# Patient Record
Sex: Male | Born: 1945 | Race: White | Hispanic: No
Health system: Southern US, Community
[De-identification: ages and names within clinical notes are randomized; demographics above are authoritative.]

## PROBLEM LIST (undated history)

## (undated) DIAGNOSIS — Z794 Long term (current) use of insulin: Secondary | ICD-10-CM

## (undated) DIAGNOSIS — I2510 Atherosclerotic heart disease of native coronary artery without angina pectoris: Secondary | ICD-10-CM

## (undated) DIAGNOSIS — Z95810 Presence of automatic (implantable) cardiac defibrillator: Secondary | ICD-10-CM

## (undated) DIAGNOSIS — E119 Type 2 diabetes mellitus without complications: Secondary | ICD-10-CM

---

## 2020-05-12 ENCOUNTER — Emergency Department: Payer: No Typology Code available for payment source

## 2020-05-12 ENCOUNTER — Inpatient Hospital Stay
Admission: EM | Admit: 2020-05-12 | Discharge: 2020-06-01 | DRG: 870 | Disposition: E | Payer: No Typology Code available for payment source | Attending: Internal Medicine | Admitting: Internal Medicine

## 2020-05-12 ENCOUNTER — Other Ambulatory Visit: Payer: Self-pay

## 2020-05-12 DIAGNOSIS — I361 Nonrheumatic tricuspid (valve) insufficiency: Secondary | ICD-10-CM | POA: Diagnosis not present

## 2020-05-12 DIAGNOSIS — R4182 Altered mental status, unspecified: Secondary | ICD-10-CM

## 2020-05-12 DIAGNOSIS — K567 Ileus, unspecified: Secondary | ICD-10-CM | POA: Diagnosis not present

## 2020-05-12 DIAGNOSIS — K922 Gastrointestinal hemorrhage, unspecified: Secondary | ICD-10-CM

## 2020-05-12 DIAGNOSIS — A4102 Sepsis due to Methicillin resistant Staphylococcus aureus: Principal | ICD-10-CM | POA: Diagnosis present

## 2020-05-12 DIAGNOSIS — E875 Hyperkalemia: Secondary | ICD-10-CM | POA: Diagnosis not present

## 2020-05-12 DIAGNOSIS — E876 Hypokalemia: Secondary | ICD-10-CM | POA: Diagnosis not present

## 2020-05-12 DIAGNOSIS — Z7189 Other specified counseling: Secondary | ICD-10-CM | POA: Diagnosis not present

## 2020-05-12 DIAGNOSIS — I130 Hypertensive heart and chronic kidney disease with heart failure and stage 1 through stage 4 chronic kidney disease, or unspecified chronic kidney disease: Secondary | ICD-10-CM | POA: Diagnosis present

## 2020-05-12 DIAGNOSIS — E870 Hyperosmolality and hypernatremia: Secondary | ICD-10-CM | POA: Diagnosis not present

## 2020-05-12 DIAGNOSIS — I4892 Unspecified atrial flutter: Secondary | ICD-10-CM | POA: Diagnosis not present

## 2020-05-12 DIAGNOSIS — M898X9 Other specified disorders of bone, unspecified site: Secondary | ICD-10-CM | POA: Diagnosis present

## 2020-05-12 DIAGNOSIS — Z66 Do not resuscitate: Secondary | ICD-10-CM | POA: Diagnosis not present

## 2020-05-12 DIAGNOSIS — R6521 Severe sepsis with septic shock: Secondary | ICD-10-CM | POA: Diagnosis present

## 2020-05-12 DIAGNOSIS — A4101 Sepsis due to Methicillin susceptible Staphylococcus aureus: Principal | ICD-10-CM | POA: Diagnosis present

## 2020-05-12 DIAGNOSIS — J9602 Acute respiratory failure with hypercapnia: Secondary | ICD-10-CM | POA: Diagnosis present

## 2020-05-12 DIAGNOSIS — D6489 Other specified anemias: Secondary | ICD-10-CM | POA: Diagnosis not present

## 2020-05-12 DIAGNOSIS — J15212 Pneumonia due to Methicillin resistant Staphylococcus aureus: Secondary | ICD-10-CM | POA: Diagnosis present

## 2020-05-12 DIAGNOSIS — E1110 Type 2 diabetes mellitus with ketoacidosis without coma: Secondary | ICD-10-CM | POA: Diagnosis not present

## 2020-05-12 DIAGNOSIS — Z95810 Presence of automatic (implantable) cardiac defibrillator: Secondary | ICD-10-CM

## 2020-05-12 DIAGNOSIS — R401 Stupor: Secondary | ICD-10-CM | POA: Diagnosis not present

## 2020-05-12 DIAGNOSIS — N2581 Secondary hyperparathyroidism of renal origin: Secondary | ICD-10-CM | POA: Diagnosis present

## 2020-05-12 DIAGNOSIS — Z20822 Contact with and (suspected) exposure to covid-19: Secondary | ICD-10-CM | POA: Diagnosis present

## 2020-05-12 DIAGNOSIS — I484 Atypical atrial flutter: Secondary | ICD-10-CM | POA: Diagnosis not present

## 2020-05-12 DIAGNOSIS — R680 Hypothermia, not associated with low environmental temperature: Secondary | ICD-10-CM | POA: Diagnosis present

## 2020-05-12 DIAGNOSIS — I248 Other forms of acute ischemic heart disease: Secondary | ICD-10-CM | POA: Diagnosis not present

## 2020-05-12 DIAGNOSIS — G9340 Encephalopathy, unspecified: Secondary | ICD-10-CM | POA: Diagnosis not present

## 2020-05-12 DIAGNOSIS — R6520 Severe sepsis without septic shock: Secondary | ICD-10-CM | POA: Diagnosis not present

## 2020-05-12 DIAGNOSIS — E1111 Type 2 diabetes mellitus with ketoacidosis with coma: Secondary | ICD-10-CM | POA: Diagnosis present

## 2020-05-12 DIAGNOSIS — E1122 Type 2 diabetes mellitus with diabetic chronic kidney disease: Secondary | ICD-10-CM | POA: Diagnosis present

## 2020-05-12 DIAGNOSIS — D62 Acute posthemorrhagic anemia: Secondary | ICD-10-CM | POA: Diagnosis present

## 2020-05-12 DIAGNOSIS — J9601 Acute respiratory failure with hypoxia: Secondary | ICD-10-CM | POA: Diagnosis present

## 2020-05-12 DIAGNOSIS — A419 Sepsis, unspecified organism: Secondary | ICD-10-CM

## 2020-05-12 DIAGNOSIS — I252 Old myocardial infarction: Secondary | ICD-10-CM

## 2020-05-12 DIAGNOSIS — I469 Cardiac arrest, cause unspecified: Secondary | ICD-10-CM | POA: Diagnosis not present

## 2020-05-12 DIAGNOSIS — N189 Chronic kidney disease, unspecified: Secondary | ICD-10-CM | POA: Diagnosis present

## 2020-05-12 DIAGNOSIS — N170 Acute kidney failure with tubular necrosis: Secondary | ICD-10-CM | POA: Diagnosis not present

## 2020-05-12 DIAGNOSIS — K921 Melena: Secondary | ICD-10-CM | POA: Diagnosis not present

## 2020-05-12 DIAGNOSIS — D649 Anemia, unspecified: Secondary | ICD-10-CM | POA: Diagnosis not present

## 2020-05-12 DIAGNOSIS — R7881 Bacteremia: Secondary | ICD-10-CM

## 2020-05-12 DIAGNOSIS — I5021 Acute systolic (congestive) heart failure: Secondary | ICD-10-CM | POA: Diagnosis not present

## 2020-05-12 DIAGNOSIS — G928 Other toxic encephalopathy: Secondary | ICD-10-CM | POA: Diagnosis not present

## 2020-05-12 DIAGNOSIS — J9600 Acute respiratory failure, unspecified whether with hypoxia or hypercapnia: Secondary | ICD-10-CM

## 2020-05-12 DIAGNOSIS — I483 Typical atrial flutter: Secondary | ICD-10-CM | POA: Diagnosis not present

## 2020-05-12 DIAGNOSIS — N179 Acute kidney failure, unspecified: Secondary | ICD-10-CM | POA: Diagnosis present

## 2020-05-12 DIAGNOSIS — J9690 Respiratory failure, unspecified, unspecified whether with hypoxia or hypercapnia: Secondary | ICD-10-CM

## 2020-05-12 DIAGNOSIS — R195 Other fecal abnormalities: Secondary | ICD-10-CM | POA: Diagnosis not present

## 2020-05-12 DIAGNOSIS — D631 Anemia in chronic kidney disease: Secondary | ICD-10-CM | POA: Diagnosis present

## 2020-05-12 DIAGNOSIS — E871 Hypo-osmolality and hyponatremia: Secondary | ICD-10-CM | POA: Diagnosis not present

## 2020-05-12 DIAGNOSIS — U071 COVID-19: Secondary | ICD-10-CM | POA: Diagnosis not present

## 2020-05-12 DIAGNOSIS — R0902 Hypoxemia: Secondary | ICD-10-CM

## 2020-05-12 DIAGNOSIS — E1165 Type 2 diabetes mellitus with hyperglycemia: Secondary | ICD-10-CM

## 2020-05-12 DIAGNOSIS — Z515 Encounter for palliative care: Secondary | ICD-10-CM | POA: Diagnosis not present

## 2020-05-12 DIAGNOSIS — Z6841 Body Mass Index (BMI) 40.0 and over, adult: Secondary | ICD-10-CM | POA: Diagnosis not present

## 2020-05-12 DIAGNOSIS — I340 Nonrheumatic mitral (valve) insufficiency: Secondary | ICD-10-CM | POA: Diagnosis not present

## 2020-05-12 DIAGNOSIS — D72829 Elevated white blood cell count, unspecified: Secondary | ICD-10-CM | POA: Diagnosis not present

## 2020-05-12 DIAGNOSIS — Z0189 Encounter for other specified special examinations: Secondary | ICD-10-CM

## 2020-05-12 DIAGNOSIS — G4733 Obstructive sleep apnea (adult) (pediatric): Secondary | ICD-10-CM | POA: Diagnosis present

## 2020-05-12 DIAGNOSIS — R1110 Vomiting, unspecified: Secondary | ICD-10-CM

## 2020-05-12 DIAGNOSIS — I2510 Atherosclerotic heart disease of native coronary artery without angina pectoris: Secondary | ICD-10-CM | POA: Diagnosis present

## 2020-05-12 DIAGNOSIS — R4020 Unspecified coma: Secondary | ICD-10-CM | POA: Diagnosis not present

## 2020-05-12 DIAGNOSIS — J189 Pneumonia, unspecified organism: Secondary | ICD-10-CM

## 2020-05-12 DIAGNOSIS — E0811 Diabetes mellitus due to underlying condition with ketoacidosis with coma: Secondary | ICD-10-CM | POA: Diagnosis not present

## 2020-05-12 DIAGNOSIS — B9561 Methicillin susceptible Staphylococcus aureus infection as the cause of diseases classified elsewhere: Secondary | ICD-10-CM | POA: Diagnosis not present

## 2020-05-12 DIAGNOSIS — Z01818 Encounter for other preprocedural examination: Secondary | ICD-10-CM

## 2020-05-12 DIAGNOSIS — J15211 Pneumonia due to Methicillin susceptible Staphylococcus aureus: Secondary | ICD-10-CM | POA: Diagnosis present

## 2020-05-12 DIAGNOSIS — E1100 Type 2 diabetes mellitus with hyperosmolarity without nonketotic hyperglycemic-hyperosmolar coma (NKHHC): Secondary | ICD-10-CM

## 2020-05-12 HISTORY — DX: Presence of automatic (implantable) cardiac defibrillator: Z95.810

## 2020-05-12 HISTORY — DX: Long term (current) use of insulin: Z79.4

## 2020-05-12 HISTORY — DX: Type 2 diabetes mellitus without complications: E11.9

## 2020-05-12 HISTORY — DX: Atherosclerotic heart disease of native coronary artery without angina pectoris: I25.10

## 2020-05-12 LAB — CBC WITH DIFFERENTIAL/PLATELET
Abs Immature Granulocytes: 0.69 10*3/uL — ABNORMAL HIGH (ref 0.00–0.07)
Basophils Absolute: 0 10*3/uL (ref 0.0–0.1)
Basophils Relative: 0 %
Eosinophils Absolute: 0 10*3/uL (ref 0.0–0.5)
Eosinophils Relative: 0 %
HCT: 22 % — ABNORMAL LOW (ref 39.0–52.0)
Hemoglobin: 7.1 g/dL — ABNORMAL LOW (ref 13.0–17.0)
Immature Granulocytes: 3 %
Lymphocytes Relative: 9 %
Lymphs Abs: 2.2 10*3/uL (ref 0.7–4.0)
MCH: 32.1 pg (ref 26.0–34.0)
MCHC: 32.3 g/dL (ref 30.0–36.0)
MCV: 99.5 fL (ref 80.0–100.0)
Monocytes Absolute: 1.6 10*3/uL — ABNORMAL HIGH (ref 0.1–1.0)
Monocytes Relative: 6 %
Neutro Abs: 21.6 10*3/uL — ABNORMAL HIGH (ref 1.7–7.7)
Neutrophils Relative %: 82 %
Platelets: 227 10*3/uL (ref 150–400)
RBC: 2.21 MIL/uL — ABNORMAL LOW (ref 4.22–5.81)
RDW: 14.6 % (ref 11.5–15.5)
Smear Review: NORMAL
WBC: 26.2 10*3/uL — ABNORMAL HIGH (ref 4.0–10.5)
nRBC: 0 % (ref 0.0–0.2)

## 2020-05-12 LAB — BLOOD GAS, VENOUS
Acid-base deficit: 13.3 mmol/L — ABNORMAL HIGH (ref 0.0–2.0)
Bicarbonate: 13.8 mmol/L — ABNORMAL LOW (ref 20.0–28.0)
O2 Saturation: 23.8 %
Patient temperature: 37
pCO2, Ven: 36 mmHg — ABNORMAL LOW (ref 44.0–60.0)
pH, Ven: 7.19 — CL (ref 7.250–7.430)
pO2, Ven: <31 mmHg — CL (ref 32.0–45.0)

## 2020-05-12 LAB — POC SARS CORONAVIRUS 2 AG -  ED: SARS Coronavirus 2 Ag: NEGATIVE

## 2020-05-12 LAB — APTT: aPTT: 25 seconds (ref 24–36)

## 2020-05-12 LAB — CBG MONITORING, ED
Glucose-Capillary: >600 mg/dL (ref 70–99)
Glucose-Capillary: >600 mg/dL (ref 70–99)

## 2020-05-12 LAB — LACTIC ACID, PLASMA: Lactic Acid, Venous: 5.8 mmol/L (ref 0.5–1.9)

## 2020-05-12 LAB — PROTIME-INR
INR: 1.4 — ABNORMAL HIGH (ref 0.8–1.2)
Prothrombin Time: 16.6 seconds — ABNORMAL HIGH (ref 11.4–15.2)

## 2020-05-12 MED ORDER — LACTATED RINGERS IV SOLN: INTRAVENOUS | Status: DC

## 2020-05-12 MED ORDER — METRONIDAZOLE IN NACL 5-0.79 MG/ML-% IV SOLN
500.0000 mg | Freq: Once | INTRAVENOUS | Status: AC
  Administered 2020-05-13: 500 mg via INTRAVENOUS
  Filled 2020-05-12: qty 100

## 2020-05-12 MED ORDER — VANCOMYCIN HCL 1500 MG/300ML IV SOLN
1500.0000 mg | Freq: Once | INTRAVENOUS | Status: AC
  Administered 2020-05-13: 1500 mg via INTRAVENOUS
  Filled 2020-05-12: qty 300

## 2020-05-12 MED ORDER — INSULIN REGULAR(HUMAN) IN NACL 100-0.9 UT/100ML-% IV SOLN
INTRAVENOUS | Status: DC
  Administered 2020-05-12: 15 [IU]/h via INTRAVENOUS
  Administered 2020-05-13: 8 [IU]/h via INTRAVENOUS
  Administered 2020-05-13: 15 [IU]/h via INTRAVENOUS
  Filled 2020-05-12 (×3): qty 100

## 2020-05-12 MED ORDER — DOCUSATE SODIUM 100 MG PO CAPS: 100.0000 mg | ORAL_CAPSULE | Freq: Two times a day (BID) | ORAL | Status: DC | PRN

## 2020-05-12 MED ORDER — VANCOMYCIN HCL IN DEXTROSE 1-5 GM/200ML-% IV SOLN: 1000.0000 mg | Freq: Once | INTRAVENOUS | Status: DC

## 2020-05-12 MED ORDER — DEXTROSE 50 % IV SOLN
0.0000 mL | INTRAVENOUS | Status: DC | PRN
  Administered 2020-05-16: 25 mL via INTRAVENOUS
  Administered 2020-05-22: 50 mL via INTRAVENOUS
  Filled 2020-05-12 (×2): qty 50

## 2020-05-12 MED ORDER — SODIUM CHLORIDE 0.9 % IV BOLUS
1000.0000 mL | Freq: Once | INTRAVENOUS | Status: AC
  Administered 2020-05-12: 1000 mL via INTRAVENOUS

## 2020-05-12 MED ORDER — SODIUM CHLORIDE 0.9 % IV SOLN
8.0000 mg/h | INTRAVENOUS | Status: DC
  Administered 2020-05-13: 01:00:00 8 mg/h via INTRAVENOUS
  Filled 2020-05-12: qty 80

## 2020-05-12 MED ORDER — VANCOMYCIN HCL IN DEXTROSE 1-5 GM/200ML-% IV SOLN
1000.0000 mg | Freq: Once | INTRAVENOUS | Status: DC
  Filled 2020-05-12: qty 200

## 2020-05-12 MED ORDER — POLYETHYLENE GLYCOL 3350 17 G PO PACK: 17.0000 g | PACK | Freq: Every day | ORAL | Status: DC | PRN

## 2020-05-12 MED ORDER — SODIUM CHLORIDE 0.9 % IV SOLN
2.0000 g | Freq: Once | INTRAVENOUS | Status: AC
  Administered 2020-05-12: 2 g via INTRAVENOUS
  Filled 2020-05-12: qty 2

## 2020-05-12 MED ORDER — SODIUM CHLORIDE 0.9 % IV SOLN: 10.0000 mL/h | Freq: Once | INTRAVENOUS | Status: DC

## 2020-05-12 MED ORDER — SODIUM CHLORIDE 0.9 % IV BOLUS
1000.0000 mL | Freq: Once | INTRAVENOUS | Status: AC
  Administered 2020-05-13: 1000 mL via INTRAVENOUS

## 2020-05-12 MED ORDER — SODIUM CHLORIDE 0.9 % IV SOLN
80.0000 mg | Freq: Once | INTRAVENOUS | Status: AC
  Administered 2020-05-13: 80 mg via INTRAVENOUS
  Filled 2020-05-12: qty 80

## 2020-05-12 MED ORDER — DEXTROSE IN LACTATED RINGERS 5 % IV SOLN: INTRAVENOUS | Status: DC

## 2020-05-12 MED ORDER — HEPARIN SODIUM (PORCINE) 5000 UNIT/ML IJ SOLN: 5000.0000 [IU] | Freq: Three times a day (TID) | INTRAMUSCULAR | Status: DC

## 2020-05-12 NOTE — H&P (Signed)
NAME:  Billy Barton, MRN:  [id number], DOB:  1945-06-02, LOS: 0 ADMISSION DATE:  06/03/20, CONSULTATION DATE:  June 03, 2020 REFERRING MD:  ED , CHIEF COMPLAINT:  Unresponsive   Brief History:  75yo male w/unknown past medical history who presented to the ED w/AMS and was found to be in DKA vs HSS with a blood sugar >600 and lactate 5.8  History of Present Illness:  75yo male w/unknown past medical history who presented to the ED via EMS after the son found him unresponsive, face down in the kitchen, incontinent of urine. Per the ED, the patient son reported to them they call and check on him daily and last spoke with him yesterday. When EMS arrived, the patient was awake but became unresponsive en route to the ED.   On arrival to the ED, the patient was arousable only to painful stimuli, hypothermic 94 otherwise stable vital signs. Labs revealed a blood glucose > 600, lactate 5.8, WBC 26.2, Hgb 7.1 and VBG: 7.19/36 and bicarb 13. He was given 1L LR bolus, started on endotool and antibiotics were initiated. Rapid COVID test negative  ICU was called for admission.   Past Medical History:  Unknown at this time, will need to speak with family when they arrive  Significant Hospital Events:  1/13-Admitted to ICU  Consults:  None  Procedures:  None   Significant Diagnostic Tests:  Rapid COVID negative  Micro Data:  Blood cultures sent 1/13: Pending  Antimicrobials:  Cefepime>>1/13 Flagyl>>1.13 Vancomycin >>1/13  Interim History / Subjective:  Arousable to voice and able to say name Confused Hypothermic w/bear hugger on VSS  Objective   Blood pressure (!) 136/52, pulse 71, temperature (!) 94.7 F (34.8 C), temperature source Rectal, resp. rate (!) 23, height 5\' 8"  (1.727 m), weight 127 kg, SpO2 97 %.       No intake or output data in the 24 hours ending 2020-06-03 2357 Filed Weights   06/03/20 2206  Weight: 127 kg    Examination: General: Lethargic but arousable HENT:  dry oral mucosa, normocephalic Lungs: Lungs clear throughout Cardiovascular: Heart sounds S1S2, +1 pitting edema Abdomen: soft, non-distended Extremities: warm, palpable pulses Neuro: arouses to voice, states his name, follows commands, confused to situation GU: No urine output yet. Foley to be placed  Resolved Hospital Problem list     Assessment & Plan:  Diabetic Ketoacidosis vs HSS --Blood glucose 1338 via BMP --UA pending --Received 1L LR in ED --Additional 1L LR given --LR at 150cc, add dextrose once BS <250 --Endotool --BMP, Lactates Q4hr --Monitor lytes  Metabolic acidosis --2/2 above --Trend ABG/Lactates --Fluids given --Will hold off on Bicarb administration for now  Hyperkalemia --K 6.7 --Receiving IV insulin at 15units/hr via endotool --Received IVFs --Trending BMP q4hr --No EKG changes at this time  Acute Kidney Injury --in the setting of HSS --Baseline Cr unknown --Cr 4.34 on admission --Continue aggressive fluid replacement --Avoid nephrotoxins --Monitor I&Os, place foley  Acute blood loss anemia --hgb 7.1 on arrival --Per ED, the did a stool guiac which was positive --ED ordered 2units of PRBC and started protonix gtt --Serial CBCs --Monitor for now, if starts to have active bleeding will need to consult GI  Leukocytosis --WBC 26.2 --Likely reactive --Given hypothermia and elevated lactate, patient was started on antibiotics in ED --Blood cultures pending --UA pending --De-escalate abxs pending clinical course and cultures    Best practice (evaluated daily)  Diet: NPO Pain/Anxiety/Delirium protocol (if indicated): N/A VAP protocol (if indicated): N/A DVT prophylaxis:  SCDs only, hold chemical prophylaxis given Acute blood loss anemia GI prophylaxis: Protonix gtt Glucose control: Endotool Mobility: PT consult in AM Disposition:ICU  Goals of Care:  Last date of multidisciplinary goals of care discussion:N/A Family and staff present:  N/A Summary of discussion: N/A Follow up goals of care discussion due: N/A Code Status: Full  Labs   CBC: Recent Labs  Lab 05/17/2020 2222  WBC 26.2*  NEUTROABS 21.6*  HGB 7.1*  HCT 22.0*  MCV 99.5  PLT 227    Basic Metabolic Panel: No results for input(s): NA, K, CL, CO2, GLUCOSE, BUN, CREATININE, CALCIUM, MG, PHOS in the last 168 hours. GFR: CrCl cannot be calculated (No successful lab value found.). Recent Labs  Lab 05/10/2020 2222  WBC 26.2*  LATICACIDVEN 5.8*    Liver Function Tests: No results for input(s): AST, ALT, ALKPHOS, BILITOT, PROT, ALBUMIN in the last 168 hours. No results for input(s): LIPASE, AMYLASE in the last 168 hours. No results for input(s): AMMONIA in the last 168 hours.  ABG    Component Value Date/Time   HCO3 13.8 (L) May 31, 2020 2222   ACIDBASEDEF 13.3 (H) May 31, 2020 2222   O2SAT 23.8 05/20/2020 2222     Coagulation Profile: Recent Labs  Lab 05/04/2020 2222  INR 1.4*    Cardiac Enzymes: No results for input(s): CKTOTAL, CKMB, CKMBINDEX, TROPONINI in the last 168 hours.  HbA1C: No results found for: HGBA1C  CBG: Recent Labs  Lab 05/17/2020 2211 May 31, 2020 2335  GLUCAP >600* >600*    Review of Systems:   Unable to assess due to clinical condition  Past Medical History:  He,  has no past medical history on file.   Surgical History:  Unknown   Social History:      Family History:  His family history is not on file.   Allergies Not on File   Home Medications  Prior to Admission medications   Not on File     Critical care time: 51 minutes

## 2020-05-12 NOTE — Sepsis Progress Note (Signed)
Following for sepsis monitoring ?

## 2020-05-12 NOTE — ED Triage Notes (Signed)
PT brought in by [facility] from home. Home states he was found unresponsive 1 hr pta by son. Last time he spoke to him was light night. He as found face down in kitchen and had been incontinent of urine. Per ems pt was awake but became unresponsive enroute. CBG high per EMS hx of diabetes.

## 2020-05-12 NOTE — H&P (Incomplete)
NAME:  Billy Barton, MRN:  [id number], DOB:  June 09, 1945, LOS: 0 ADMISSION DATE:  05/16/2020, CONSULTATION DATE:   REFERRING MD:  ***, CHIEF COMPLAINT:  ***   Brief History:  75yo male w/unknown past medical history who presented to the ED w/AMS and was found to be in DKA with a blood sugar >600 and lactate 5.8  History of Present Illness:  75yo male w/unknown past medical history who presented to the ED via EMS after the son found him unresponsive, face down in the kitchen, incontinent of urine. Per the ED, the patient son reported to them they call and check on him daily and last spoke with him yesterday. When EMS arrived, the patient was awake but became unresponsive en route to the ED.   On arrival to the ED, the patient was arousable only to painful stimuli, hypothermic  Past Medical History:  ***  Significant Hospital Events:  ***  Consults:  ***  Procedures:  ***  Significant Diagnostic Tests:  ***  Micro Data:  ***  Antimicrobials:  ***   Interim History / Subjective:  ***  Objective   Blood pressure (!) 136/52, pulse 71, temperature (!) 94.7 F (34.8 C), temperature source Rectal, resp. rate (!) 23, height 5\' 8"  (1.727 m), weight 127 kg, SpO2 97 %.       No intake or output data in the 24 hours ending 05/22/2020 2357 Filed Weights   05/24/2020 2206  Weight: 127 kg    Examination: General: *** HENT: *** Lungs: *** Cardiovascular: *** Abdomen: *** Extremities: *** Neuro: *** GU: ***  Resolved Hospital Problem list   ***  Assessment & Plan:  ***  Best practice (evaluated daily)  Diet: *** Pain/Anxiety/Delirium protocol (if indicated): *** VAP protocol (if indicated): *** DVT prophylaxis: *** GI prophylaxis: *** Glucose control: *** Mobility: *** Disposition:***  Goals of Care:  Last date of multidisciplinary goals of care discussion:*** Family and staff present: *** Summary of discussion: *** Follow up goals of care discussion due:  *** Code Status: ***  Labs   CBC: Recent Labs  Lab 05/14/2020 2222  WBC 26.2*  NEUTROABS 21.6*  HGB 7.1*  HCT 22.0*  MCV 99.5  PLT 227    Basic Metabolic Panel: No results for input(s): NA, K, CL, CO2, GLUCOSE, BUN, CREATININE, CALCIUM, MG, PHOS in the last 168 hours. GFR: CrCl cannot be calculated (No successful lab value found.). Recent Labs  Lab 05/19/2020 2222  WBC 26.2*  LATICACIDVEN 5.8*    Liver Function Tests: No results for input(s): AST, ALT, ALKPHOS, BILITOT, PROT, ALBUMIN in the last 168 hours. No results for input(s): LIPASE, AMYLASE in the last 168 hours. No results for input(s): AMMONIA in the last 168 hours.  ABG    Component Value Date/Time   HCO3 13.8 (L) 05/29/2020 2222   ACIDBASEDEF 13.3 (H) 05/21/2020 2222   O2SAT 23.8 05/06/2020 2222     Coagulation Profile: Recent Labs  Lab 05/16/2020 2222  INR 1.4*    Cardiac Enzymes: No results for input(s): CKTOTAL, CKMB, CKMBINDEX, TROPONINI in the last 168 hours.  HbA1C: No results found for: HGBA1C  CBG: Recent Labs  Lab 05/24/2020 2211 05/04/2020 2335  GLUCAP >600* >600*    Review of Systems:   ***  Past Medical History:  He,  has no past medical history on file.   Surgical History:  *** The histories are not reviewed yet. Please review them in the "History" navigator section and refresh this SmartLink.   Social  History:      Family History:  His family history is not on file.   Allergies Not on File   Home Medications  Prior to Admission medications   Not on File     Critical care time: ***

## 2020-05-12 NOTE — ED Provider Notes (Addendum)
[facility] Emergency Department Provider Note  Time seen: 10:04 PM  I have reviewed the triage vital signs and the nursing notes.   HISTORY  Chief Complaint Altered Mental Status   HPI Billy Barton is a 75 y.o. male with no known past medical history besides diabetes reported by EMS presents to the emergency department for unresponsiveness.  According to EMS patient lives alone, son last spoke to the patient last night and the patient was normal.  Son did not hear from the patient today to check on him and found him on the floor appeared to be confused and covered in urine.  Per EMS patient initially able to answer questions although seems confused, in route to the hospital appeared to go unresponsive so they presented to the emergency department via emergency traffic.  Upon arrival patient is extremely somnolent however did awaken to mild physical stimulation.  Once awake patient was able to answer some questions but was confused.  Denied any pain but could not tell me where he was.  Patient falls asleep if not actively engaged.  No past medical history on file.  There are no problems to display for this patient.   Prior to Admission medications   Not on File    Not on File  No family history on file.  Social History    Review of Systems Unable to obtain adequate/accurate review of systems secondary to confusion/altered mental status.  ____________________________________________   PHYSICAL EXAM:  VITAL SIGNS:  Constitutional: Patient is somnolent but does awaken to mild physical stimuli.  Once awake will answer some questions but is very confused and falls asleep if not being actively engaged.  Patient is covered in urine with strong urine smell. Eyes: Normal exam ENT      Head: Normocephalic and atraumatic.      Mouth/Throat: Mucous membranes are moist. Cardiovascular: Normal rate, regular rhythm.  Respiratory: Normal respiratory effort  without tachypnea nor retractions. Breath sounds are clear.  No obvious wheeze rales or rhonchi. Gastrointestinal: Soft and nontender. No distention.  Obese. Musculoskeletal: Atraumatic extremities. Neurologic: Somnolent.  Unable to complete adequate neurologic testing due to altered mental status Skin:  Skin is cool, dry.   Psychiatric: Altered mental status/confusion. ____________________________________________    EKG  EKG viewed and interpreted by myself shows what appears to be a sinus rhythm at 70 bpm with a narrow QRS left axis deviation large and normal intervals with nonspecific ST changes no ST elevation.  ____________________________________________    RADIOLOGY  Chest x-ray is negative for acute abnormality.  ____________________________________________   INITIAL IMPRESSION / ASSESSMENT AND PLAN / ED COURSE  Pertinent labs & imaging results that were available during my care of the patient were reviewed by me and considered in my medical decision making (see chart for details).   Patient presents emergency department via EMS from home for altered mental status.  Patient normally lives alone and is able to care for himself.  Son states patient was normal last night but was not responding to him today so he stopped by the hospital the patient on the floor covered in urine and confused.  Upon arrival patient is somnolent and confused does awaken to mild physical stimuli will answer questions but is confused and will fall asleep if not actively engaged.  EMS reports a CBG of high.  Point-of-care blood glucose in the emergency department is greater than 600.  We will start IV fluids.  We will check labs, lactic acid, urine,  chest x-ray, CT scan of the head.  We will continue to closely monitor in the emergency department.  Patient will require admission once his emergency department work-up is been completed.  Patient is lab work has begun to result showing significant  hyperglycemia with VBG pH is 7.19 indicating DKA versus HHS.  Patient also has significant leukocytosis along with his initial near hypothermia and tachypnea we will cover with antibiotics as the patient meets sepsis criteria.  Patient's rapid COVID is negative we will send a PCR.  Patient has been started on insulin infusion, IV fluids, IV antibiotics.  I spoke to ICU they will be admitting to their service.  Patient is hemoglobin noted to return at 7.1.  Rectal exam shows black stool strongly guaiac positive.  No old labs for comparison.  We will order 2 units of packed red blood cells start the patient on Protonix infusion.  I spoke to the patient's son over the phone and updated him on the patient's status.  Patient is a full code.  Son does not know of any blood thinners or anticoagulation that the patient is taking.  Patient will be admitted to the intensive care unit.  Billy Barton was evaluated in Emergency Department on 05/10/2020 for the symptoms described in the history of present illness. He was evaluated in the context of the global COVID-19 pandemic, which necessitated consideration that the patient might be at risk for infection with the SARS-CoV-2 virus that causes COVID-19. Institutional protocols and algorithms that pertain to the evaluation of patients at risk for COVID-19 are in a state of rapid change based on information released by regulatory bodies including the CDC and federal and state organizations. These policies and algorithms were followed during the patient's care in the ED.  CRITICAL CARE Performed by: Minna Antis   Total critical care time: 80 minutes  Critical care time was exclusive of separately billable procedures and treating other patients.  Critical care was necessary to treat or prevent imminent or life-threatening deterioration.  Critical care was time spent personally by me on the following activities: development of treatment plan with patient and/or  surrogate as well as nursing, discussions with consultants, evaluation of patient's response to treatment, examination of patient, obtaining history from patient or surrogate, ordering and performing treatments and interventions, ordering and review of laboratory studies, ordering and review of radiographic studies, pulse oximetry and re-evaluation of patient's condition.  ____________________________________________   FINAL CLINICAL IMPRESSION(S) / ED DIAGNOSES  Altered mental status   Minna Antis, MD 05/14/2020 2344    Minna Antis, MD 05/13/20 0001

## 2020-05-12 NOTE — Progress Notes (Signed)
PHARMACY -  BRIEF ANTIBIOTIC NOTE   Pharmacy has received consult(s) for Vancomycin, Cefepime from an ED provider.  The patient's profile has been reviewed for ht/wt/allergies/indication/available labs.    One time order(s) placed for Vancomycin 2500 mg IV X 1 and Cefepime 2 gm IV X 1.   Further antibiotics/pharmacy consults should be ordered by admitting physician if indicated.                       Thank you, Gertie Broerman D 05/10/2020  11:15 PM

## 2020-05-12 NOTE — Progress Notes (Signed)
CODE SEPSIS - PHARMACY COMMUNICATION  **Broad Spectrum Antibiotics should be administered within 1 hour of Sepsis diagnosis**  Time Code Sepsis Called/Page Received: 2310  Antibiotics Ordered: Cefepime, Flagyl, Vancomycin  Time of 1st antibiotic administration: 2341  Otelia Sergeant, PharmD, [place] 05/13/2020 12:10 AM

## 2020-05-13 ENCOUNTER — Encounter: Payer: Self-pay | Admitting: Internal Medicine

## 2020-05-13 ENCOUNTER — Inpatient Hospital Stay: Payer: No Typology Code available for payment source

## 2020-05-13 DIAGNOSIS — E875 Hyperkalemia: Secondary | ICD-10-CM

## 2020-05-13 DIAGNOSIS — E871 Hypo-osmolality and hyponatremia: Secondary | ICD-10-CM

## 2020-05-13 DIAGNOSIS — E1111 Type 2 diabetes mellitus with ketoacidosis with coma: Secondary | ICD-10-CM

## 2020-05-13 DIAGNOSIS — G9340 Encephalopathy, unspecified: Secondary | ICD-10-CM | POA: Diagnosis not present

## 2020-05-13 DIAGNOSIS — B9561 Methicillin susceptible Staphylococcus aureus infection as the cause of diseases classified elsewhere: Secondary | ICD-10-CM | POA: Diagnosis not present

## 2020-05-13 DIAGNOSIS — R7881 Bacteremia: Secondary | ICD-10-CM

## 2020-05-13 LAB — URINALYSIS, COMPLETE (UACMP) WITH MICROSCOPIC
Bacteria, UA: NONE SEEN
Bilirubin Urine: NEGATIVE
Glucose, UA: >=500 mg/dL — AB
Ketones, ur: NEGATIVE mg/dL
Leukocytes,Ua: NEGATIVE
Nitrite: NEGATIVE
Protein, ur: 100 mg/dL — AB
Specific Gravity, Urine: 1.021 (ref 1.005–1.030)
pH: 5 (ref 5.0–8.0)

## 2020-05-13 LAB — COMPREHENSIVE METABOLIC PANEL
ALT: 11 U/L (ref 0–44)
AST: 24 U/L (ref 15–41)
Albumin: 2.5 g/dL — ABNORMAL LOW (ref 3.5–5.0)
Alkaline Phosphatase: 68 U/L (ref 38–126)
Anion gap: 16 — ABNORMAL HIGH (ref 5–15)
BUN: 162 mg/dL — ABNORMAL HIGH (ref 8–23)
CO2: 13 mmol/L — ABNORMAL LOW (ref 22–32)
Calcium: 8.8 mg/dL — ABNORMAL LOW (ref 8.9–10.3)
Chloride: 97 mmol/L — ABNORMAL LOW (ref 98–111)
Creatinine, Ser: 4.34 mg/dL — ABNORMAL HIGH (ref 0.61–1.24)
GFR, Estimated: 14 mL/min — ABNORMAL LOW (ref >60)
Glucose, Bld: 1346 mg/dL (ref 70–99)
Potassium: 6.7 mmol/L (ref 3.5–5.1)
Sodium: 126 mmol/L — ABNORMAL LOW (ref 135–145)
Total Bilirubin: 0.9 mg/dL (ref 0.3–1.2)
Total Protein: 5.2 g/dL — ABNORMAL LOW (ref 6.5–8.1)

## 2020-05-13 LAB — GLUCOSE, CAPILLARY
Glucose-Capillary: >600 mg/dL (ref 70–99)
Glucose-Capillary: >600 mg/dL (ref 70–99)
Glucose-Capillary: >600 mg/dL (ref 70–99)
Glucose-Capillary: >600 mg/dL (ref 70–99)
Glucose-Capillary: >600 mg/dL (ref 70–99)
Glucose-Capillary: 590 mg/dL (ref 70–99)
Glucose-Capillary: 558 mg/dL (ref 70–99)
Glucose-Capillary: 416 mg/dL — ABNORMAL HIGH (ref 70–99)
Glucose-Capillary: 398 mg/dL — ABNORMAL HIGH (ref 70–99)
Glucose-Capillary: 302 mg/dL — ABNORMAL HIGH (ref 70–99)
Glucose-Capillary: 226 mg/dL — ABNORMAL HIGH (ref 70–99)
Glucose-Capillary: 172 mg/dL — ABNORMAL HIGH (ref 70–99)
Glucose-Capillary: 189 mg/dL — ABNORMAL HIGH (ref 70–99)
Glucose-Capillary: 158 mg/dL — ABNORMAL HIGH (ref 70–99)
Glucose-Capillary: 186 mg/dL — ABNORMAL HIGH (ref 70–99)
Glucose-Capillary: 174 mg/dL — ABNORMAL HIGH (ref 70–99)
Glucose-Capillary: 176 mg/dL — ABNORMAL HIGH (ref 70–99)
Glucose-Capillary: 178 mg/dL — ABNORMAL HIGH (ref 70–99)

## 2020-05-13 LAB — RESP PANEL BY RT-PCR (FLU A&B, COVID) ARPGX2
Influenza A by PCR: NEGATIVE
Influenza B by PCR: NEGATIVE
SARS Coronavirus 2 by RT PCR: NEGATIVE

## 2020-05-13 LAB — BASIC METABOLIC PANEL
Anion gap: 14 (ref 5–15)
Anion gap: 13 (ref 5–15)
Anion gap: 11 (ref 5–15)
Anion gap: 12 (ref 5–15)
Anion gap: 16 — ABNORMAL HIGH (ref 5–15)
Anion gap: 14 (ref 5–15)
BUN: 168 mg/dL — ABNORMAL HIGH (ref 8–23)
BUN: 147 mg/dL — ABNORMAL HIGH (ref 8–23)
BUN: 147 mg/dL — ABNORMAL HIGH (ref 8–23)
BUN: 148 mg/dL — ABNORMAL HIGH (ref 8–23)
BUN: 138 mg/dL — ABNORMAL HIGH (ref 8–23)
BUN: 130 mg/dL — ABNORMAL HIGH (ref 8–23)
CO2: 14 mmol/L — ABNORMAL LOW (ref 22–32)
CO2: 14 mmol/L — ABNORMAL LOW (ref 22–32)
CO2: 16 mmol/L — ABNORMAL LOW (ref 22–32)
CO2: 18 mmol/L — ABNORMAL LOW (ref 22–32)
CO2: 20 mmol/L — ABNORMAL LOW (ref 22–32)
CO2: 22 mmol/L (ref 22–32)
Calcium: 8.4 mg/dL — ABNORMAL LOW (ref 8.9–10.3)
Calcium: 10.3 mg/dL (ref 8.9–10.3)
Calcium: 9.3 mg/dL (ref 8.9–10.3)
Calcium: 9.2 mg/dL (ref 8.9–10.3)
Calcium: 9 mg/dL (ref 8.9–10.3)
Calcium: 8.6 mg/dL — ABNORMAL LOW (ref 8.9–10.3)
Chloride: 102 mmol/L (ref 98–111)
Chloride: 108 mmol/L (ref 98–111)
Chloride: 111 mmol/L (ref 98–111)
Chloride: 112 mmol/L — ABNORMAL HIGH (ref 98–111)
Chloride: 112 mmol/L — ABNORMAL HIGH (ref 98–111)
Chloride: 111 mmol/L (ref 98–111)
Creatinine, Ser: 4.15 mg/dL — ABNORMAL HIGH (ref 0.61–1.24)
Creatinine, Ser: 3.83 mg/dL — ABNORMAL HIGH (ref 0.61–1.24)
Creatinine, Ser: 3.5 mg/dL — ABNORMAL HIGH (ref 0.61–1.24)
Creatinine, Ser: 3.86 mg/dL — ABNORMAL HIGH (ref 0.61–1.24)
Creatinine, Ser: 4.04 mg/dL — ABNORMAL HIGH (ref 0.61–1.24)
Creatinine, Ser: 4.26 mg/dL — ABNORMAL HIGH (ref 0.61–1.24)
GFR, Estimated: 14 mL/min — ABNORMAL LOW (ref >60)
GFR, Estimated: 16 mL/min — ABNORMAL LOW (ref >60)
GFR, Estimated: 18 mL/min — ABNORMAL LOW (ref >60)
GFR, Estimated: 16 mL/min — ABNORMAL LOW (ref >60)
GFR, Estimated: 15 mL/min — ABNORMAL LOW (ref >60)
GFR, Estimated: 14 mL/min — ABNORMAL LOW (ref >60)
Glucose, Bld: 1183 mg/dL (ref 70–99)
Glucose, Bld: 963 mg/dL (ref 70–99)
Glucose, Bld: 684 mg/dL (ref 70–99)
Glucose, Bld: 354 mg/dL — ABNORMAL HIGH (ref 70–99)
Glucose, Bld: 195 mg/dL — ABNORMAL HIGH (ref 70–99)
Glucose, Bld: 192 mg/dL — ABNORMAL HIGH (ref 70–99)
Potassium: 4.5 mmol/L (ref 3.5–5.1)
Potassium: 3.7 mmol/L (ref 3.5–5.1)
Potassium: 3.5 mmol/L (ref 3.5–5.1)
Potassium: 3.1 mmol/L — ABNORMAL LOW (ref 3.5–5.1)
Potassium: 3 mmol/L — ABNORMAL LOW (ref 3.5–5.1)
Potassium: 3.3 mmol/L — ABNORMAL LOW (ref 3.5–5.1)
Sodium: 130 mmol/L — ABNORMAL LOW (ref 135–145)
Sodium: 135 mmol/L (ref 135–145)
Sodium: 138 mmol/L (ref 135–145)
Sodium: 142 mmol/L (ref 135–145)
Sodium: 148 mmol/L — ABNORMAL HIGH (ref 135–145)
Sodium: 147 mmol/L — ABNORMAL HIGH (ref 135–145)

## 2020-05-13 LAB — CBC
HCT: 19.2 % — ABNORMAL LOW (ref 39.0–52.0)
HCT: 19.8 % — ABNORMAL LOW (ref 39.0–52.0)
HCT: 20.7 % — ABNORMAL LOW (ref 39.0–52.0)
HCT: 21.4 % — ABNORMAL LOW (ref 39.0–52.0)
HCT: 21.6 % — ABNORMAL LOW (ref 39.0–52.0)
HCT: 22.4 % — ABNORMAL LOW (ref 39.0–52.0)
HCT: 22.7 % — ABNORMAL LOW (ref 39.0–52.0)
Hemoglobin: 6.5 g/dL — ABNORMAL LOW (ref 13.0–17.0)
Hemoglobin: 6.8 g/dL — ABNORMAL LOW (ref 13.0–17.0)
Hemoglobin: 6.9 g/dL — ABNORMAL LOW (ref 13.0–17.0)
Hemoglobin: 7.3 g/dL — ABNORMAL LOW (ref 13.0–17.0)
Hemoglobin: 7.5 g/dL — ABNORMAL LOW (ref 13.0–17.0)
Hemoglobin: 7.6 g/dL — ABNORMAL LOW (ref 13.0–17.0)
Hemoglobin: 8 g/dL — ABNORMAL LOW (ref 13.0–17.0)
MCH: 30.6 pg (ref 26.0–34.0)
MCH: 30.8 pg (ref 26.0–34.0)
MCH: 30.9 pg (ref 26.0–34.0)
MCH: 31.3 pg (ref 26.0–34.0)
MCH: 31.9 pg (ref 26.0–34.0)
MCH: 32.5 pg (ref 26.0–34.0)
MCH: 32.7 pg (ref 26.0–34.0)
MCHC: 33.3 g/dL (ref 30.0–36.0)
MCHC: 33.9 g/dL (ref 30.0–36.0)
MCHC: 33.9 g/dL (ref 30.0–36.0)
MCHC: 34.1 g/dL (ref 30.0–36.0)
MCHC: 34.3 g/dL (ref 30.0–36.0)
MCHC: 34.7 g/dL (ref 30.0–36.0)
MCHC: 35.2 g/dL (ref 30.0–36.0)
MCV: 90 fL (ref 80.0–100.0)
MCV: 90 fL (ref 80.0–100.0)
MCV: 90.3 fL (ref 80.0–100.0)
MCV: 90.3 fL (ref 80.0–100.0)
MCV: 90.4 fL (ref 80.0–100.0)
MCV: 96 fL (ref 80.0–100.0)
MCV: 98.1 fL (ref 80.0–100.0)
Platelets: 149 10*3/uL — ABNORMAL LOW (ref 150–400)
Platelets: 167 10*3/uL (ref 150–400)
Platelets: 195 10*3/uL (ref 150–400)
Platelets: 204 10*3/uL (ref 150–400)
Platelets: 215 10*3/uL (ref 150–400)
Platelets: 217 10*3/uL (ref 150–400)
Platelets: 221 10*3/uL (ref 150–400)
RBC: 2 MIL/uL — ABNORMAL LOW (ref 4.22–5.81)
RBC: 2.11 MIL/uL — ABNORMAL LOW (ref 4.22–5.81)
RBC: 2.2 MIL/uL — ABNORMAL LOW (ref 4.22–5.81)
RBC: 2.37 MIL/uL — ABNORMAL LOW (ref 4.22–5.81)
RBC: 2.4 MIL/uL — ABNORMAL LOW (ref 4.22–5.81)
RBC: 2.48 MIL/uL — ABNORMAL LOW (ref 4.22–5.81)
RBC: 2.51 MIL/uL — ABNORMAL LOW (ref 4.22–5.81)
RDW: 14 % (ref 11.5–15.5)
RDW: 14.5 % (ref 11.5–15.5)
RDW: 15.5 % (ref 11.5–15.5)
RDW: 15.9 % — ABNORMAL HIGH (ref 11.5–15.5)
RDW: 16.4 % — ABNORMAL HIGH (ref 11.5–15.5)
RDW: 16.5 % — ABNORMAL HIGH (ref 11.5–15.5)
RDW: 16.7 % — ABNORMAL HIGH (ref 11.5–15.5)
WBC: 21.4 10*3/uL — ABNORMAL HIGH (ref 4.0–10.5)
WBC: 22.4 10*3/uL — ABNORMAL HIGH (ref 4.0–10.5)
WBC: 25.6 10*3/uL — ABNORMAL HIGH (ref 4.0–10.5)
WBC: 27.2 10*3/uL — ABNORMAL HIGH (ref 4.0–10.5)
WBC: 29.4 10*3/uL — ABNORMAL HIGH (ref 4.0–10.5)
WBC: 30.4 10*3/uL — ABNORMAL HIGH (ref 4.0–10.5)
WBC: 31.2 10*3/uL — ABNORMAL HIGH (ref 4.0–10.5)
nRBC: 0 % (ref 0.0–0.2)
nRBC: 0.1 % (ref 0.0–0.2)
nRBC: 0.1 % (ref 0.0–0.2)
nRBC: 0.2 % (ref 0.0–0.2)
nRBC: 0.2 % (ref 0.0–0.2)
nRBC: 0.2 % (ref 0.0–0.2)
nRBC: 0.4 % — ABNORMAL HIGH (ref 0.0–0.2)

## 2020-05-13 LAB — BLOOD CULTURE ID PANEL (REFLEXED) - BCID2

## 2020-05-13 LAB — BLOOD GAS, ARTERIAL
Acid-base deficit: 13.8 mmol/L — ABNORMAL HIGH (ref 0.0–2.0)
Acid-base deficit: 7.2 mmol/L — ABNORMAL HIGH (ref 0.0–2.0)
Allens test (pass/fail): POSITIVE — AB
Bicarbonate: 13.4 mmol/L — ABNORMAL LOW (ref 20.0–28.0)
Bicarbonate: 17.8 mmol/L — ABNORMAL LOW (ref 20.0–28.0)
FIO2: 1
FIO2: 0.35
MECHVT: 500 mL
O2 Saturation: 99.8 %
O2 Saturation: 96.7 %
PEEP: 5 cmH2O
Patient temperature: 37
Patient temperature: 37
RATE: 16 resp/min
pCO2 arterial: 36 mmHg (ref 32.0–48.0)
pCO2 arterial: 33 mmHg (ref 32.0–48.0)
pH, Arterial: 7.18 — CL (ref 7.350–7.450)
pH, Arterial: 7.34 — ABNORMAL LOW (ref 7.350–7.450)
pO2, Arterial: 276 mmHg — ABNORMAL HIGH (ref 83.0–108.0)
pO2, Arterial: 93 mmHg (ref 83.0–108.0)

## 2020-05-13 LAB — BLOOD GAS, VENOUS
Acid-base deficit: 2.6 mmol/L — ABNORMAL HIGH (ref 0.0–2.0)
Bicarbonate: 22.6 mmol/L (ref 20.0–28.0)
FIO2: 0.35
MECHVT: 500 mL
O2 Saturation: 62.5 %
PEEP: 5 cmH2O
Patient temperature: 37
RATE: 16 resp/min
pCO2, Ven: 40 mmHg — ABNORMAL LOW (ref 44.0–60.0)
pH, Ven: 7.36 (ref 7.250–7.430)
pO2, Ven: 34 mmHg (ref 32.0–45.0)

## 2020-05-13 LAB — TROPONIN I (HIGH SENSITIVITY)
Troponin I (High Sensitivity): 47 ng/L — ABNORMAL HIGH (ref <18)
Troponin I (High Sensitivity): 50 ng/L — ABNORMAL HIGH (ref <18)
Troponin I (High Sensitivity): 52 ng/L — ABNORMAL HIGH (ref <18)
Troponin I (High Sensitivity): 67 ng/L — ABNORMAL HIGH (ref <18)

## 2020-05-13 LAB — LACTIC ACID, PLASMA
Lactic Acid, Venous: 2.8 mmol/L (ref 0.5–1.9)
Lactic Acid, Venous: 3.4 mmol/L (ref 0.5–1.9)
Lactic Acid, Venous: 2 mmol/L (ref 0.5–1.9)

## 2020-05-13 LAB — PROTIME-INR
INR: 1.5 — ABNORMAL HIGH (ref 0.8–1.2)
Prothrombin Time: 17.1 seconds — ABNORMAL HIGH (ref 11.4–15.2)

## 2020-05-13 LAB — PREPARE RBC (CROSSMATCH)

## 2020-05-13 LAB — CBG MONITORING, ED: Glucose-Capillary: >600 mg/dL (ref 70–99)

## 2020-05-13 LAB — BETA-HYDROXYBUTYRIC ACID: Beta-Hydroxybutyric Acid: 0.24 mmol/L (ref 0.05–0.27)

## 2020-05-13 LAB — APTT: aPTT: <24 seconds — ABNORMAL LOW (ref 24–36)

## 2020-05-13 LAB — OCCULT BLOOD X 1 CARD TO LAB, STOOL: Fecal Occult Bld: POSITIVE — AB

## 2020-05-13 LAB — TRIGLYCERIDES: Triglycerides: 386 mg/dL — ABNORMAL HIGH (ref <150)

## 2020-05-13 LAB — ABO/RH: ABO/RH(D): O NEG

## 2020-05-13 LAB — CK: Total CK: 149 U/L (ref 49–397)

## 2020-05-13 LAB — ETHANOL: Alcohol, Ethyl (B): <10 mg/dL (ref <10)

## 2020-05-13 MED ORDER — VECURONIUM BROMIDE 10 MG IV SOLR: 10.0000 mg | INTRAVENOUS | Status: DC | PRN

## 2020-05-13 MED ORDER — MIDAZOLAM HCL 2 MG/2ML IJ SOLN: 2.0000 mg | Freq: Once | INTRAMUSCULAR | Status: DC

## 2020-05-13 MED ORDER — SODIUM CHLORIDE 0.9% IV SOLUTION: Freq: Once | INTRAVENOUS | Status: AC

## 2020-05-13 MED ORDER — CHLORHEXIDINE GLUCONATE 0.12% ORAL RINSE (MEDLINE KIT)
15.0000 mL | Freq: Two times a day (BID) | OROMUCOSAL | Status: DC
  Administered 2020-05-13 – 2020-05-31 (×37): 15 mL via OROMUCOSAL

## 2020-05-13 MED ORDER — VASOPRESSIN 20 UNITS/100 ML INFUSION FOR SHOCK
0.0000 [IU]/min | INTRAVENOUS | Status: DC
  Administered 2020-05-13 – 2020-05-14 (×4): 0.03 [IU]/min via INTRAVENOUS
  Filled 2020-05-13 (×5): qty 100

## 2020-05-13 MED ORDER — NOREPINEPHRINE 4 MG/250ML-% IV SOLN: 0.0000 ug/min | INTRAVENOUS | Status: DC

## 2020-05-13 MED ORDER — MIDAZOLAM HCL 2 MG/2ML IJ SOLN
INTRAMUSCULAR | Status: AC
  Administered 2020-05-13: 2 mg via INTRAVENOUS
  Filled 2020-05-13: qty 2

## 2020-05-13 MED ORDER — VECURONIUM BROMIDE 10 MG IV SOLR: 10.0000 mg | Freq: Once | INTRAVENOUS | Status: AC

## 2020-05-13 MED ORDER — NOREPINEPHRINE 4 MG/250ML-% IV SOLN
INTRAVENOUS | Status: AC
  Administered 2020-05-13: 4 ug/min via INTRAVENOUS
  Filled 2020-05-13: qty 250

## 2020-05-13 MED ORDER — VECURONIUM BROMIDE 10 MG IV SOLR
INTRAVENOUS | Status: AC
  Administered 2020-05-13: 10 mg via INTRAVENOUS
  Filled 2020-05-13: qty 10

## 2020-05-13 MED ORDER — PROPOFOL 1000 MG/100ML IV EMUL: 5.0000 ug/kg/min | INTRAVENOUS | Status: DC

## 2020-05-13 MED ORDER — POTASSIUM CHLORIDE 20 MEQ PO PACK
40.0000 meq | PACK | Freq: Once | ORAL | Status: AC
  Administered 2020-05-13: 40 meq
  Filled 2020-05-13: qty 2

## 2020-05-13 MED ORDER — NOREPINEPHRINE 16 MG/250ML-% IV SOLN
0.0000 ug/min | INTRAVENOUS | Status: DC
  Administered 2020-05-13: 08:00:00 6 ug/min via INTRAVENOUS
  Administered 2020-05-13: 15 ug/min via INTRAVENOUS
  Filled 2020-05-13 (×2): qty 250

## 2020-05-13 MED ORDER — ETOMIDATE 2 MG/ML IV SOLN
INTRAVENOUS | Status: AC
  Administered 2020-05-13: 20 mg via INTRAVENOUS
  Filled 2020-05-13: qty 10

## 2020-05-13 MED ORDER — INSULIN DETEMIR 100 UNIT/ML ~~LOC~~ SOLN
34.0000 [IU] | Freq: Two times a day (BID) | SUBCUTANEOUS | Status: DC
  Administered 2020-05-13 – 2020-05-18 (×8): 34 [IU] via SUBCUTANEOUS
  Filled 2020-05-13 (×12): qty 0.34

## 2020-05-13 MED ORDER — MIDAZOLAM 50MG/50ML (1MG/ML) PREMIX INFUSION
0.5000 mg/h | INTRAVENOUS | Status: DC
  Administered 2020-05-13 – 2020-05-17 (×3): 1 mg/h via INTRAVENOUS
  Filled 2020-05-13 (×4): qty 50

## 2020-05-13 MED ORDER — ORAL CARE MOUTH RINSE
15.0000 mL | OROMUCOSAL | Status: DC
  Administered 2020-05-13 – 2020-05-31 (×179): 15 mL via OROMUCOSAL

## 2020-05-13 MED ORDER — CHLORHEXIDINE GLUCONATE CLOTH 2 % EX PADS: 6.0000 | MEDICATED_PAD | Freq: Every day | CUTANEOUS | Status: DC

## 2020-05-13 MED ORDER — INSULIN ASPART 100 UNIT/ML ~~LOC~~ SOLN
2.0000 [IU] | SUBCUTANEOUS | Status: DC
  Administered 2020-05-13 (×2): 4 [IU] via SUBCUTANEOUS
  Administered 2020-05-14: 6 [IU] via SUBCUTANEOUS
  Filled 2020-05-13 (×3): qty 1

## 2020-05-13 MED ORDER — FENTANYL 2500MCG IN NS 250ML (10MCG/ML) PREMIX INFUSION
INTRAVENOUS | Status: AC
  Administered 2020-05-13: 50 ug/h via INTRAVENOUS
  Filled 2020-05-13: qty 250

## 2020-05-13 MED ORDER — INSULIN ASPART 100 UNIT/ML IV SOLN: 10.0000 [IU] | Freq: Once | INTRAVENOUS | Status: AC

## 2020-05-13 MED ORDER — CEFAZOLIN SODIUM-DEXTROSE 2-4 GM/100ML-% IV SOLN
2.0000 g | Freq: Two times a day (BID) | INTRAVENOUS | Status: DC
  Administered 2020-05-13 – 2020-05-18 (×10): 2 g via INTRAVENOUS
  Filled 2020-05-13 (×12): qty 100

## 2020-05-13 MED ORDER — SODIUM BICARBONATE 8.4 % IV SOLN
INTRAVENOUS | Status: DC
  Filled 2020-05-13 (×5): qty 850

## 2020-05-13 MED ORDER — MIDAZOLAM HCL 2 MG/2ML IJ SOLN
INTRAMUSCULAR | Status: AC
  Filled 2020-05-13: qty 2

## 2020-05-13 MED ORDER — INSULIN ASPART 100 UNIT/ML ~~LOC~~ SOLN
SUBCUTANEOUS | Status: AC
  Administered 2020-05-13: 10 [IU] via INTRAVENOUS
  Filled 2020-05-13: qty 1

## 2020-05-13 MED ORDER — STERILE WATER FOR INJECTION IV SOLN
INTRAVENOUS | Status: DC
  Filled 2020-05-13 (×3): qty 850

## 2020-05-13 MED ORDER — PROPOFOL 1000 MG/100ML IV EMUL
INTRAVENOUS | Status: AC
  Administered 2020-05-13: 10 mg via INTRAVENOUS
  Filled 2020-05-13: qty 100

## 2020-05-13 MED ORDER — MIDAZOLAM HCL 2 MG/2ML IJ SOLN: 2.0000 mg | Freq: Once | INTRAMUSCULAR | Status: AC

## 2020-05-13 MED ORDER — ACETAMINOPHEN 650 MG RE SUPP
650.0000 mg | RECTAL | Status: DC | PRN
  Administered 2020-05-15 (×2): 650 mg via RECTAL
  Filled 2020-05-13 (×3): qty 1

## 2020-05-13 MED ORDER — FENTANYL 2500MCG IN NS 250ML (10MCG/ML) PREMIX INFUSION
0.0000 ug/h | INTRAVENOUS | Status: DC
  Administered 2020-05-13: 175 ug/h via INTRAVENOUS
  Administered 2020-05-14: 12:00:00 150 ug/h via INTRAVENOUS
  Administered 2020-05-15: 08:00:00 100 ug/h via INTRAVENOUS
  Administered 2020-05-16: 75 ug/h via INTRAVENOUS
  Filled 2020-05-13 (×4): qty 250

## 2020-05-13 MED ORDER — ETOMIDATE 2 MG/ML IV SOLN: 20.0000 mg | Freq: Once | INTRAVENOUS | Status: AC

## 2020-05-13 MED ORDER — PIPERACILLIN-TAZOBACTAM 3.375 G IVPB
3.3750 g | Freq: Three times a day (TID) | INTRAVENOUS | Status: DC
  Administered 2020-05-13: 3.375 g via INTRAVENOUS
  Filled 2020-05-13: qty 50

## 2020-05-13 MED ORDER — PANTOPRAZOLE SODIUM 40 MG IV SOLR
40.0000 mg | Freq: Two times a day (BID) | INTRAVENOUS | Status: DC
  Administered 2020-05-13 – 2020-05-18 (×13): 40 mg via INTRAVENOUS
  Filled 2020-05-13 (×13): qty 40

## 2020-05-13 MED FILL — Medication: Qty: 1 | Status: AC

## 2020-05-13 NOTE — Consult Note (Signed)
Pharmacy Antibiotic Note  Billy Barton is an 75 y.o. male who presented to [facility] on 05/06/2020 with a chief complaint of found down. Patient found to have MSSA bacteremia in 1/2 sets. Pharmacy has been consulted for Cefazolin dosing.  Plan: --Initiate cefazolin 2 gram Q12H based on renal function --continue to monitor renal function and adjust dose as indicated  Height: 5\' 8"  (172.7 cm) Weight: 127 kg (280 lb) IBW/kg (Calculated) : 68.4  Temp (24hrs), Avg:97.4 F (36.3 C), Min:94.7 F (34.8 C), Max:101.3 F (38.5 C)  Recent Labs  Lab 05/17/2020 2222 05/13/20 0026 05/13/20 0218 05/13/20 0548 05/13/20 1000 05/13/20 1445 05/13/20 1830  WBC 26.2* 21.4* 29.4* 27.2* 31.2* 30.4* 25.6*  CREATININE 4.34* 4.15* 3.83* 3.50* 3.86* 4.04* 4.26*  LATICACIDVEN 5.8* 2.8* 3.4* 2.0*  --   --   --     Estimated Creatinine Clearance: 19.8 mL/min (A) (by C-G formula based on SCr of 4.26 mg/dL (H)).    Not on File  Antimicrobials this admission: Cefepime >> 1/12 x1 Metronidazole >> 1/13 x1 Zosyn>> 1/13 x 1 Vancomycin >> 1500 mg x1  Dose adjustments this admission: n/a  Microbiology results: 1/12 BCx: MSSA 1/2 sets  Thank you for allowing pharmacy to be a part of this patient's care.  3/12, PharmD, BCPS Clinical Pharmacist  05/13/2020 7:41 PM

## 2020-05-13 NOTE — Progress Notes (Signed)
Chaplain responded to Code Blue. Upon arrival medical staff attending to patient. Consulted AC if family was present. Patient had just been admitted to ICU prior this code event. No family to make contact with, nursing staff will call emergency contact. Maintained presence throughout the call for patient and staff.

## 2020-05-13 NOTE — Progress Notes (Signed)
PHARMACY - PHYSICIAN COMMUNICATION CRITICAL VALUE ALERT - BLOOD CULTURE IDENTIFICATION (BCID)  Billy Barton is an 75 y.o. male who presented to [facility] on 05/18/2020 with a chief complaint of found down  Assessment:  Blood culture 1/12 with 1/2 sets (both bottles of one set) with GPC, BCID = MSSA  Name of physician (or Provider) Contacted: Dr Rivka Safer  Current antibiotics: zosyn, vancomycin 1500mg  x 1 at 0130 1/13  Changes to prescribed antibiotics recommended:  Recommendations accepted by provider - ID to see  Results for orders placed or performed during the hospital encounter of 05/08/2020  Blood Culture ID Panel (Reflexed) (Collected: 05/08/2020 10:24 PM)  Result Value Ref Range   Enterococcus faecalis NOT DETECTED NOT DETECTED   Enterococcus Faecium NOT DETECTED NOT DETECTED   Listeria monocytogenes NOT DETECTED NOT DETECTED   Staphylococcus species DETECTED (A) NOT DETECTED   Staphylococcus aureus (BCID) DETECTED (A) NOT DETECTED   Staphylococcus epidermidis NOT DETECTED NOT DETECTED   Staphylococcus lugdunensis NOT DETECTED NOT DETECTED   Streptococcus species NOT DETECTED NOT DETECTED   Streptococcus agalactiae NOT DETECTED NOT DETECTED   Streptococcus pneumoniae NOT DETECTED NOT DETECTED   Streptococcus pyogenes NOT DETECTED NOT DETECTED   A.calcoaceticus-baumannii NOT DETECTED NOT DETECTED   Bacteroides fragilis NOT DETECTED NOT DETECTED   Enterobacterales NOT DETECTED NOT DETECTED   Enterobacter cloacae complex NOT DETECTED NOT DETECTED   Escherichia coli NOT DETECTED NOT DETECTED   Klebsiella aerogenes NOT DETECTED NOT DETECTED   Klebsiella oxytoca NOT DETECTED NOT DETECTED   Klebsiella pneumoniae NOT DETECTED NOT DETECTED   Proteus species NOT DETECTED NOT DETECTED   Salmonella species NOT DETECTED NOT DETECTED   Serratia marcescens NOT DETECTED NOT DETECTED   Haemophilus influenzae NOT DETECTED NOT DETECTED   Neisseria meningitidis NOT DETECTED NOT DETECTED    Pseudomonas aeruginosa NOT DETECTED NOT DETECTED   Stenotrophomonas maltophilia NOT DETECTED NOT DETECTED   Candida albicans NOT DETECTED NOT DETECTED   Candida auris NOT DETECTED NOT DETECTED   Candida glabrata NOT DETECTED NOT DETECTED   Candida krusei NOT DETECTED NOT DETECTED   Candida parapsilosis NOT DETECTED NOT DETECTED   Candida tropicalis NOT DETECTED NOT DETECTED   Cryptococcus neoformans/gattii NOT DETECTED NOT DETECTED   Meth resistant mecA/C and MREJ NOT DETECTED NOT DETECTED    07/10/2020, PharmD, BCPS.   Work Cell: [phone] 05/13/2020 2:45 PM

## 2020-05-13 NOTE — Progress Notes (Signed)
PHARMACY CONSULT NOTE - FOLLOW UP  Pharmacy Consult for Electrolyte Monitoring and Replacement   Recent Labs: Potassium (mmol/L)  Date Value  05/13/2020 3.1 (L)   Calcium (mg/dL)  Date Value  97/67/3419 9.2   Albumin (g/dL)  Date Value  37/90/2409 2.5 (L)   Sodium (mmol/L)  Date Value  05/13/2020 142     Assessment: 75 year old male admitted with metabolic acidosis s/t DKA vs HHS. Code Blue called after arrival to ICU and patient intubated. Blood cultures positive for MSSA. Patient remains on insulin and bicarb drips. Pharmacy to manage electrolytes.  Goal of Therapy:  Electrolytes WNL, K ~ 4  Plan:  Patient with worsening creatinine. Potassium low s/t insulin and bicarb drips. Will give potassium 40 mEq per tube. BMP every 4 hours while on insulin drip. Continue to follow.  Pricilla Riffle ,PharmD Clinical Pharmacist 05/13/2020 2:49 PM

## 2020-05-13 NOTE — Sepsis Progress Note (Signed)
Will complete sepsis monitoring at this time. Patient now admitted to ICU as ICU patient and suffered a cardiac arrest with ROSC. Sepsis bundle complete with clearance of lactic acid, adequate fluid resuscitation and antibiotics administered.

## 2020-05-13 NOTE — Progress Notes (Signed)
Bun/ Cr levels high Dr. Belia Heman notified.

## 2020-05-13 NOTE — Progress Notes (Signed)
Central Venous Catheter Insertion Procedure Note  Billy Barton  [id number]  [birth year]  Date:05/13/20  Time:4:32 AM   Provider Performing:Melodye Swor Fayrene Fearing   Procedure: Insertion of Non-tunneled Central Venous Catheter(36556) with US guidance (28638)   Indication(s) Medication administration  Consent Unable to obtain consent due to emergent nature of procedure.  Anesthesia Topical only with 1% lidocaine   Timeout Verified patient identification, verified procedure, site/side was marked, verified correct patient position, special equipment/implants available, medications/allergies/relevant history reviewed, required imaging and test results available.  Sterile Technique Maximal sterile technique including full sterile barrier drape, hand hygiene, sterile gown, sterile gloves, mask, hair covering, sterile ultrasound probe cover (if used).  Procedure Description Area of catheter insertion was cleaned with chlorhexidine and draped in sterile fashion.  With real-time ultrasound guidance a central venous catheter was placed into the right internal jugular vein. Nonpulsatile blood flow and easy flushing noted in all ports.  The catheter was sutured in place and sterile dressing applied.  Complications/Tolerance None; patient tolerated the procedure well. Chest X-ray is ordered to verify placement for internal jugular or subclavian cannulation.   Chest x-ray is not ordered for femoral cannulation.  EBL Minimal  Specimen(s) None

## 2020-05-13 NOTE — Progress Notes (Signed)
Called to bedside to evaluate patients breathing and mental status. Patient just arrived from ED about 10 minutes prior. Upon entering room, the patient was obtunded with agonal breathing that almost appeared as if his airway was obstructed. He would not respond to me and his eyes were rolling upward. Patient oxygen saturations began to rapidly drop and he brady down to the 30s. A pulse check was performed and was absent and chest compressions initiated. Immediately began providing oxygen via ambu bag and oxygen saturations did improve rapidly to the 90s. After 1 round of CPR ROSC was achieved. 10units of Insulin IV and 1g Calcium gluconate were given during code due to concern for worsening hyperkalemia due to previous potassium of 6.7. No other meds were given. Code blue was called therefore the ED physician did respond and intubated patient emergently. The patient was given 20mg  Atimodate and 10mg  of Vec. Patient did have a moderate red blackish jelly like stool. Requiring low dose Leovphed at . CVL and Aline placed.   Following ROSC, repeat labs obtained. Hgb 7.1-->6.5. K 6.7-->4.5-->3.7. ABG: 7.18/36/276/13.4. Blood glucose in the 900s. Cr 4.34-->3.83. Lactate 2.5-->3.4. Troponin 50-->47-->52. Repeat EKG obtained. Patient with pacemaker and showing paced beats at 60, no ST elevation. INR 1.5. PT 17.1.   --Receiving 2units of PRBC --CTH stat to evaluate for possible hemorrhagic stroke as source of acute mental status change --Added Bicarb gtt --Continue Endotool --Trend lactates --Serial BMPs/CBCs Q4hr   I did call and update the patients son, . He was unable to provide a lot of past medical history as his father just recently moved here from [facility] 2 years ago. 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 He reported that patient does have a history of heavy drinking. The patient also reportedly ran out of his metformin about a week ago.     Lorin Picket  ACNP-BC

## 2020-05-13 NOTE — Progress Notes (Signed)
PHARMACY CONSULT NOTE - FOLLOW UP  Pharmacy Consult for Electrolyte Monitoring and Replacement   Recent Labs: Potassium (mmol/L)  Date Value  05/13/2020 3.3 (L)   Calcium (mg/dL)  Date Value  33/43/5686 8.6 (L)   Albumin (g/dL)  Date Value  16/83/7290 2.5 (L)   Sodium (mmol/L)  Date Value  05/13/2020 147 (H)     Assessment: 75 year old male admitted with metabolic acidosis s/t DKA vs HHS. Code Blue called after arrival to ICU and patient intubated. Blood cultures positive for MSSA. Patient remains on insulin and bicarb drips. Pharmacy to manage electrolytes.  Goal of Therapy:  Electrolytes WNL, K ~ 4  Plan:  Patient with worsening creatinine. Potassium low s/t insulin and bicarb drips. Gave potassium 40 mEq per tube. Additional 40 mEq ordered by provider. Na increasing due to bicarb infusion. BMP every 4 hours while on insulin drip. Continue to follow duration/rate of bicarb infusion   Sharen Hones ,PharmD Clinical Pharmacist 05/13/2020 7:50 PM

## 2020-05-13 NOTE — Consult Note (Signed)
NAME: Billy Barton  DOB: 1946/02/10  MRN: [id number]  Date/Time: 05/13/2020 2:14 PM  REQUESTING PROVIDER: Dr. Mortimer Fries Subjective:  REASON FOR CONSULT: Staph aureu bacteremia ?Patient is intubated and his needs no history available. Billy Barton is a 75 y.o. male with no records available in [facility] system was brought in by EMS with altered mental status. EMS was called by his son who found him on the floor lying in a puddle of urine. Son had told EMS that this is happened before when the sugar was high.  Initially patient had rest opened his eyes but later became unresponsive. In the ED vitals 94.7, BP 139/58, heart rate 72 respiratory 25 and pulse ox 98%. Labs on presentation with sodium of 126, potassium 6.7, glucose of 1000 346, CO2 of 13, BUN of 162, creatinine of 4.34, albumin of 2.5, CK1 49, lactate 5.8.  WBC was 26.2, hemoglobin 7.1 and platelet 227.  Blood cultures were sent.  He was started on IV fluids and insulin.  He was started also on antibiotics.  He was transferred to ICU and upon arrival in ICU within 10 minutes he had agonal breathing with sats dropping to the 30s.  Pulse was absent.  And chest compressions were started.  He recovered after 1 round of CPR and was intubated. Blood cultures positive for MSSA and I am seeing the patient for same.  Past surgical history X-ray shows a pacemaker  Social History   Socioeconomic History  . Marital status: Widowed    Spouse name: Not on file  . Number of children: Not on file  . Years of education: Not on file  . Highest education level: Not on file  Occupational History  . Not on file  Tobacco Use  . Smoking status: Not on file  . Smokeless tobacco: Not on file  Substance and Sexual Activity  . Alcohol use: Not on file  . Drug use: Not on file  . Sexual activity: Not on file  Other Topics Concern  . Not on file  Social History Narrative  . Not on file   Social Determinants of Health   Financial Resource Strain: Not on file   Food Insecurity: Not on file  Transportation Needs: Not on file  Physical Activity: Not on file  Stress: Not on file  Social Connections: Not on file  Intimate Partner Violence: Not on file    History reviewed. No pertinent family history. Not on File  ? Current Facility-Administered Medications  Medication Dose Route Frequency Provider Last Rate Last Admin  . 0.9 %  sodium chloride infusion  10 mL/hr Intravenous Once Harvest Dark, MD   Held at 05/13/20 0308  . acetaminophen (TYLENOL) suppository 650 mg  650 mg Rectal Q4H PRN Flora Lipps, MD      . chlorhexidine gluconate (MEDLINE KIT) (PERIDEX) 0.12 % solution 15 mL  15 mL Mouth Rinse BID Flora Lipps, MD   15 mL at 05/13/20 0814  . dextrose 5 % in lactated ringers infusion   Intravenous Continuous Harvest Dark, MD   Held at 05/13/20 0308  . dextrose 50 % solution 0-50 mL  0-50 mL Intravenous PRN Harvest Dark, MD      . docusate sodium (COLACE) capsule 100 mg  100 mg Oral BID PRN Tonye Royalty, NP      . fentaNYL 2539mg in NS 2563m(1086mml) infusion-PREMIX  0-400 mcg/hr Intravenous Continuous JamTonye RoyaltyP 17.5 mL/hr at 05/13/20 1305 175 mcg/hr at 05/13/20 1305  . insulin regular, human (  MYXREDLIN) 100 units/ 100 mL infusion   Intravenous Continuous Harvest Dark, MD 7 mL/hr at 05/13/20 1305 Infusion Verify at 05/13/20 1305  . MEDLINE mouth rinse  15 mL Mouth Rinse 10 times per day Flora Lipps, MD   15 mL at 05/13/20 1242  . midazolam (VERSED) 50 mg/50 mL (1 mg/mL) premix infusion  0.5-10 mg/hr Intravenous Continuous Flora Lipps, MD 1 mL/hr at 05/13/20 1305 1 mg/hr at 05/13/20 1305  . midazolam (VERSED) injection 2 mg  2 mg Intravenous Once Tonye Royalty, NP      . norepinephrine (LEVOPHED) 16 mg in 210m premix infusion  0-40 mcg/min Intravenous Titrated JTonye Royalty NP 37.5 mL/hr at 05/13/20 1305 40 mcg/min at 05/13/20 1305  . pantoprazole (PROTONIX) injection 40 mg  40 mg Intravenous QVioleta Gelinas NP   40 mg at 05/13/20 0951  . piperacillin-tazobactam (ZOSYN) IVPB 3.375 g  3.375 g Intravenous Q8H Kasa, Kurian, MD      . polyethylene glycol (MIRALAX / GLYCOLAX) packet 17 g  17 g Oral Daily PRN JTonye Royalty NP      . sodium bicarbonate 150 mEq in dextrose 5 % 1,000 mL infusion   Intravenous Continuous KFlora Lipps MD 125 mL/hr at 05/13/20 1305 Infusion Verify at 05/13/20 1305  . vancomycin (VANCOCIN) IVPB 1000 mg/200 mL premix  1,000 mg Intravenous Once PHarvest Dark MD      . vasopressin (PITRESSIN) 20 Units in sodium chloride 0.9 % 100 mL infusion-*FOR SHOCK*  0-0.03 Units/min Intravenous Continuous KFlora Lipps MD 9 mL/hr at 05/13/20 1305 0.03 Units/min at 05/13/20 1305  . vecuronium (NORCURON) injection 10 mg  10 mg Intravenous Q1H PRN KFlora Lipps MD         Abtx:  Anti-infectives (From admission, onward)   Start     Dose/Rate Route Frequency Ordered Stop   05/13/20 1400  piperacillin-tazobactam (ZOSYN) IVPB 3.375 g        3.375 g 12.5 mL/hr over 240 Minutes Intravenous Every 8 hours 05/13/20 1008     05/11/2020 2315  ceFEPIme (MAXIPIME) 2 g in sodium chloride 0.9 % 100 mL IVPB        2 g 200 mL/hr over 30 Minutes Intravenous  Once 05/21/2020 2309 05/13/20 0034   05/09/2020 2315  metroNIDAZOLE (FLAGYL) IVPB 500 mg        500 mg 100 mL/hr over 60 Minutes Intravenous  Once 05/06/2020 2309 05/13/20 0309   05/25/2020 2315  vancomycin (VANCOCIN) IVPB 1000 mg/200 mL premix  Status:  Discontinued        1,000 mg 200 mL/hr over 60 Minutes Intravenous  Once 05/07/2020 2309 05/24/2020 2313   05/29/2020 2315  vancomycin (VANCOREADY) IVPB 1500 mg/300 mL       "Followed by" Linked Group Details   1,500 mg 150 mL/hr over 120 Minutes Intravenous  Once 05/14/2020 2314 05/13/20 0323   05/29/2020 2315  vancomycin (VANCOCIN) IVPB 1000 mg/200 mL premix       "Followed by" Linked Group Details   1,000 mg 200 mL/hr over 60 Minutes Intravenous  Once 05/01/2020 2314        REVIEW OF  SYSTEMS:  NA ?  Objective:  VITALS:  BP (!) 124/52 (BP Location: Left Arm)   Pulse (!) 104   Temp (!) 101.3 F (38.5 C) (Bladder)   Resp 17   Ht _0  (1.727 m)   Wt 127 kg   SpO2 94%   BMI 42.57 kg/m  PHYSICAL EXAM:  General: Intubated, sedated on  2 pressors Head: Normocephalic, without obvious abnormality, atraumatic. Eyes: Conjunctivae clear, anicteric sclerae. Pupils are equal ENT cannot be examined Neck:, symmetrical, no adenopathy, thyroid: non tender no carotid bruit and no JVD. Back: No CVA tenderness. Lungs: Bilateral air entry. Crepitations in the bases Heart: S1-S2. Chest wall pacemaker can be felt Abdomen: Soft, non-tender,not distended. Bowel sounds normal. No masses Extremities: Edematous skin: No rashes or lesions. Or bruising Lymph: Cervical, supraclavicular normal. Neurologic: Cannot be assessed Foley in place IJ catheter  Pertinent Labs Lab Results CBC    Component Value Date/Time   WBC 31.2 (H) 05/13/2020 1000   RBC 2.48 (L) 05/13/2020 1000   HGB 7.6 (L) 05/13/2020 1000   HCT 22.4 (L) 05/13/2020 1000   PLT 221 05/13/2020 1000   MCV 90.3 05/13/2020 1000   MCH 30.6 05/13/2020 1000   MCHC 33.9 05/13/2020 1000   RDW 15.9 (H) 05/13/2020 1000   LYMPHSABS 2.2 05/09/2020 2222   MONOABS 1.6 (H) 05/03/2020 2222   EOSABS 0.0 05/10/2020 2222   BASOSABS 0.0 05/16/2020 2222    CMP Latest Ref Rng & Units 05/13/2020 05/13/2020 05/13/2020  Glucose 70 - 99 mg/dL 354(H) 684(HH) 963(HH)  BUN 8 - 23 mg/dL 148(H) 147(H) 147(H)  Creatinine 0.61 - 1.24 mg/dL 3.86(H) 3.50(H) 3.83(H)  Sodium 135 - 145 mmol/L 142 138 135  Potassium 3.5 - 5.1 mmol/L 3.1(L) 3.5 3.7  Chloride 98 - 111 mmol/L 112(H) 111 108  CO2 22 - 32 mmol/L 18(L) 16(L) 14(L)  Calcium 8.9 - 10.3 mg/dL 9.2 9.3 10.3  Total Protein 6.5 - 8.1 g/dL - - -  Total Bilirubin 0.3 - 1.2 mg/dL - - -  Alkaline Phos 38 - 126 U/L - - -  AST 15 - 41 U/L - - -  ALT 0 - 44 U/L - - -       Microbiology: Recent Results (from the past 240 hour(s))  Blood culture (routine single)     Status: None (Preliminary result)   Collection Time: 05/18/2020 10:24 PM   Specimen: BLOOD  Result Value Ref Range Status   Specimen Description BLOOD LEFT ASSIST CONTROL  Final   Special Requests   Final    BOTTLES DRAWN AEROBIC AND ANAEROBIC Blood Culture adequate volume   Culture  Setup Time   Final    Organism ID to follow GRAM POSITIVE COCCI IN BOTH AEROBIC AND ANAEROBIC BOTTLES CRITICAL RESULT CALLED TO, READ BACK BY AND VERIFIED WITH: MYRA SLAUGHTER AT 1309 ON 05/13/20 SNG Performed at [facility], [address] [place], [place] [address]    Culture GRAM POSITIVE COCCI  Final   Report Status PENDING  Incomplete  Culture, blood (single)     Status: None (Preliminary result)   Collection Time: 05/13/2020 10:24 PM   Specimen: BLOOD  Result Value Ref Range Status   Specimen Description BLOOD LEFT HAND  Final   Special Requests   Final    BOTTLES DRAWN AEROBIC AND ANAEROBIC Blood Culture adequate volume   Culture   Final    NO GROWTH < 12 HOURS Performed at [facility], [address] [place], [place] [address]    Report Status PENDING  Incomplete  Blood Culture ID Panel (Reflexed)     Status: Abnormal   Collection Time: 05/16/2020 10:24 PM  Result Value Ref Range Status   Enterococcus faecalis NOT DETECTED NOT DETECTED Final   Enterococcus Faecium NOT DETECTED NOT DETECTED Final   Listeria monocytogenes NOT DETECTED NOT DETECTED Final  Staphylococcus species DETECTED (A) NOT DETECTED Final    Comment: CRITICAL RESULT CALLED TO, READ BACK BY AND VERIFIED WITH: MYRA SLAUGHTER AT 1309 ON 05/13/20 SNG    Staphylococcus aureus (BCID) DETECTED (A) NOT DETECTED Final    Comment: CRITICAL RESULT CALLED TO, READ BACK BY AND VERIFIED WITH: MYRA SLAUGHTER AT 1309 05/13/20 SNG    Staphylococcus epidermidis NOT DETECTED NOT DETECTED Final   Staphylococcus  lugdunensis NOT DETECTED NOT DETECTED Final   Streptococcus species NOT DETECTED NOT DETECTED Final   Streptococcus agalactiae NOT DETECTED NOT DETECTED Final   Streptococcus pneumoniae NOT DETECTED NOT DETECTED Final   Streptococcus pyogenes NOT DETECTED NOT DETECTED Final   A.calcoaceticus-baumannii NOT DETECTED NOT DETECTED Final   Bacteroides fragilis NOT DETECTED NOT DETECTED Final   Enterobacterales NOT DETECTED NOT DETECTED Final   Enterobacter cloacae complex NOT DETECTED NOT DETECTED Final   Escherichia coli NOT DETECTED NOT DETECTED Final   Klebsiella aerogenes NOT DETECTED NOT DETECTED Final   Klebsiella oxytoca NOT DETECTED NOT DETECTED Final   Klebsiella pneumoniae NOT DETECTED NOT DETECTED Final   Proteus species NOT DETECTED NOT DETECTED Final   Salmonella species NOT DETECTED NOT DETECTED Final   Serratia marcescens NOT DETECTED NOT DETECTED Final   Haemophilus influenzae NOT DETECTED NOT DETECTED Final   Neisseria meningitidis NOT DETECTED NOT DETECTED Final   Pseudomonas aeruginosa NOT DETECTED NOT DETECTED Final   Stenotrophomonas maltophilia NOT DETECTED NOT DETECTED Final   Candida albicans NOT DETECTED NOT DETECTED Final   Candida auris NOT DETECTED NOT DETECTED Final   Candida glabrata NOT DETECTED NOT DETECTED Final   Candida krusei NOT DETECTED NOT DETECTED Final   Candida parapsilosis NOT DETECTED NOT DETECTED Final   Candida tropicalis NOT DETECTED NOT DETECTED Final   Cryptococcus neoformans/gattii NOT DETECTED NOT DETECTED Final   Meth resistant mecA/C and MREJ NOT DETECTED NOT DETECTED Final    Comment: Performed at [facility], [address] [place], [place] [address]  Resp Panel by RT-PCR (Flu A&B, Covid) Nasopharyngeal Swab     Status: None   Collection Time: 05/13/20 12:26 AM   Specimen: Nasopharyngeal Swab; Nasopharyngeal(NP) swabs in vial transport medium  Result Value Ref Range Status   SARS Coronavirus 2 by RT PCR NEGATIVE NEGATIVE  Final    Comment: (NOTE) SARS-CoV-2 target nucleic acids are NOT DETECTED.  The SARS-CoV-2 RNA is generally detectable in upper respiratory specimens during the acute phase of infection. The lowest concentration of SARS-CoV-2 viral copies this assay can detect is 138 copies/mL. A negative result does not preclude SARS-Cov-2 infection and should not be used as the sole basis for treatment or other patient management decisions. A negative result may occur with  improper specimen collection/handling, submission of specimen other than nasopharyngeal swab, presence of viral mutation(s) within the areas targeted by this assay, and inadequate number of viral copies(<138 copies/mL). A negative result must be combined with clinical observations, patient history, and epidemiological information. The expected result is Negative.  Fact Sheet for Patients:  EntrepreneurPulse.com.au  Fact Sheet for Healthcare Providers:  IncredibleEmployment.be  This test is no t yet approved or cleared by the [place] FDA and  has been authorized for detection and/or diagnosis of SARS-CoV-2 by FDA under an Emergency Use Authorization (EUA). This EUA will remain  in effect (meaning this test can be used) for the duration of the COVID-19 declaration under Section 564(b)(1) of the Act, 21 U.S.C.section 360bbb-3(b)(1), unless the authorization is terminated  or revoked sooner.  Influenza A by PCR NEGATIVE NEGATIVE Final   Influenza B by PCR NEGATIVE NEGATIVE Final    Comment: (NOTE) The Xpert Xpress SARS-CoV-2/FLU/RSV plus assay is intended as an aid in the diagnosis of influenza from Nasopharyngeal swab specimens and should not be used as a sole basis for treatment. Nasal washings and aspirates are unacceptable for Xpert Xpress SARS-CoV-2/FLU/RSV testing.  Fact Sheet for Patients: EntrepreneurPulse.com.au  Fact Sheet for Healthcare  Providers: IncredibleEmployment.be  This test is not yet approved or cleared by the [place] FDA and has been authorized for detection and/or diagnosis of SARS-CoV-2 by FDA under an Emergency Use Authorization (EUA). This EUA will remain in effect (meaning this test can be used) for the duration of the COVID-19 declaration under Section 564(b)(1) of the Act, 21 U.S.C. section 360bbb-3(b)(1), unless the authorization is terminated or revoked.  Performed at [facility], [address] [place], [place] [address]     IMAGING RESULTS: CT head shows hypodensity in the region of the anterior cerebral arteries along the interhemispheric fissure reflecting some chronic calcified plaque versus remote calcified thrombus. 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 Moderate parenchymal volume loss and chronic microvascular angiopathy.  mild left frontoparietal scalp swelling I have personally reviewed the films ?    Impression/Recommendation ? ?Acute encephalopathy secondary to hyperglycemia, hyponatremia. Uncontrolled diabetes with hyperglycemia, DKA. Hyponatremia Severe hyperkalemia  Cardiac arrest leading to resuscitation and intubation.  Staff aureus bacteremia With no obvious source concern for deep infection especially with pacemaker in place. Will repeat blood cultures.  Currently on Vanco and cefepime switched him to cefazolin  AKI: Unclear what his baseline is. This could be manifestation of prerenal, underlying diabetes disease, ATN secondary to infection. ? ___________________________________________________ Discussed with care team. Note:  This document was prepared using Dragon voice recognition software and may include unintentional dictation errors.

## 2020-05-13 NOTE — Progress Notes (Signed)
Arterial Catheter Insertion Procedure Note  Roc Streett  [id number]  Feb 27, 1946  Date:05/13/20  Time:4:30 AM    Provider Performing: Amanda Cockayne    Procedure: Insertion of Arterial Line (95188) without US guidance  Indication(s) Blood pressure monitoring and/or need for frequent ABGs  Consent Unable to obtain consent due to emergent nature of procedure.  Anesthesia None   Time Out Verified patient identification, verified procedure, site/side was marked, verified correct patient position, special equipment/implants available, medications/allergies/relevant history reviewed, required imaging and test results available.   Sterile Technique Maximal sterile technique including full sterile barrier drape, hand hygiene, sterile gown, sterile gloves, mask, hair covering, sterile ultrasound probe cover (if used).   Procedure Description Area of catheter insertion was cleaned with chlorhexidine and draped in sterile fashion. Without real-time ultrasound guidance an arterial catheter was placed into the left radial artery.  Appropriate arterial tracings confirmed on monitor.     Complications/Tolerance None; patient tolerated the procedure well.   EBL Minimal   Specimen(s) None

## 2020-05-13 NOTE — Progress Notes (Signed)
Shift summary  0200Patient arrived to unit agitated pulling at lines. Patient hard to redirect and get to follow commands.  0210 Patient began agonal breathing and went unresponsive. Code blue called and patient intubated.  0400 patient taken to Ct with no incidents   - Patient requiring Levo to maintain blood pressure -Patient unresponsive on vent  provider is aware  - Patient on insulin GTTS and sugars are trending down

## 2020-05-13 NOTE — Progress Notes (Signed)
Shift report: Pt stable at this time, required frequent interventions to get BP stable. BP maintained on levophed and vasopressin. Was febrile this shift but now normothermic. HR irregular, now in the 80's. CBG's being checked every one hour, insulin drip infusing. Pt remains ventilated and sedated.

## 2020-05-13 NOTE — Progress Notes (Addendum)
Patients towel, watch, and yellow chain/w cross at bedside with patient labels in patient belonging bag

## 2020-05-13 NOTE — Progress Notes (Signed)
Spoke with son Antino Mayabb, updated on patient's condition. Son states that he has MS, is immunocompromised, and uses a walker/ w/c so will not be able to come to the hospital. He requests periodic updates via phone.

## 2020-05-13 NOTE — ED Provider Notes (Signed)
[facility] Department of Emergency Medicine   Code Blue CONSULT NOTE  Chief Complaint: Cardiac arrest/unresponsive   Level V Caveat: Unresponsive  History of present illness: I was contacted by the hospital for a CODE BLUE cardiac arrest upstairs and presented to the patient's bedside.   Billy Cockayne, NP currently running the code. Patient became apneic and lost pulses. CPR started. They have achieved ROSC. Dr. Arsenio Loader with CCM watching through E link.  I was asked to intubate patient to protect airway.  Patient is a full code.  ROS: Unable to obtain, Level V caveat  Scheduled Meds: . etomidate      . insulin aspart      . midazolam      . pantoprazole (PROTONIX) IV  40 mg Intravenous Q12H  . vecuronium       Continuous Infusions: . norepinephrine    . sodium chloride    . dextrose 5% lactated ringers    . insulin 15 Units/hr (05/09/2020 2349)  . lactated ringers 150 mL/hr at 05/19/2020 2346  . lactated ringers    . vancomycin 1,500 mg (05/13/20 0133)   Followed by  . vancomycin     PRN Meds:.dextrose, docusate sodium, polyethylene glycol History reviewed. No pertinent past medical history. History reviewed. No pertinent surgical history. Social History   Socioeconomic History  . Marital status: Widowed    Spouse name: Not on file  . Number of children: Not on file  . Years of education: Not on file  . Highest education level: Not on file  Occupational History  . Not on file  Tobacco Use  . Smoking status: Not on file  . Smokeless tobacco: Not on file  Substance and Sexual Activity  . Alcohol use: Not on file  . Drug use: Not on file  . Sexual activity: Not on file  Other Topics Concern  . Not on file  Social History Narrative  . Not on file   Social Determinants of Health   Financial Resource Strain: Not on file  Food Insecurity: Not on file  Transportation Needs: Not on file  Physical Activity: Not on file  Stress: Not on file   Social Connections: Not on file  Intimate Partner Violence: Not on file   Not on File  Last set of Vital Signs (not current) Vitals:   05/13/20 0115 05/13/20 0130  BP:  110/65  Pulse: 75 82  Resp: (!) 25 (!) 25  Temp: (!) 96.2 F (35.7 C) (!) 96.4 F (35.8 C)  SpO2: 98% 99%      Physical Exam  Gen: unresponsive Cardiovascular: ROSC obtained Resp: apneic. Breath sounds equal bilaterally with bagging  Abd: nondistended  Neuro: GCS 3, unresponsive to pain  HEENT: Small amount of dark red blood in the posterior oropharynx Neck: Trachea midline Musculoskeletal: No deformity  Skin: warm  Procedures (when applicable, including Critical Care time): Procedure Name: Intubation Date/Time: 05/13/2020 2:00 AM Performed by: Jamieon Lannen, Layla Maw, DO Pre-anesthesia Checklist: Patient identified, Patient being monitored, Emergency Drugs available, Timeout performed and Suction available Oxygen Delivery Method: Ambu bag Preoxygenation: Pre-oxygenation with 100% oxygen Induction Type: Rapid sequence Ventilation: Mask ventilation without difficulty Laryngoscope Size: Glidescope and 3 Grade View: Grade II Tube size: 7.5 mm Number of attempts: 1 Placement Confirmation: ETT inserted through vocal cords under direct vision,  CO2 detector and Breath sounds checked- equal and bilateral Secured at: 25 cm Tube secured with: ETT holder        MDM / Assessment  and Plan Patient intubated for respiratory failure and cardiac arrest. ROSC achieved prior to my arrival in the ED. Critical care team to follow-up on chest x-ray and further evaluation, treatment for patient.    Marlenne Ridge, Layla Maw, DO 05/13/20 [phone]

## 2020-05-14 ENCOUNTER — Inpatient Hospital Stay (HOSPITAL_COMMUNITY)
Admit: 2020-05-14 | Discharge: 2020-05-14 | Disposition: A | Discharge status: Home / Self Care | Payer: No Typology Code available for payment source | Attending: Cardiovascular Disease | Admitting: Cardiovascular Disease

## 2020-05-14 ENCOUNTER — Inpatient Hospital Stay: Payer: No Typology Code available for payment source

## 2020-05-14 ENCOUNTER — Encounter: Payer: Self-pay | Admitting: Internal Medicine

## 2020-05-14 DIAGNOSIS — E875 Hyperkalemia: Secondary | ICD-10-CM | POA: Diagnosis not present

## 2020-05-14 DIAGNOSIS — D649 Anemia, unspecified: Secondary | ICD-10-CM

## 2020-05-14 DIAGNOSIS — R7881 Bacteremia: Secondary | ICD-10-CM | POA: Diagnosis not present

## 2020-05-14 DIAGNOSIS — I4892 Unspecified atrial flutter: Secondary | ICD-10-CM

## 2020-05-14 DIAGNOSIS — D6489 Other specified anemias: Secondary | ICD-10-CM

## 2020-05-14 DIAGNOSIS — E1111 Type 2 diabetes mellitus with ketoacidosis with coma: Secondary | ICD-10-CM | POA: Diagnosis not present

## 2020-05-14 LAB — PREPARE RBC (CROSSMATCH)

## 2020-05-14 LAB — CBC WITH DIFFERENTIAL/PLATELET
Abs Immature Granulocytes: 0.12 10*3/uL — ABNORMAL HIGH (ref 0.00–0.07)
Basophils Absolute: 0 10*3/uL (ref 0.0–0.1)
Basophils Relative: 0 %
Eosinophils Absolute: 0.1 10*3/uL (ref 0.0–0.5)
Eosinophils Relative: 1 %
HCT: 26.9 % — ABNORMAL LOW (ref 39.0–52.0)
Hemoglobin: 9.3 g/dL — ABNORMAL LOW (ref 13.0–17.0)
Immature Granulocytes: 1 %
Lymphocytes Relative: 9 %
Lymphs Abs: 1.3 10*3/uL (ref 0.7–4.0)
MCH: 30.7 pg (ref 26.0–34.0)
MCHC: 34.6 g/dL (ref 30.0–36.0)
MCV: 88.8 fL (ref 80.0–100.0)
Monocytes Absolute: 1.3 10*3/uL — ABNORMAL HIGH (ref 0.1–1.0)
Monocytes Relative: 10 %
Neutro Abs: 10.8 10*3/uL — ABNORMAL HIGH (ref 1.7–7.7)
Neutrophils Relative %: 79 %
Platelets: 108 10*3/uL — ABNORMAL LOW (ref 150–400)
RBC: 3.03 MIL/uL — ABNORMAL LOW (ref 4.22–5.81)
RDW: 18.2 % — ABNORMAL HIGH (ref 11.5–15.5)
Smear Review: NORMAL
WBC: 13.6 10*3/uL — ABNORMAL HIGH (ref 4.0–10.5)
nRBC: 0.4 % — ABNORMAL HIGH (ref 0.0–0.2)

## 2020-05-14 LAB — BLOOD GAS, ARTERIAL
Acid-base deficit: 4.6 mmol/L — ABNORMAL HIGH (ref 0.0–2.0)
Bicarbonate: 19.8 mmol/L — ABNORMAL LOW (ref 20.0–28.0)
FIO2: 0.35
MECHVT: 500 mL
O2 Saturation: 96.6 %
PEEP: 5 cmH2O
Patient temperature: 37
RATE: 16 resp/min
pCO2 arterial: 32 mmHg (ref 32.0–48.0)
pH, Arterial: 7.4 (ref 7.350–7.450)
pO2, Arterial: 87 mmHg (ref 83.0–108.0)

## 2020-05-14 LAB — GLUCOSE, CAPILLARY
Glucose-Capillary: 147 mg/dL — ABNORMAL HIGH (ref 70–99)
Glucose-Capillary: 144 mg/dL — ABNORMAL HIGH (ref 70–99)
Glucose-Capillary: 172 mg/dL — ABNORMAL HIGH (ref 70–99)
Glucose-Capillary: 165 mg/dL — ABNORMAL HIGH (ref 70–99)
Glucose-Capillary: 223 mg/dL — ABNORMAL HIGH (ref 70–99)
Glucose-Capillary: 223 mg/dL — ABNORMAL HIGH (ref 70–99)
Glucose-Capillary: 283 mg/dL — ABNORMAL HIGH (ref 70–99)
Glucose-Capillary: 278 mg/dL — ABNORMAL HIGH (ref 70–99)
Glucose-Capillary: 264 mg/dL — ABNORMAL HIGH (ref 70–99)
Glucose-Capillary: 226 mg/dL — ABNORMAL HIGH (ref 70–99)
Glucose-Capillary: 222 mg/dL — ABNORMAL HIGH (ref 70–99)
Glucose-Capillary: 195 mg/dL — ABNORMAL HIGH (ref 70–99)

## 2020-05-14 LAB — HEMOGLOBIN A1C
Hgb A1c MFr Bld: 10.2 % — ABNORMAL HIGH (ref 4.8–5.6)
Mean Plasma Glucose: 246.04 mg/dL

## 2020-05-14 LAB — COMPREHENSIVE METABOLIC PANEL
ALT: 10 U/L (ref 0–44)
AST: 21 U/L (ref 15–41)
Albumin: 2.1 g/dL — ABNORMAL LOW (ref 3.5–5.0)
Alkaline Phosphatase: 46 U/L (ref 38–126)
Anion gap: 14 (ref 5–15)
BUN: 119 mg/dL — ABNORMAL HIGH (ref 8–23)
CO2: 22 mmol/L (ref 22–32)
Calcium: 8 mg/dL — ABNORMAL LOW (ref 8.9–10.3)
Chloride: 111 mmol/L (ref 98–111)
Creatinine, Ser: 4.46 mg/dL — ABNORMAL HIGH (ref 0.61–1.24)
GFR, Estimated: 13 mL/min — ABNORMAL LOW (ref >60)
Glucose, Bld: 289 mg/dL — ABNORMAL HIGH (ref 70–99)
Potassium: 3.9 mmol/L (ref 3.5–5.1)
Sodium: 147 mmol/L — ABNORMAL HIGH (ref 135–145)
Total Bilirubin: 0.9 mg/dL (ref 0.3–1.2)
Total Protein: 4.8 g/dL — ABNORMAL LOW (ref 6.5–8.1)

## 2020-05-14 LAB — ECHOCARDIOGRAM COMPLETE
Height: 68 in
S' Lateral: 3 cm
Weight: 3883.62 oz

## 2020-05-14 LAB — URINE CULTURE: Culture: NO GROWTH

## 2020-05-14 LAB — PHOSPHORUS
Phosphorus: 2.5 mg/dL (ref 2.5–4.6)
Phosphorus: 3 mg/dL (ref 2.5–4.6)

## 2020-05-14 LAB — CBC
HCT: 21.5 % — ABNORMAL LOW (ref 39.0–52.0)
Hemoglobin: 7.5 g/dL — ABNORMAL LOW (ref 13.0–17.0)
MCH: 31.3 pg (ref 26.0–34.0)
MCHC: 34.9 g/dL (ref 30.0–36.0)
MCV: 89.6 fL (ref 80.0–100.0)
Platelets: 134 10*3/uL — ABNORMAL LOW (ref 150–400)
RBC: 2.4 MIL/uL — ABNORMAL LOW (ref 4.22–5.81)
RDW: 18 % — ABNORMAL HIGH (ref 11.5–15.5)
WBC: 18.6 10*3/uL — ABNORMAL HIGH (ref 4.0–10.5)
nRBC: 0.2 % (ref 0.0–0.2)

## 2020-05-14 LAB — MAGNESIUM
Magnesium: 1.8 mg/dL (ref 1.7–2.4)
Magnesium: 1.9 mg/dL (ref 1.7–2.4)

## 2020-05-14 MED ORDER — SODIUM CHLORIDE 0.9% IV SOLUTION: Freq: Once | INTRAVENOUS | Status: AC

## 2020-05-14 MED ORDER — LACTATED RINGERS IV BOLUS
1000.0000 mL | Freq: Once | INTRAVENOUS | Status: AC
  Administered 2020-05-14: 1000 mL via INTRAVENOUS

## 2020-05-14 MED ORDER — INSULIN ASPART 100 UNIT/ML ~~LOC~~ SOLN
0.0000 [IU] | SUBCUTANEOUS | Status: DC
  Administered 2020-05-14: 3 [IU] via SUBCUTANEOUS
  Administered 2020-05-14: 2 [IU] via SUBCUTANEOUS
  Administered 2020-05-14 (×3): 5 [IU] via SUBCUTANEOUS
  Administered 2020-05-15 – 2020-05-16 (×6): 2 [IU] via SUBCUTANEOUS
  Administered 2020-05-17: 3 [IU] via SUBCUTANEOUS
  Administered 2020-05-17: 5 [IU] via SUBCUTANEOUS
  Administered 2020-05-17: 3 [IU] via SUBCUTANEOUS
  Administered 2020-05-17 (×2): 2 [IU] via SUBCUTANEOUS
  Filled 2020-05-14 (×16): qty 1

## 2020-05-14 MED ORDER — CHLORHEXIDINE GLUCONATE CLOTH 2 % EX PADS
6.0000 | MEDICATED_PAD | Freq: Every day | CUTANEOUS | Status: DC
  Administered 2020-05-14 – 2020-05-31 (×16): 6 via TOPICAL

## 2020-05-14 MED ORDER — METRONIDAZOLE IN NACL 5-0.79 MG/ML-% IV SOLN: 500.0000 mg | Freq: Three times a day (TID) | INTRAVENOUS | Status: DC

## 2020-05-14 MED ORDER — METRONIDAZOLE IN NACL 5-0.79 MG/ML-% IV SOLN
500.0000 mg | Freq: Three times a day (TID) | INTRAVENOUS | Status: DC
  Administered 2020-05-14 – 2020-05-18 (×11): 500 mg via INTRAVENOUS
  Filled 2020-05-14 (×13): qty 100

## 2020-05-14 NOTE — Progress Notes (Signed)
Inpatient Diabetes Program Recommendations  AACE/ADA: New Consensus Statement on Inpatient Glycemic Control   Target Ranges:  Prepandial:   less than 140 mg/dL      Peak postprandial:   less than 180 mg/dL (1-2 hours)      Critically ill patients:  140 - 180 mg/dL   Results for JAVEN, HINDERLITER (MRN [id number]) as of 05/14/2020 07:34  Ref. Range 05/13/2020 08:15 05/13/2020 08:45 05/13/2020 09:54 05/13/2020 11:03 05/13/2020 12:32 05/13/2020 13:24 05/13/2020 14:26 05/13/2020 15:39 05/13/2020 19:11 05/13/2020 20:41 05/13/2020 21:29 05/13/2020 21:50 05/13/2020 23:34 05/14/2020 3:40 05/14/2020 07:22  Glucose-Capillary Latest Ref Range: 70 - 99 mg/dL 621 (H) 308 (H) 657 (H) 226 (H) 189 (H) 172 (H) 158 (H) 186 (H) 174 (H) 176 (H)   Levemir 34 units 178 (H)  Novolog 4 units   Novolog 4 units    Novolog 6 units 283 (H)  Results for HELTON, OLESON (MRN [id number]) as of 05/14/2020 07:34  Ref. Range 05/09/2020 22:22  Glucose Latest Ref Range: 70 - 99 mg/dL 8,413 [facility])  Results for HARLOW, CARRIZALES (MRN [id number]) as of 05/14/2020 07:34  Ref. Range 05/13/2020 05:48  Beta-Hydroxybutyric Acid Latest Ref Range: 0.05 - 0.27 mmol/L 0.24   Review of Glycemic Control  Diabetes history: Unknown Outpatient Diabetes medications: Unknown Current orders for Inpatient glycemic control: Levemir 34 units Q12H, Novolog 0-9 units Q4H  Inpatient Diabetes Program Recommendations:    HbgA1C: A1C in process.  NOTE: Per H&P, presented to ED via EMS after the son found him unresponsive, face down in the kitchen, incontinent of urine. Initial glucose 1346 mg/dl. Past medical history unknown. Patient was started on IV insulin and was transitioned to SQ insulin last night and given Levemir 34 units at 21:29 on 05/13/20. Patient to receive second dose of Levemir this morning. Patient currently intubated and on pressors.  Will follow along while inpatient.  Thanks, Orlando Penner, RN, MSN, CDE Diabetes Coordinator Inpatient Diabetes  Program [phone] (Team Pager from 8am to 5pm)

## 2020-05-14 NOTE — Progress Notes (Signed)
Updated pt's son Welden Hausmann via telephone regarding plan of care.  All questions answered.  He is very appreciate of the update.   Harlon Ditty, AGACNP-BC [place] Pager: [phone]

## 2020-05-14 NOTE — Consult Note (Signed)
Cardiology Consult    Patient ID: Billy Barton MRN: [id number], DOB/AGE: [birth year]   Admit date: 05/09/2020 Date of Consult: 05/14/2020  Primary Physician: Patient, No Pcp Per Primary Cardiologist: [facility] Requesting Provider: Corbin Ade, MD  Patient Profile    Billy Barton is a 75 y.o. male with a history of AICD placement, who is being seen today for the evaluation of syncope and MSSA bacteremia at the request of Dr. Mortimer Fries.  Past Medical History   Past Medical History:  Diagnosis Date  . AICD (automatic cardioverter/defibrillator) present    a. 03/23/2015 s/p MDT DC AICD - placed @ [place], [place].  Marland Kitchen CAD (coronary artery disease)    a. report of MI - unclear of procedures performed.  . Insulin dependent type 2 diabetes mellitus ([place])     History reviewed. No pertinent surgical history.   Allergies  Not on File  History of Present Illness    75 y/o ? with the above PMH (likely incomplete - limited info available).  Pt currently intubated and sedated.  History obtained from son.  Pt believed to have a h/o CAD and MI.  Known to have a h/o insulin dependent DM.  He was previously living in [facility] and underwent MDT ICD placement in 03/2015 - son isn't clear as to indication of placement.  Son says that pt was homeless when they reconnected a few yrs ago, and son moved him up to the [place] area about 2-3 yrs ago.  Pt established care @ the [facility], but hasn't been seen in some time.  Only medication that son knows pt was on, is insulin, but aware he was on others, just doesn't know names.  Son spoke to pt on the evening of 1/11 to say goodnight.  They normally speak a few times per day, and at least once by 3pm.  On 1/12, son was busy and by 6p, still hadn't heard from pt.  He called pt several times but after no answer, he went over to his home later in the evening.  He found his father face down on the kitchen floor and surrounded by urine.  All of the lights  were out in the home and water was running in a sink in the bedroom.  Pt was initially responsive but seemed confused.  EMS was called and enroute to the hospital, patient became more somnolent.  Upon arrival to the emergency department, he was arousable with mild physical stimulation but remained confused.  He was noted to be dozing frequently.  Initial ECG showed sinus rhythm with prior anterior infarct but no acute ST or T changes (V6 not seen on ECG).  COVID neg.  Presenting labs notable for a glucose of 1346, BUN 162, creatinine 4.34, potassium 6.7, high-sensitivity troponin I 50, WBCs 26.2, H&H 7.1/22.0, and INR 1.4.  Venous blood gas notable for pH of 7.19, PCO2 of 36, and bicarb of 13.8.  CK was nl @ 149.  Chest x-ray with low lung volumes and borderline cardiomegaly.  He was treated for hyperglycemia and hyperkalemia.  Stool was guaiac positive and serial hemoglobins revealed a drop to 6.5 by 2:00 in the morning on the 13th, requiring transfusion.  He was admitted to ICU.  In the early morning hours of January 13, he became more obtunded with agonal breathing.  Bradycardia down into the 30s was noted and in the setting of pulselessness, chest compressions were initiated.  ROSC achieved after one round of CPR.  Pt was intubated and sedated. Blood cultures positive for staph aureus.  Seen by infectious disease and placed on cefazolin.  We have been asked to evaluate in the setting of bacteremia and AICD in situ.  Pt remains intubated and sedated.  He has had recurrent anemia and is due for repeat PRBCs today.  He has also developed atrial flutter with controlled ventricular rate.    Inpatient Medications    . chlorhexidine gluconate (MEDLINE KIT)  15 mL Mouth Rinse BID  . Chlorhexidine Gluconate Cloth  6 each Topical Q0600  . insulin aspart  0-9 Units Subcutaneous Q4H  . insulin detemir  34 Units Subcutaneous Q12H  . mouth rinse  15 mL Mouth Rinse 10 times per day  . midazolam  2 mg Intravenous Once   . pantoprazole (PROTONIX) IV  40 mg Intravenous Q12H    Family History    History reviewed. No pertinent family history. He indicated that his mother is deceased. He indicated that his father is deceased.   Social History    Social History   Socioeconomic History  . Marital status: Widowed    Spouse name: Not on file  . Number of children: Not on file  . Years of education: Not on file  . Highest education level: Not on file  Occupational History  . Not on file  Tobacco Use  . Smoking status: Unknown If Ever Smoked  . Smokeless tobacco: Not on file  Substance and Sexual Activity  . Alcohol use: Not Currently  . Drug use: Not on file  . Sexual activity: Not on file  Other Topics Concern  . Not on file  Social History Narrative   Was living in [place] - homeless.  Son moved him up to [place] ~ 2-3 yrs ago.  Pt lives by himself but son nearby and w/ close/frequent contact.   Social Determinants of Health   Financial Resource Strain: Not on file  Food Insecurity: Not on file  Transportation Needs: Not on file  Physical Activity: Not on file  Stress: Not on file  Social Connections: Not on file  Intimate Partner Violence: Not on file     Review of Systems    Patient is intubated and sedated and unable to provide a complete review of systems.  Per son, he was initially unresponsive and then minimally responsive.  No specific complaints.  Physical Exam    Blood pressure (!) 117/56, pulse 95, temperature (!) 101.5 F (38.6 C), temperature source Bladder, resp. rate 16, height $RemoveBe'5\' 8"'ojPNUgTJS$  (1.727 m), weight 110.1 kg, SpO2 96 %.  General: Intubated, sedated. Psych: Sedated Neuro: Sedated HEENT: Normal  Neck: Supple without bruits or JVD. Lungs:  Resp regular and unlabored, diminished breath sounds with scattered rhonchi. Heart: RRR, distant, no s3, s4, or murmurs. Abdomen: Soft, non-tender, non-distended, BS + x 4.  Extremities: No clubbing, cyanosis, edema. DP/PT1+,  Radials 2+ and equal bilaterally.  Labs    Cardiac Enzymes Recent Labs  Lab 05/06/2020 2222 05/13/20 0026 05/13/20 0218 05/13/20 0548  TROPONINIHS 50* 47* 52* 67*      Lab Results  Component Value Date   WBC 18.6 (H) 05/14/2020   HGB 7.5 (L) 05/14/2020   HCT 21.5 (L) 05/14/2020   MCV 89.6 05/14/2020   PLT 134 (L) 05/14/2020    Recent Labs  Lab 05/14/20 0333  NA 147*  K 3.9  CL 111  CO2 22  BUN 119*  CREATININE 4.46*  CALCIUM 8.0*  PROT 4.8*  BILITOT  0.9  ALKPHOS 46  ALT 10  AST 21  GLUCOSE 289*   Lab Results  Component Value Date   TRIG 386 (H) 05/13/2020    Radiology Studies    DG Abd 1 View  Result Date: 05/13/2020 CLINICAL DATA:  Orogastric tube placement EXAM: ABDOMEN - 1 VIEW COMPARISON:  None. FINDINGS: Tip and side port of the orogastric tube project within the stomach. No dilated small bowel is visible. IMPRESSION: Orogastric tube tip and side port in the stomach. Electronically Signed   By: Ulyses Jarred M.D.   On: 05/13/2020 03:46   CT HEAD WO CONTRAST  Result Date: 05/13/2020 CLINICAL DATA:  75 year old male status post cardiopulmonary arrest. Altered mental status. EXAM: CT HEAD WITHOUT CONTRAST TECHNIQUE: Contiguous axial images were obtained from the base of the skull through the vertex without intravenous contrast. COMPARISON:  Head CT yesterday. 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 Patchy and confluent bilateral cerebral white matter hypodensity appears stable, including suspected chronic white matter encephalomalacia in the left PCA territory. Gray-white matter differentiation elsewhere is stable, including mild heterogeneity also in the left thalamus. Stable ventricle size, sulci. No midline shift, mass effect, evidence of mass lesion, intracranial hemorrhage or evidence of cortically based acute infarction. Vascular: Calcified atherosclerosis at the skull base. Presumed  calcified atherosclerosis along the proximal ACAs, stable. No suspicious intracranial vascular hyperdensity. Skull: No acute osseous abnormality identified. Sinuses/Orbits: Increased ethmoid and sphenoid sinus mucosal thickening and small fluid levels. Tympanic cavities and mastoids remain clear. Other: Intubated on the scout view. Small volume fluid in the nasopharynx. No acute orbit or scalp soft tissue finding. IMPRESSION: Stable non contrast CT appearance of the brain. No CT evidence of anoxic injury. 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 Electronically Signed   By: Genevie Ann M.D.   On: 05/13/2020 04:45   CT Head Wo Contrast  Result Date: 05/05/2020 CLINICAL DATA:  Mental status change, found unresponsive 1 hour prior to admission EXAM: CT HEAD WITHOUT CONTRAST TECHNIQUE: Contiguous axial images were obtained from the base of the skull through the vertex without intravenous contrast. COMPARISON:  None. FINDINGS: Brain: Encephalomalacia in the right thalamus compatible sequela prior remote infarct. No evidence of acute infarction, hemorrhage, hydrocephalus, extra-axial collection, visible mass lesion or mass effect. Symmetric prominence of the ventricles, cisterns and sulci compatible with moderate parenchymal volume loss and ex vacuo dilatation of the ventricles. Extensive patchy and confluent areas of white matter hypoattenuation are most compatible with chronic microvascular angiopathy. Vascular: Hyperdensity in the region of the anterior cerebral arteries along the inter hemispheric fissure (3/11) could reflect some chronic calcified plaque or thrombus. No other concerning hyperdense vessels. Atherosclerotic plaque in the carotid siphons. Skull: Evaluation of the skull base limited by motion artifact despite multiple attempts at acquisition. Mild left frontoparietal scalp swelling. No calvarial fracture or suspicious osseous lesion. Sinuses/Orbits: Paranasal sinuses and mastoid air  cells are predominantly clear. Included orbital structures are unremarkable. Other: None IMPRESSION: 1. Hyperdensity in the region of the anterior cerebral arteries along the inter hemispheric fissure could reflect some chronic calcified plaque or possibly remote calcified thrombus. 2. Mild left frontoparietal scalp swelling. No calvarial fracture. 3. No other acute intracranial abnormality. 4. 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 5. Moderate parenchymal volume loss and chronic microvascular angiopathy. Electronically Signed   By: Lovena Le M.D.   On: 05/27/2020 23:37   DG Chest [facility]  1 View  Result Date: 05/13/2020 CLINICAL DATA:  Intubation EXAM: PORTABLE CHEST 1 VIEW COMPARISON:  05/01/2020 FINDINGS: Support Apparatus: --Endotracheal tube: Tip just below the level of the clavicular heads. --Enteric tube:Tip and sideport project in the stomach. --Catheter(s):Right internal jugular vein approach central venous catheter tip is at the cavoatrial junction. --Other: Unchanged position of left chest wall AICD leads. The heart size and mediastinal contours are within normal limits. The lungs are clear. No pleural effusion or pneumothorax. IMPRESSION: Endotracheal tube tip just below the level of the clavicular heads. Electronically Signed   By: Ulyses Jarred M.D.   On: 05/13/2020 03:48   DG Chest Port 1 View  Result Date: 05/24/2020 CLINICAL DATA:  Questionable sepsis - evaluate for abnormality Found unresponsive. EXAM: PORTABLE CHEST 1 VIEW COMPARISON:  None. FINDINGS: Dual lead left-sided pacemaker in place with leads projecting over the right atrium and ventricle. Lung volumes are low. Borderline cardiomegaly with normal mediastinal contours for technique. No pulmonary edema. No focal airspace disease. Subsegmental atelectasis at the bases. No pleural fluid or pneumothorax. IMPRESSION: Low lung volumes with borderline cardiomegaly. Left-sided pacemaker in  place. Electronically Signed   By: Keith Rake M.D.   On: 05/09/2020 22:25    ECG & Cardiac Imaging    RSR, 70, LAD, prior ant infarct - personally reviewed.  Assessment & Plan    1.  Sepsis/bacteremia: Blood cultures positive for staph aureus.  Currently on cefazolin and seen by infectious disease.  In light of AICD in situ, transthoracic echocardiogram performed showing RV lead thickening and low normal LV function (50-55%).  TEE recommended.  I did discuss this at length with his son today, including risks and benefits, and he would like for [place] to proceed if necessary.  Can likely plan for Monday.  2.  Diabetic ketoacidosis: In the setting of #1.  Insulin volume management per critical care medicine.  3.  Acute hypoxic respiratory failure: Vent management per critical care team.  COVID-negative.  4.  Septic shock/hypotension: Remains on vasopressor therapy-currently stable.  5.  Normocytic anemia: Anemic on arrival with drop in H&H shortly thereafter requiring 2 units of packed red blood cells.  Hemoglobin 7.5 with hematocrit of 21.5 today and more PRBCs ordered.  Hemoccult positive.  Seen by GI.  Endoscopy to be considered.  6.  Atrial flutter: Patient presented in sinus rhythm but subsequently developed atrial flutter with controlled ventricular response.  I spoke to his son about this.  His son is unaware of any prior history of atrial arrhythmias and does not believe that his father is on anticoagulation at home.  He is not currently an anticoagulation candidate in the setting of ongoing anemia with question of GI bleeding.  As rates are stable, no additional rate management is warranted at this time.  CHA2DS2-VASc is unclear.  7.  History of myocardial infarction/demand ischemia: Patient's son reports that he knows his father had at least 1 heart attack and has a history of coronary disease.  He is unsure as to therapies/PCI.  Troponin here is only minimally elevated at 67.  As  above, echo with low normal EF of 50-55%.  No plan for ischemic evaluation at this time.  Not currently a candidate for aspirin in the setting of anemia and possibly bleeding.  No beta-blocker in the setting of hypotension requiring vasopressor therapy.  8.  History of AICD: Placed in [place], [place] in 2016.  Son is unsure of events that led up to placement.  I  did discuss with [employer] representative as well as nursing staff and we will try and have nursing staff obtain [facility] express unit from the ED to transmit interrogation tonight.    Signed, Murray Hodgkins, NP 05/14/2020, 4:47 PM  For questions or updates, please contact   Please consult [email or web address] for contact info under Cardiology/STEMI.

## 2020-05-14 NOTE — Plan of Care (Signed)
  Problem: Clinical Measurements: Goal: Will remain free from infection Outcome: Progressing Goal: Respiratory complications will improve Outcome: Progressing Goal: Cardiovascular complication will be avoided Outcome: Progressing   Problem: Pain Managment: Goal: General experience of comfort will improve Outcome: Progressing   Problem: Safety: Goal: Ability to remain free from injury will improve Outcome: Progressing   

## 2020-05-14 NOTE — Progress Notes (Signed)
Initial Nutrition Assessment  DOCUMENTATION CODES:   Obesity unspecified  INTERVENTION:  Once okay to initiate TFs pending GI assessment recommend: -Vital AF 1.2 Cal at 45 mL/hr (1080 mL goal daily volume) per tube -PROSource TF 90 mL TID per tube -Goal regimen provides 1536 kcal, 147 grams of protein, 875 mL H2O daily -MVI daily per tube  NUTRITION DIAGNOSIS:   Inadequate oral intake related to inability to eat as evidenced by NPO status.  GOAL:   Patient will meet greater than or equal to 90% of their needs  MONITOR:   Vent status,Labs,Weight trends,TF tolerance,I & O's  REASON FOR ASSESSMENT:   Ventilator    ASSESSMENT:   75 year old male with unknown PMHx admitted with AMS found to be in DKA vs HHS, also with MSSA bacteremia.   1/13 intubated  Patient is currently intubated on ventilator support MV: 8 L/min Temp (24hrs), Avg:99.3 F (37.4 C), Min:98.96 F (37.2 C), Max:100.94 F (38.3 C)  Medications reviewed and include: Novolog 0-9 units Q4hrs, Levemir 34 units Q12hrs, Protonix, cefazolin, fentanyl gtt, Versed gtt, norepinephrine gtt at 10 mcg/min during RD assessment, vasopressin gtt at 0.03 units/min.  Labs reviewed: CBG 278-283, Sodium 147, BUN 119, Creatinine 4.46.  I/O: 1075 mL UOP yesterday (0.4 mL/kg/hr)  Weight trend: pt currently 110.1 kg (242.73 lbs); no wt hx PTA to trend  Enteral Access: OGT placed 1/13; terminates in stomach per abdominal x-ray 1/13  Discussed with RN and on rounds. Plan is to hold tube feeds pending GI assessment as patient may have GI bleed.  NUTRITION - FOCUSED PHYSICAL EXAM:  Flowsheet Row Most Recent Value  Orbital Region No depletion  Upper Arm Region No depletion  Thoracic and Lumbar Region No depletion  Buccal Region Unable to assess  Temple Region No depletion  Clavicle Bone Region No depletion  Clavicle and Acromion Bone Region Mild depletion  Scapular Bone Region Unable to assess  Dorsal Hand No depletion   Patellar Region No depletion  Anterior Thigh Region No depletion  Posterior Calf Region No depletion  Edema (RD Assessment) Mild  Hair Reviewed  Eyes Unable to assess  Mouth Unable to assess  Skin Reviewed  Nails Reviewed     Diet Order:   Diet Order            Diet NPO time specified  Diet effective now                EDUCATION NEEDS:   No education needs have been identified at this time  Skin:  Skin Assessment: Reviewed RN Assessment  Last BM:  05/14/2019 per chart  Height:   Ht Readings from Last 1 Encounters:  05/03/2020 5\' 8"  (1.727 m)   Weight:   Wt Readings from Last 1 Encounters:  05/14/20 110.1 kg   Ideal Body Weight:  70 kg  BMI:  Body mass index is 36.91 kg/m.  Estimated Nutritional Needs:   Kcal:  1211-1541 (11-14 kcal/kg)  Protein:  140 grams (2 grams/kg IBW)  Fluid:  2 L/day  05/16/20, MS, RD, LDN Pager number available on Amion

## 2020-05-14 NOTE — Progress Notes (Signed)
NAME:  Billy Barton, MRN:  [id number], DOB:  1946/03/31, LOS: 2 ADMISSION DATE:  05/30/2020, CONSULTATION DATE:  05/27/2020 REFERRING MD:  ED , CHIEF COMPLAINT:  Unresponsive   Brief History:  75yo male w/unknown past medical history who presented to the ED w/AMS and was found to be in DKA vs HSS with a blood sugar >600 and lactate 5.8  History of Present Illness:  75yo male w/unknown past medical history who presented to the ED via EMS after the son found him unresponsive, face down in the kitchen, incontinent of urine. Per the ED, the patient son reported to them they call and check on him daily and last spoke with him yesterday. When EMS arrived, the patient was awake but became unresponsive en route to the ED.   On arrival to the ED, the patient was arousable only to painful stimuli, hypothermic 94 otherwise stable vital signs. Labs revealed a blood glucose > 600, lactate 5.8, WBC 26.2, Hgb 7.1 and VBG: 7.19/36 and bicarb 13. He was given 1L LR bolus, started on endotool and antibiotics were initiated. Rapid COVID test negative  ICU was called for admission.   05/14/20 - patient with MSSA bacteremia, critially ill on mechanical ventilator.  Remains on vasopressor. Insulin gtt is off today.  Active GI bleed s/p PRBC transfusion.  Past Medical History:  Unknown at this time, will need to speak with family when they arrive  Significant Hospital Events:  1/13-Admitted to ICU  Consults:  None  Procedures:  None   Significant Diagnostic Tests:  Rapid COVID negative  Micro Data:  Blood cultures sent 1/13: Pending  Antimicrobials:  Cefepime>>1/13 Flagyl>>1.13 Vancomycin >>1/13  Interim History / Subjective:  Arousable to voice and able to say name Confused Hypothermic w/bear hugger on VSS  Objective   Blood pressure (!) 124/54, pulse 62, temperature 98.96 F (37.2 C), resp. rate 15, height 5\' 8"  (1.727 m), weight 110.1 kg, SpO2 96 %.    Vent Mode: PRVC FiO2 (%):  [35 %]  35 % Set Rate:  [16 bmp] 16 bmp Vt Set:  [500 mL] 500 mL PEEP:  [5 cmH20] 5 cmH20   Intake/Output Summary (Last 24 hours) at 05/14/2020 1002 Last data filed at 05/14/2020 0800 Gross per 24 hour  Intake 4537.42 ml  Output 1575 ml  Net 2962.42 ml   Filed Weights   05/29/2020 2206 05/14/20 0342  Weight: 127 kg 110.1 kg    Examination: General:age appropriate HENT: dry oral mucosa, normocephalic Lungs: Lungs with rhonchi and MV sounds Cardiovascular: Heart sounds S1S2, +1 pitting edema Abdomen: soft, non-distended Extremities: warm, palpable pulses Neuro: intubated on sedation with GCS4T GU: No urine output yet. Foley to be placed  Resolved Hospital Problem list     Assessment & Plan:  Diabetic Ketoacidosis           Due to sepsis bacteremia --Blood glucose 1338 via BMP --UA pending --Received 1L LR in ED --Additional 1L LR given --LR at 150cc, add dextrose once BS <250 --Endotool --BMP, Lactates Q4hr --Monitor lytes    -Levemir, ISS -    Metabolic acidosis --2/2 above --Trend ABG/Lactates --Fluids given --Will hold off on Bicarb administration for now  Hyperkalemia- improved --K 6.7 --Receiving IV insulin at 15units/hr via endotool --Received IVFs --Trending BMP q4hr --No EKG changes at this time  Acute Kidney Injury --in the setting of HSS --Baseline Cr unknown --Cr 4.34 on admission --Continue aggressive fluid replacement --Avoid nephrotoxins --Monitor I&Os, place foley         -  nephrology on case - appreciate input   Acute blood loss anemia --hgb 7.1 on arrival --Per ED, the did a stool guiac which was positive --ED ordered 2units of PRBC and started protonix gtt --Serial CBCs --Monitor for now, if starts to have active bleeding will need to consult GI 05/14/20- s/p PRBc transfusion - 2 more units today          GI consult.    Septic shock    Present on admission - due to staph aureus bacteremia    -ID on case - appreciate input     -s/p 1L  LR yesterday - will deliver more IVF today    S/p 1 unit of prbc  Best practice (evaluated daily)  Diet: NPO Pain/Anxiety/Delirium protocol (if indicated): N/A VAP protocol (if indicated): N/A DVT prophylaxis: SCDs only, hold chemical prophylaxis given Acute blood loss anemia GI prophylaxis: Protonix gtt Glucose control: Endotool Mobility: PT consult in AM Disposition:ICU  Goals of Care:  Last date of multidisciplinary goals of care discussion:N/A Family and staff present: N/A Summary of discussion: N/A Follow up goals of care discussion due: N/A Code Status: Full  Labs   CBC: Recent Labs  Lab 06/02/20 2222 05/13/20 0026 05/13/20 1000 05/13/20 1445 05/13/20 1830 05/13/20 2245 05/14/20 0838  WBC 26.2*   < > 31.2* 30.4* 25.6* 22.4* 18.6*  NEUTROABS 21.6*  --   --   --   --   --   --   HGB 7.1*   < > 7.6* 7.5* 6.8* 7.3* 7.5*  HCT 22.0*   < > 22.4* 21.6* 19.8* 21.4* 21.5*  MCV 99.5   < > 90.3 90.0 90.0 90.3 89.6  PLT 227   < > 221 217 167 149* 134*   < > = values in this interval not displayed.    Basic Metabolic Panel: Recent Labs  Lab 05/13/20 0548 05/13/20 1000 05/13/20 1445 05/13/20 1830 05/13/20 2245 05/14/20 0333  NA 138 142 148* 147*  --  147*  K 3.5 3.1* 3.0* 3.3*  --  3.9  CL 111 112* 112* 111  --  111  CO2 16* 18* 20* 22  --  22  GLUCOSE 684* 354* 195* 192*  --  289*  BUN 147* 148* 138* 130*  --  119*  CREATININE 3.50* 3.86* 4.04* 4.26*  --  4.46*  CALCIUM 9.3 9.2 9.0 8.6*  --  8.0*  MG  --   --   --   --  1.8 1.9  PHOS  --   --   --   --  2.5 3.0   GFR: Estimated Creatinine Clearance: 17.5 mL/min (A) (by C-G formula based on SCr of 4.46 mg/dL (H)). Recent Labs  Lab 06/02/20 2222 05/13/20 0026 05/13/20 0218 05/13/20 0548 05/13/20 1000 05/13/20 1445 05/13/20 1830 05/13/20 2245 05/14/20 0838  WBC 26.2* 21.4* 29.4* 27.2*   < > 30.4* 25.6* 22.4* 18.6*  LATICACIDVEN 5.8* 2.8* 3.4* 2.0*  --   --   --   --   --    < > = values in this interval  not displayed.    Liver Function Tests: Recent Labs  Lab 02-Jun-2020 2222 05/14/20 0333  AST 24 21  ALT 11 10  ALKPHOS 68 46  BILITOT 0.9 0.9  PROT 5.2* 4.8*  ALBUMIN 2.5* 2.1*   No results for input(s): LIPASE, AMYLASE in the last 168 hours. No results for input(s): AMMONIA in the last 168 hours.  ABG    Component  Value Date/Time   PHART 7.40 05/14/2020 0333   PCO2ART 32 05/14/2020 0333   PO2ART 87 05/14/2020 0333   HCO3 19.8 (L) 05/14/2020 0333   ACIDBASEDEF 4.6 (H) 05/14/2020 0333   O2SAT 96.6 05/14/2020 0333     Coagulation Profile: Recent Labs  Lab 05-14-20 2222 05/13/20 0218  INR 1.4* 1.5*    Cardiac Enzymes: Recent Labs  Lab May 14, 2020 2222  CKTOTAL 149    HbA1C: No results found for: HGBA1C  CBG: Recent Labs  Lab 05/13/20 1539 05/13/20 1911 05/13/20 2041 05/13/20 2150 05/14/20 0722  GLUCAP 186* 174* 176* 178* 283*    Review of Systems:   Unable to assess due to clinical condition  Past Medical History:  He,  has no past medical history on file.   Surgical History:  Unknown   Social History:      Family History:  His family history is not on file.   Allergies Not on File   Home Medications  Prior to Admission medications   Not on File     Critical care time: 2     Critical care provider statement:    Critical care time (minutes):  33   Critical care time was exclusive of:  Separately billable procedures and  treating other patients   Critical care was necessary to treat or prevent imminent or  life-threatening deterioration of the following conditions:  GI bleed , septic shock, bacteremia, acute hypoxemic respiratory failure.    Critical care was time spent personally by me on the following  activities:  Development of treatment plan with patient or surrogate,  discussions with consultants, evaluation of patient's response to  treatment, examination of patient, obtaining history from patient or  surrogate, ordering and  performing treatments and interventions, ordering  and review of laboratory studies and re-evaluation of patient's condition   I assumed direction of critical care for this patient from another  provider in my specialty: no      Vida Rigger, M.D.  Pulmonary & Critical Care Medicine  [facility]

## 2020-05-14 NOTE — Progress Notes (Signed)
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 Once he recovered his son brought him to [facility] Son says he could be drinking 2-3 beer + 2 mixed drinks/day Son thinks he may have been taking aleve 1-2/day for his knee but son is not sure- He has received three covid vaccines  Remains intubated Sedated Pressors Thick white secretion from ETT  Patient Vitals for the past 24 hrs:  BP Temp Temp src Pulse Resp SpO2 Weight  05/14/20 1530 - - Bladder - - - -  05/14/20 1517 (!) 117/56 (!) 101.5 F (38.6 C) Bladder 95 16 - -  05/14/20 1515 (!) 117/56 (!) 101.48 F (38.6 C) Bladder 88 15 96 % -  05/14/20 1500 - - Bladder - - - -  05/14/20 0800 - - Bladder - - - -  05/14/20 0630 (!) 124/54 98.96 F (37.2 C) - 62 15 96 % -  05/14/20 0600 (!) 126/53 98.96 F (37.2 C) - 65 16 96 % -  05/14/20 0530 (!) 144/53 99.14 F (37.3 C) - 68 16 95 % -  05/14/20 0500 (!) 136/58 98.96 F (37.2 C) - 77 (!) 9 95 % -  05/14/20 0430 (!) 154/55 99.14 F (37.3 C) Bladder 82 15 95 % -  05/14/20 0400 131/62 99.5 F (37.5 C) - 66 16 98 % -  05/14/20 0342 - - - - - - 110.1 kg  05/14/20 0330 (!) 133/48 99.5 F (37.5 C) - 66 16 98 % -  05/14/20 0300 (!) 137/51 99.5 F (37.5 C) - 66 16 98 % -  05/14/20 0230 (!) 128/50 99.5 F (37.5 C) - 69 16 98 % -  05/14/20 0200 (!) 130/51 99.5 F (37.5 C) - 64 16 98 % -  05/14/20 0130 (!) 129/51 99.5 F (37.5 C) - 66 16 98 % -  05/14/20 0100 (!) 116/47 99.5 F (37.5 C) - (!) 58 16 98 % -  05/14/20 0030 (!) 130/54 99.32 F (37.4 C) - 61 16 98 % -  05/14/20 0000 (!) 145/58 99.14 F (37.3 C) Bladder 67 12 98 % -  05/13/20 2330 (!) 126/53 99.14 F (37.3 C) - 65 16 91 % -  05/13/20 2312 (!) 119/51 99.14 F (37.3 C) Bladder 71 16 98 % -  05/13/20 2300 (!) 120/46 99.14 F (37.3 C) -  (!) 50 16 98 % -  05/13/20 2245 (!) 124/49 99.14 F (37.3 C) - 66 16 99 % -  05/13/20 2230 (!) 127/47 99.14 F (37.3 C) - (!) 59 16 99 % -  05/13/20 2200 (!) 120/50 99.14 F (37.3 C) - (!) 56 16 98 % -  05/13/20 2130 (!) 120/51 99.14 F (37.3 C) - 71 16 99 % -  05/13/20 2100 (!) 124/47 99.14 F (37.3 C) - 75 16 98 % -  05/13/20 2050 (!) 119/45 99.14 F (37.3 C) - 77 16 98 % -  05/13/20 2045 (!) 113/47 99.14 F (37.3 C) Bladder 76 16 98 % -  05/13/20 2035 - 99.14 F (37.3 C) Bladder 70 16 97 % -  05/13/20 2030 (!) 146/50 99.14 F (37.3 C) Bladder 87 16 100 % -  05/13/20 2022 - - - - - 100 % -  05/13/20 2000 (!) 107/47 99.32  F (37.4 C) Bladder 84 16 99 % -  05/13/20 1930 (!) 119/51 99.32 F (37.4 C) - 66 16 99 % -  05/13/20 1900 (!) 111/52 99.32 F (37.4 C) - 85 16 100 % -   Chest b/l air entry OG tube crepts Hss1s2 abd soft Edema extremities Foley catheter Rt IJ triple lumen Back- no decub, no wound Black tarry stool smear on the sheet CNS cannot be assessed  Labs CBC Latest Ref Rng & Units 05/14/2020 05/13/2020 05/13/2020  WBC 4.0 - 10.5 K/uL 18.6(H) 22.4(H) 25.6(H)  Hemoglobin 13.0 - 17.0 g/dL 7.5(L) 7.3(L) 6.8(L)  Hematocrit 39.0 - 52.0 % 21.5(L) 21.4(L) 19.8(L)  Platelets 150 - 400 K/uL 134(L) 149(L) 167    CMP Latest Ref Rng & Units 05/14/2020 05/13/2020 05/13/2020  Glucose 70 - 99 mg/dL 665(L) 935(T) 017(B)  BUN 8 - 23 mg/dL 939(Q) 300(P) 233(A)  Creatinine 0.61 - 1.24 mg/dL 0.76(A) 2.63(F) 3.54(T)  Sodium 135 - 145 mmol/L 147(H) 147(H) 148(H)  Potassium 3.5 - 5.1 mmol/L 3.9 3.3(L) 3.0(L)  Chloride 98 - 111 mmol/L 111 111 112(H)  CO2 22 - 32 mmol/L 22 22 20(L)  Calcium 8.9 - 10.3 mg/dL 8.0(L) 8.6(L) 9.0  Total Protein 6.5 - 8.1 g/dL 4.8(L) - -  Total Bilirubin 0.3 - 1.2 mg/dL 0.9 - -  Alkaline Phos 38 - 126 U/L 46 - -  AST 15 - 41 U/L 21 - -  ALT 0 - 44 U/L 10 - -     Micro 06-10-2020 BC MSSA 1 of 2 sets Also there is a gram neg rod in the anerobic  bottle- spoke to the lab possible proteus  1/13 Urine culture NG  CXR   Impression/recommendation  Acute encephalopathy/AMS  on presentation due to DKA with , hyponatremia CT head no acute findings ? MRI /MRV to look for any SVT or infarct  Hyponatremia- corrected  Uncontrolled DM - with DKA Severe hyperkalemia on presentation hyerkalemia resolved  Cardiac arrest leading to reusc and intubation  Staph aureus bacteremia 1 of 2 set Patient has pacemaker There is no skin source Concern for deep infection-  2 d echo no obvious valve veg Once stable will need TEE Continue cefazolin   Fever- thick secretions-and increasing oxygen need R/o mucous plug, VAP Will order CXR and resp culture R/o aspiration- will add metrondiazole until [facility] and resp culture and CXR can refute the need for metronidazole  Anemia with hb 7.1 on presentation- black tarry colored stool smear on the sheet noted at 5pm - could have upper GI stress bleed- he possibly was taking aleve Is receiving PRBC  AKI- BUN is very high as well  119 /4.46   Discussed the management with his nurse and intensivist Spoke to his son in great detail  ID will follow him peripherally this weekend- call if needed

## 2020-05-14 NOTE — Progress Notes (Signed)
Shift note: Pt monitored closely, sedated to keep synchronous with vent. Pressors infused to maintain BP. I was able to titrate off of levophed, vasopressors continue. Pt is no longer on insulin drip. Blood sugars monitored Q4 hours. Long acting insulin given BID, with short acting coverage via SSI scale.  Remains on ventilator support FI02 increased to 60% (from 30) due to decreased 02 saturations. SCD's removed this shift due to feet toes becoming cold to touch with discolorations noted. Feet are now pink, warm to touch. Pulses remained easily palpable.  2 units blood transfused as ordered (2nd unit in progress), pt noted with low grade temp this shift, infectious disease into assess.

## 2020-05-14 NOTE — Consult Note (Signed)
Wyline Mood , MD [address], [place], [place], [address] [address], [address], [place], [place], [address] Phone: [phone]  Fax: [phone]  Consultation  Referring Provider:     Dr Karna Christmas Primary Care Physician:  Patient, No Pcp Per Primary Gastroenterologist:  None          Reason for Consultation:     Blood in stool  Date of Admission:  2020-05-18 Date of Consultation:  05/14/2020         HPI:   Mozell Hardacre is a 75 y.o. male was admitted on 05/13/2020 with DKA versus HSS with a blood sugar of over thousand 300 and lactate of 5.8.  He was found unresponsive at home.  In the emergency room hemoglobin was 7.1 g.  Admitted to the ICU.  Was found to be in acute renal failure with a creatinine of 4.3 and metabolic acidosis.  Elevated troponin at 50.  AKI has been improving.  Have been called for stool occult is positive anemia. CT head showed no bleed.  The patient is intubated and sedated and cannot provide any history.  I spoke to the nurse taking care of the patient.  No overt blood loss has been noted through the mouth or through stools visibly.  While suctioning yesterday there was some blood-tinged liquid that was aspirated.  I looked at the canister in the room connected to suction and there was no blood in it.  No melena noted. CBC Latest Ref Rng & Units 05/14/2020 05/13/2020 05/13/2020  WBC 4.0 - 10.5 K/uL 18.6(H) 22.4(H) 25.6(H)  Hemoglobin 13.0 - 17.0 g/dL 7.5(L) 7.3(L) 6.8(L)  Hematocrit 39.0 - 52.0 % 21.5(L) 21.4(L) 19.8(L)  Platelets 150 - 400 K/uL 134(L) 149(L) 167   Hemoglobins has been stable for the past 5 draws.  History reviewed. No pertinent surgical history.  Prior to Admission medications   Not on File    History reviewed. No pertinent family history.      Allergies as of 05/18/2020  . (Not on File)    Review of Systems:    Patient intubated and sedated unable to give a history   Physical Exam:  Vital signs in last 24  hours: Temp:  [98.96 F (37.2 C)-100.94 F (38.3 C)] 98.96 F (37.2 C) (01/14 0630) Pulse Rate:  [50-91] 62 (01/14 0630) Resp:  [9-16] 15 (01/14 0630) BP: (105-154)/(45-62) 124/54 (01/14 0630) SpO2:  [91 %-100 %] 96 % (01/14 0630) Arterial Line BP: (65-208)/(41-67) 74/59 (01/14 0600) FiO2 (%):  [35 %] 35 % (01/14 0735) Weight:  [110.1 kg] 110.1 kg (01/14 0342) Last BM Date: 05/13/20 General: Intubated and sedated Head:  Normocephalic and atraumatic. Eyes:   No icterus.   Conjunctiva pink.  Ears: Cannot assess Neck:  Supple; no masses or thyroidomegaly Lungs: Respirations even and unlabored. Lungs clear to auscultation bilaterally.   No wheezes, crackles, or rhonchi.  Heart:  Regular rate and rhythm;  Without murmur, clicks, rubs or gallops Abdomen:  Soft, nondistended, nontender. Normal bowel sounds. No appreciable masses or hepatomegaly.  No rebound or guarding.  Neurologic: Intubated and sedated Psych: Intubated and sedated cannot assess  LAB RESULTS: Recent Labs    05/13/20 1830 05/13/20 2245 05/14/20 0838  WBC 25.6* 22.4* 18.6*  HGB 6.8* 7.3* 7.5*  HCT 19.8* 21.4* 21.5*  PLT 167 149* 134*   BMET Recent Labs    05/13/20 1445 05/13/20 1830 05/14/20 0333  NA 148* 147* 147*  K 3.0* 3.3* 3.9  CL 112*  111 111  CO2 20* 22 22  GLUCOSE 195* 192* 289*  BUN 138* 130* 119*  CREATININE 4.04* 4.26* 4.46*  CALCIUM 9.0 8.6* 8.0*   LFT Recent Labs    05/14/20 0333  PROT 4.8*  ALBUMIN 2.1*  AST 21  ALT 10  ALKPHOS 46  BILITOT 0.9   PT/INR Recent Labs    2020-05-14 2222 05/13/20 0218  LABPROT 16.6* 17.1*  INR 1.4* 1.5*    STUDIES: DG Abd 1 View  Result Date: 05/13/2020 CLINICAL DATA:  Orogastric tube placement EXAM: ABDOMEN - 1 VIEW COMPARISON:  None. FINDINGS: Tip and side port of the orogastric tube project within the stomach. No dilated small bowel is visible. IMPRESSION: Orogastric tube tip and side port in the stomach. Electronically Signed   By: Deatra Robinson M.D.   On: 05/13/2020 03:46   CT HEAD WO CONTRAST  Result Date: 05/13/2020 CLINICAL DATA:  75 year old male status post cardiopulmonary arrest. Altered mental status. EXAM: CT HEAD WITHOUT CONTRAST TECHNIQUE: Contiguous axial images were obtained from the base of the skull through the vertex without intravenous contrast. COMPARISON:  Head CT yesterday. 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 Patchy and confluent bilateral cerebral white matter hypodensity appears stable, including suspected chronic white matter encephalomalacia in the left PCA territory. Gray-white matter differentiation elsewhere is stable, including mild heterogeneity also in the left thalamus. Stable ventricle size, sulci. No midline shift, mass effect, evidence of mass lesion, intracranial hemorrhage or evidence of cortically based acute infarction. Vascular: Calcified atherosclerosis at the skull base. Presumed calcified atherosclerosis along the proximal ACAs, stable. No suspicious intracranial vascular hyperdensity. Skull: No acute osseous abnormality identified. Sinuses/Orbits: Increased ethmoid and sphenoid sinus mucosal thickening and small fluid levels. Tympanic cavities and mastoids remain clear. Other: Intubated on the scout view. Small volume fluid in the nasopharynx. No acute orbit or scalp soft tissue finding. IMPRESSION: Stable non contrast CT appearance of the brain. No CT evidence of anoxic injury. 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 Electronically Signed   By: Odessa Fleming M.D.   On: 05/13/2020 04:45   CT Head Wo Contrast  Result Date: May 14, 2020 CLINICAL DATA:  Mental status change, found unresponsive 1 hour prior to admission EXAM: CT HEAD WITHOUT CONTRAST TECHNIQUE: Contiguous axial images were obtained from the base of the skull through the vertex without intravenous contrast. COMPARISON:  None. FINDINGS: Brain:  Encephalomalacia in the right thalamus compatible sequela prior remote infarct. No evidence of acute infarction, hemorrhage, hydrocephalus, extra-axial collection, visible mass lesion or mass effect. Symmetric prominence of the ventricles, cisterns and sulci compatible with moderate parenchymal volume loss and ex vacuo dilatation of the ventricles. Extensive patchy and confluent areas of white matter hypoattenuation are most compatible with chronic microvascular angiopathy. Vascular: Hyperdensity in the region of the anterior cerebral arteries along the inter hemispheric fissure (3/11) could reflect some chronic calcified plaque or thrombus. No other concerning hyperdense vessels. Atherosclerotic plaque in the carotid siphons. Skull: Evaluation of the skull base limited by motion artifact despite multiple attempts at acquisition. Mild left frontoparietal scalp swelling. No calvarial fracture or suspicious osseous lesion. Sinuses/Orbits: Paranasal sinuses and mastoid air cells are predominantly clear. Included orbital structures are unremarkable. Other: None IMPRESSION: 1. Hyperdensity in the region of the anterior cerebral arteries along the inter hemispheric fissure could reflect some chronic calcified plaque or possibly remote calcified thrombus. 2. Mild left frontoparietal scalp swelling. No calvarial fracture. 3. No  other acute intracranial abnormality. 4. 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 5. Moderate parenchymal volume loss and chronic microvascular angiopathy. Electronically Signed   By: Kreg Shropshire M.D.   On: 05/01/2020 23:37   DG Chest Port 1 View  Result Date: 05/13/2020 CLINICAL DATA:  Intubation EXAM: PORTABLE CHEST 1 VIEW COMPARISON:  05/14/2020 FINDINGS: Support Apparatus: --Endotracheal tube: Tip just below the level of the clavicular heads. --Enteric tube:Tip and sideport project in the stomach. --Catheter(s):Right internal jugular vein approach  central venous catheter tip is at the cavoatrial junction. --Other: Unchanged position of left chest wall AICD leads. The heart size and mediastinal contours are within normal limits. The lungs are clear. No pleural effusion or pneumothorax. IMPRESSION: Endotracheal tube tip just below the level of the clavicular heads. Electronically Signed   By: Deatra Robinson M.D.   On: 05/13/2020 03:48   DG Chest Port 1 View  Result Date: 05/26/2020 CLINICAL DATA:  Questionable sepsis - evaluate for abnormality Found unresponsive. EXAM: PORTABLE CHEST 1 VIEW COMPARISON:  None. FINDINGS: Dual lead left-sided pacemaker in place with leads projecting over the right atrium and ventricle. Lung volumes are low. Borderline cardiomegaly with normal mediastinal contours for technique. No pulmonary edema. No focal airspace disease. Subsegmental atelectasis at the bases. No pleural fluid or pneumothorax. IMPRESSION: Low lung volumes with borderline cardiomegaly. Left-sided pacemaker in place. Electronically Signed   By: Narda Rutherford M.D.   On: 05/11/2020 22:25      Impression / Plan:   Khaleem Burchill is a 75 y.o. y/o male Admitted to the hospital on 05/13/2020 unresponsive found to have a serum glucose of over thousand 300, severe metabolic acidosis, acute renal failure.  Incidentally was found to have a hemoglobin of 7.1 g.  No mention of any blood that was seen outside his body.  CODE BLUE was called in the ER and he was intubated had to undergo CPR.  Found to have staph aureus bacteremia.  Likely gradual drop in hemoglobin since admission due to IV fluids and dilution.  He likely is anemic at his baseline as well.  His troponin is elevated.  Would perform endoscopy if only active blood loss is noted with either blood in the OG tube or hematochezia or melena.  None have been noted so far by nursing.  Stool tests are only to be used for colon cancer screening and not for ruling in or ruling out a GI bleed as they have no  sensitivity or specificity for the same.  We will follow the patient  Thank you for involving me in the care of this patient.      LOS: 2 days   Wyline Mood, MD  05/14/2020, 12:48 PM

## 2020-05-14 NOTE — Progress Notes (Signed)
PHARMACY CONSULT NOTE - FOLLOW UP  Pharmacy Consult for Electrolyte Monitoring and Replacement   Recent Labs: Potassium (mmol/L)  Date Value  05/13/2020 3.3 (L)   Magnesium (mg/dL)  Date Value  16/96/7893 1.8   Calcium (mg/dL)  Date Value  81/05/7508 8.6 (L)   Albumin (g/dL)  Date Value  25/85/2778 2.5 (L)   Phosphorus (mg/dL)  Date Value  24/23/5361 2.5   Sodium (mmol/L)  Date Value  05/13/2020 147 (H)   Assessment: 75 year old male admitted with metabolic acidosis s/t DKA vs HHS. Code Blue called after arrival to ICU and patient intubated. Blood cultures positive for MSSA. Patient remains on insulin and bicarb drips. Pharmacy to manage electrolytes.  1/13 2245 Phos 2.5, Mg 1.8 (WNL)  Goal of Therapy:  Electrolytes WNL, K ~ 4  Plan:  Patient with worsening creatinine. Potassium low s/t insulin and bicarb drips.  Pt received potassium 40 mEq per tube. Additional 40 mEq ordered by provider.  BMP every 4 hours while on insulin drip. Continue to follow duration/rate of bicarb infusion  No Phos or Mg replacement at this time, continue to monitor.  Wayland Denis ,PharmD Clinical Pharmacist 05/14/2020 12:42 AM

## 2020-05-14 NOTE — Progress Notes (Signed)
PHARMACY CONSULT NOTE - FOLLOW UP  Pharmacy Consult for Electrolyte Monitoring and Replacement   Recent Labs: Potassium (mmol/L)  Date Value  05/14/2020 3.9   Magnesium (mg/dL)  Date Value  60/73/7106 1.9   Calcium (mg/dL)  Date Value  26/94/8546 8.0 (L)   Albumin (g/dL)  Date Value  27/06/5007 2.1 (L)   Phosphorus (mg/dL)  Date Value  38/18/2993 3.0   Sodium (mmol/L)  Date Value  05/14/2020 147 (H)   Assessment: 75 year old male admitted with metabolic acidosis s/t DKA vs HHS. Code Blue called after arrival to ICU and patient intubated. Blood cultures positive for MSSA. Patient remains on insulin and bicarb drips. Pharmacy to manage electrolytes.  1/13 2245 Phos 2.5, Mg 1.8 (WNL)  Goal of Therapy:  Electrolytes WNL, K ~ 4  Plan:  Electrolytes stable. No replacement indicated at this time. Will follow up with morning labs.  Pricilla Riffle ,PharmD Clinical Pharmacist 05/14/2020 2:25 PM

## 2020-05-14 NOTE — Progress Notes (Signed)
*  PRELIMINARY RESULTS* Echocardiogram 2D Echocardiogram has been performed.  [name] 05/14/2020, 2:16 PM

## 2020-05-14 NOTE — Progress Notes (Signed)
PHARMACY - PHYSICIAN COMMUNICATION CRITICAL VALUE ALERT - BLOOD CULTURE IDENTIFICATION (BCID)  Billy Barton is an 75 y.o. male who presented to [facility] on 05/14/20 with a chief complaint of found down  Assessment:  Blood culture 1/12 with 1/2 sets (both bottles of one set) with GPC, BCID = MSSA  Addendum: 1/14 Bcx updated to include GNR In anaerobic bottle only.  No BCID  Name of physician (or Provider) Contacted: Dr Rivka Safer  Current antibiotics:cefazolin  Changes to prescribed antibiotics recommended:  Recommendations accepted by provider - ID to see  Results for orders placed or performed during the hospital encounter of 05-14-2020  Blood Culture ID Panel (Reflexed) (Collected: 14-May-2020 10:24 PM)  Result Value Ref Range   Enterococcus faecalis NOT DETECTED NOT DETECTED   Enterococcus Faecium NOT DETECTED NOT DETECTED   Listeria monocytogenes NOT DETECTED NOT DETECTED   Staphylococcus species DETECTED (A) NOT DETECTED   Staphylococcus aureus (BCID) DETECTED (A) NOT DETECTED   Staphylococcus epidermidis NOT DETECTED NOT DETECTED   Staphylococcus lugdunensis NOT DETECTED NOT DETECTED   Streptococcus species NOT DETECTED NOT DETECTED   Streptococcus agalactiae NOT DETECTED NOT DETECTED   Streptococcus pneumoniae NOT DETECTED NOT DETECTED   Streptococcus pyogenes NOT DETECTED NOT DETECTED   A.calcoaceticus-baumannii NOT DETECTED NOT DETECTED   Bacteroides fragilis NOT DETECTED NOT DETECTED   Enterobacterales NOT DETECTED NOT DETECTED   Enterobacter cloacae complex NOT DETECTED NOT DETECTED   Escherichia coli NOT DETECTED NOT DETECTED   Klebsiella aerogenes NOT DETECTED NOT DETECTED   Klebsiella oxytoca NOT DETECTED NOT DETECTED   Klebsiella pneumoniae NOT DETECTED NOT DETECTED   Proteus species NOT DETECTED NOT DETECTED   Salmonella species NOT DETECTED NOT DETECTED   Serratia marcescens NOT DETECTED NOT DETECTED   Haemophilus influenzae NOT DETECTED NOT DETECTED    Neisseria meningitidis NOT DETECTED NOT DETECTED   Pseudomonas aeruginosa NOT DETECTED NOT DETECTED   Stenotrophomonas maltophilia NOT DETECTED NOT DETECTED   Candida albicans NOT DETECTED NOT DETECTED   Candida auris NOT DETECTED NOT DETECTED   Candida glabrata NOT DETECTED NOT DETECTED   Candida krusei NOT DETECTED NOT DETECTED   Candida parapsilosis NOT DETECTED NOT DETECTED   Candida tropicalis NOT DETECTED NOT DETECTED   Cryptococcus neoformans/gattii NOT DETECTED NOT DETECTED   Meth resistant mecA/C and MREJ NOT DETECTED NOT DETECTED    Juliette Alcide, PharmD, BCPS.   Work Cell: [phone] 05/14/2020 2:20 PM

## 2020-05-14 NOTE — Consult Note (Signed)
Pharmacy Antibiotic Note  Billy Barton is an 75 y.o. male who presented to [facility] on 05/26/2020 with a chief complaint of found down. Patient found to have MSSA bacteremia in 1/2 sets. Pharmacy has been consulted for Cefazolin dosing.  Plan: Continue cefazolin 2 g IV q12h. Watch renal function closely. Nephrology consulted.  Height: 5\' 8"  (172.7 cm) Weight: 110.1 kg (242 lb 11.6 oz) IBW/kg (Calculated) : 68.4  Temp (24hrs), Avg:99.3 F (37.4 C), Min:98.96 F (37.2 C), Max:100.4 F (38 C)  Recent Labs  Lab 05/16/2020 2222 05/13/20 0026 05/13/20 0218 05/13/20 0548 05/13/20 1000 05/13/20 1445 05/13/20 1830 05/13/20 2245 05/14/20 0333 05/14/20 0838  WBC 26.2* 21.4* 29.4* 27.2* 31.2* 30.4* 25.6* 22.4*  --  18.6*  CREATININE 4.34* 4.15* 3.83* 3.50* 3.86* 4.04* 4.26*  --  4.46*  --   LATICACIDVEN 5.8* 2.8* 3.4* 2.0*  --   --   --   --   --   --     Estimated Creatinine Clearance: 17.5 mL/min (A) (by C-G formula based on SCr of 4.46 mg/dL (H)).    Not on File  Antimicrobials this admission: Cefepime >> 1/12 x1 Metronidazole >> 1/13 x1 Zosyn>> 1/13 x 1 Vancomycin >> 1500 mg x1 Cefazolin 1/13 >>  Microbiology results: 1/12 BCx: MSSA 1/2 sets  Thank you for allowing pharmacy to be a part of this patient's care.  3/12, PharmD Clinical Pharmacist  05/14/2020 2:30 PM

## 2020-05-15 ENCOUNTER — Inpatient Hospital Stay: Payer: No Typology Code available for payment source

## 2020-05-15 DIAGNOSIS — I4892 Unspecified atrial flutter: Secondary | ICD-10-CM

## 2020-05-15 DIAGNOSIS — K922 Gastrointestinal hemorrhage, unspecified: Secondary | ICD-10-CM

## 2020-05-15 DIAGNOSIS — Z95810 Presence of automatic (implantable) cardiac defibrillator: Secondary | ICD-10-CM

## 2020-05-15 LAB — COMPREHENSIVE METABOLIC PANEL
ALT: 6 U/L (ref 0–44)
AST: 20 U/L (ref 15–41)
Albumin: 1.9 g/dL — ABNORMAL LOW (ref 3.5–5.0)
Alkaline Phosphatase: 48 U/L (ref 38–126)
Anion gap: 10 (ref 5–15)
BUN: 112 mg/dL — ABNORMAL HIGH (ref 8–23)
CO2: 24 mmol/L (ref 22–32)
Calcium: 8.3 mg/dL — ABNORMAL LOW (ref 8.9–10.3)
Chloride: 117 mmol/L — ABNORMAL HIGH (ref 98–111)
Creatinine, Ser: 4.69 mg/dL — ABNORMAL HIGH (ref 0.61–1.24)
GFR, Estimated: 12 mL/min — ABNORMAL LOW (ref >60)
Glucose, Bld: 198 mg/dL — ABNORMAL HIGH (ref 70–99)
Potassium: 3.9 mmol/L (ref 3.5–5.1)
Sodium: 151 mmol/L — ABNORMAL HIGH (ref 135–145)
Total Bilirubin: 0.5 mg/dL (ref 0.3–1.2)
Total Protein: 4.8 g/dL — ABNORMAL LOW (ref 6.5–8.1)

## 2020-05-15 LAB — GLUCOSE, CAPILLARY
Glucose-Capillary: 153 mg/dL — ABNORMAL HIGH (ref 70–99)
Glucose-Capillary: 156 mg/dL — ABNORMAL HIGH (ref 70–99)
Glucose-Capillary: 167 mg/dL — ABNORMAL HIGH (ref 70–99)
Glucose-Capillary: 171 mg/dL — ABNORMAL HIGH (ref 70–99)
Glucose-Capillary: 175 mg/dL — ABNORMAL HIGH (ref 70–99)
Glucose-Capillary: 176 mg/dL — ABNORMAL HIGH (ref 70–99)
Glucose-Capillary: 185 mg/dL — ABNORMAL HIGH (ref 70–99)

## 2020-05-15 LAB — TYPE AND SCREEN
ABO/RH(D): O NEG
Antibody Screen: NEGATIVE
Unit division: 0
Unit division: 0
Unit division: 0
Unit division: 0

## 2020-05-15 LAB — CBC
HCT: 27.4 % — ABNORMAL LOW (ref 39.0–52.0)
Hemoglobin: 9.4 g/dL — ABNORMAL LOW (ref 13.0–17.0)
MCH: 30.6 pg (ref 26.0–34.0)
MCHC: 34.3 g/dL (ref 30.0–36.0)
MCV: 89.3 fL (ref 80.0–100.0)
Platelets: 104 10*3/uL — ABNORMAL LOW (ref 150–400)
RBC: 3.07 MIL/uL — ABNORMAL LOW (ref 4.22–5.81)
RDW: 19.4 % — ABNORMAL HIGH (ref 11.5–15.5)
WBC: 14.6 10*3/uL — ABNORMAL HIGH (ref 4.0–10.5)
nRBC: 0.2 % (ref 0.0–0.2)

## 2020-05-15 LAB — BPAM RBC
Blood Product Expiration Date: 202201182359
Blood Product Expiration Date: 202201192359
Blood Product Expiration Date: 202201192359
Blood Product Expiration Date: 202202042359
ISSUE DATE / TIME: 202201130223
ISSUE DATE / TIME: 202201132023
ISSUE DATE / TIME: 202201141457
ISSUE DATE / TIME: 202201141802
Unit Type and Rh: 9500
Unit Type and Rh: 9500
Unit Type and Rh: 9500
Unit Type and Rh: 9500

## 2020-05-15 LAB — MAGNESIUM: Magnesium: 1.8 mg/dL (ref 1.7–2.4)

## 2020-05-15 LAB — MRSA PCR SCREENING: MRSA by PCR: POSITIVE — AB

## 2020-05-15 LAB — PHOSPHORUS: Phosphorus: 2.8 mg/dL (ref 2.5–4.6)

## 2020-05-15 MED ORDER — MUPIROCIN 2 % EX OINT
1.0000 "application " | TOPICAL_OINTMENT | Freq: Two times a day (BID) | CUTANEOUS | Status: AC
  Administered 2020-05-15 – 2020-05-19 (×10): 1 via NASAL
  Filled 2020-05-15: qty 22

## 2020-05-15 NOTE — Progress Notes (Signed)
Progress Note  Patient Name: Billy Barton Date of Encounter: 05/15/2020  Primary Cardiologist: [place] intubated/sedated.  Hemodynamically stable - norepi off. Still on vasopressin.  Inpatient Medications    Scheduled Meds: . chlorhexidine gluconate (MEDLINE KIT)  15 mL Mouth Rinse BID  . Chlorhexidine Gluconate Cloth  6 each Topical Q0600  . insulin aspart  0-9 Units Subcutaneous Q4H  . insulin detemir  34 Units Subcutaneous Q12H  . mouth rinse  15 mL Mouth Rinse 10 times per day  . midazolam  2 mg Intravenous Once  . mupirocin ointment  1 application Nasal BID  . pantoprazole (PROTONIX) IV  40 mg Intravenous Q12H   Continuous Infusions: . sodium chloride Stopped (05/13/20 0308)  .  ceFAZolin (ANCEF) IV Stopped (05/14/20 2211)  . fentaNYL infusion INTRAVENOUS 100 mcg/hr (05/15/20 0815)  . metronidazole 100 mL/hr at 05/15/20 0700  . midazolam 1 mg/hr (05/15/20 0700)  . norepinephrine (LEVOPHED) Adult infusion Stopped (05/14/20 2355)  . vasopressin 0.01 Units/min (05/15/20 0700)   PRN Meds: acetaminophen, dextrose, docusate sodium, polyethylene glycol, vecuronium   Vital Signs    Vitals:   05/15/20 0630 05/15/20 0700 05/15/20 0752 05/15/20 0800  BP: 121/60 (!) 120/58  114/61  Pulse: 91 91  86  Resp: $Remo'14 13  16  'sGLza$ Temp: (!) 100.58 F (38.1 C) (!) 100.4 F (38 C)  99.32 F (37.4 C)  TempSrc:      SpO2: 95% 97% 93% 94%  Weight:      Height:        Intake/Output Summary (Last 24 hours) at 05/15/2020 0903 Last data filed at 05/15/2020 0700 Gross per 24 hour  Intake 2449.85 ml  Output 1925 ml  Net 524.85 ml   Filed Weights   05/06/2020 2206 05/14/20 0342  Weight: 127 kg 110.1 kg    Physical Exam   GEN: Well nourished, well developed.  Intubated and sedated. HEENT: Grossly normal.  Neck: Supple, obese and difficult to gauge JVP.  No carotid bruits, or masses. Cardiac: Irregular, distant, no obvious murmurs, rubs, or gallops. No  clubbing, cyanosis.  Trace to 1+ bilateral lower extremity edema.  Radials 2+, DP/PT 1+ and equal bilaterally.  Respiratory:  Respirations regular and unlabored, coarse breath sounds throughout. GI: Soft, nontender, nondistended, BS + x 4. MS: no deformity or atrophy. Skin: warm and dry, no rash. Neuro: Sedated and unresponsive. Psych: Sedated and unresponsive.  Labs    Chemistry Recent Labs  Lab 05/02/2020 2222 05/13/20 0026 05/13/20 1830 05/14/20 0333 05/15/20 0558  NA 126*   < > 147* 147* 151*  K 6.7*   < > 3.3* 3.9 3.9  CL 97*   < > 111 111 117*  CO2 13*   < > $R'22 22 24  'HO$ GLUCOSE 1,346*   < > 192* 289* 198*  BUN 162*   < > 130* 119* 112*  CREATININE 4.34*   < > 4.26* 4.46* 4.69*  CALCIUM 8.8*   < > 8.6* 8.0* 8.3*  PROT 5.2*  --   --  4.8* 4.8*  ALBUMIN 2.5*  --   --  2.1* 1.9*  AST 24  --   --  21 20  ALT 11  --   --  10 6  ALKPHOS 68  --   --  46 48  BILITOT 0.9  --   --  0.9 0.5  GFRNONAA 14*   < > 14* 13* 12*  ANIONGAP 16*   < >  $'14 14 10   'Q$ < > = values in this interval not displayed.     Hematology Recent Labs  Lab 05/14/20 0838 05/14/20 2104 05/15/20 0558  WBC 18.6* 13.6* 14.6*  RBC 2.40* 3.03* 3.07*  HGB 7.5* 9.3* 9.4*  HCT 21.5* 26.9* 27.4*  MCV 89.6 88.8 89.3  MCH 31.3 30.7 30.6  MCHC 34.9 34.6 34.3  RDW 18.0* 18.2* 19.4*  PLT 134* 108* 104*    Cardiac Enzymes  Recent Labs  Lab 05/09/2020 2222 05/13/20 0026 05/13/20 0218 05/13/20 0548  TROPONINIHS 50* 47* 52* 67*      Lipids  Lab Results  Component Value Date   TRIG 386 (H) 05/13/2020    HbA1c  Lab Results  Component Value Date   HGBA1C 10.2 (H) 05/13/2020    Radiology    DG Abd 1 View  Result Date: 05/13/2020 CLINICAL DATA:  Orogastric tube placement EXAM: ABDOMEN - 1 VIEW COMPARISON:  None. FINDINGS: Tip and side port of the orogastric tube project within the stomach. No dilated small bowel is visible. IMPRESSION: Orogastric tube tip and side port in the stomach. Electronically  Signed   By: Ulyses Jarred M.D.   On: 05/13/2020 03:46   CT HEAD WO CONTRAST  Result Date: 05/13/2020 CLINICAL DATA:  75 year old male status post cardiopulmonary arrest. Altered mental status. EXAM: CT HEAD WITHOUT CONTRAST TECHNIQUE: Contiguous axial images were obtained from the base of the skull through the vertex without intravenous contrast. COMPARISON:  Head CT yesterday. 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 Patchy and confluent bilateral cerebral white matter hypodensity appears stable, including suspected chronic white matter encephalomalacia in the left PCA territory. Gray-white matter differentiation elsewhere is stable, including mild heterogeneity also in the left thalamus. Stable ventricle size, sulci. No midline shift, mass effect, evidence of mass lesion, intracranial hemorrhage or evidence of cortically based acute infarction. Vascular: Calcified atherosclerosis at the skull base. Presumed calcified atherosclerosis along the proximal ACAs, stable. No suspicious intracranial vascular hyperdensity. Skull: No acute osseous abnormality identified. Sinuses/Orbits: Increased ethmoid and sphenoid sinus mucosal thickening and small fluid levels. Tympanic cavities and mastoids remain clear. Other: Intubated on the scout view. Small volume fluid in the nasopharynx. No acute orbit or scalp soft tissue finding. IMPRESSION: Stable non contrast CT appearance of the brain. No CT evidence of anoxic injury. 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 Electronically Signed   By: Genevie Ann M.D.   On: 05/13/2020 04:45   CT Head Wo Contrast  Result Date: 05/11/2020 CLINICAL DATA:  Mental status change, found unresponsive 1 hour prior to admission EXAM: CT HEAD WITHOUT CONTRAST TECHNIQUE: Contiguous axial images were obtained from the base of the skull through the vertex without intravenous contrast. COMPARISON:  None.  FINDINGS: Brain: Encephalomalacia in the right thalamus compatible sequela prior remote infarct. No evidence of acute infarction, hemorrhage, hydrocephalus, extra-axial collection, visible mass lesion or mass effect. Symmetric prominence of the ventricles, cisterns and sulci compatible with moderate parenchymal volume loss and ex vacuo dilatation of the ventricles. Extensive patchy and confluent areas of white matter hypoattenuation are most compatible with chronic microvascular angiopathy. Vascular: Hyperdensity in the region of the anterior cerebral arteries along the inter hemispheric fissure (3/11) could reflect some chronic calcified plaque or thrombus. No other concerning hyperdense vessels. Atherosclerotic plaque in the carotid siphons. Skull: Evaluation of the skull base limited by motion artifact despite multiple attempts at acquisition. Mild left frontoparietal scalp swelling.  No calvarial fracture or suspicious osseous lesion. Sinuses/Orbits: Paranasal sinuses and mastoid air cells are predominantly clear. Included orbital structures are unremarkable. Other: None IMPRESSION: 1. Hyperdensity in the region of the anterior cerebral arteries along the inter hemispheric fissure could reflect some chronic calcified plaque or possibly remote calcified thrombus. 2. Mild left frontoparietal scalp swelling. No calvarial fracture. 3. No other acute intracranial abnormality. 4. 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 5. Moderate parenchymal volume loss and chronic microvascular angiopathy. Electronically Signed   By: Kreg Shropshire M.D.   On: 05/17/2020 23:37   DG Chest Port 1 View  Result Date: 05/15/2020 CLINICAL DATA:  Acute respiratory failure EXAM: PORTABLE CHEST 1 VIEW COMPARISON:  05/14/2020 FINDINGS: There is a persistent dense retrocardiac opacity which may represent a combination of atelectasis and a left-sided pleural effusion. There is a left-sided  ICD/pacemaker in place. The right-sided central venous catheter is stable in positioning. The enteric tube extends below the left hemidiaphragm. The endotracheal tube terminates above the carina. Perihilar airspace opacities are noted which have worsened since the prior study. IMPRESSION: 1. Lines and tubes as above. 2. Persistent dense retrocardiac opacity. Worsening perihilar airspace opacities. Electronically Signed   By: Katherine Mantle M.D.   On: 05/15/2020 06:02   DG Chest Port 1 View  Result Date: 05/14/2020 CLINICAL DATA:  Altered mental status EXAM: PORTABLE CHEST 1 VIEW COMPARISON:  05/13/2020 FINDINGS: Endotracheal tube tip is about 3.9 cm superior to carina. Esophageal tube tip below the diaphragm but incompletely visualized. Right IJ central venous catheter tip over the SVC. Left-sided pacing device as before. Cardiomegaly. Worsening consolidation/airspace disease at the left base. No pneumothorax IMPRESSION: Worsening consolidation/airspace disease at the left base. Endotracheal tube tip about 3.9 cm superior to carina. Electronically Signed   By: Jasmine Pang M.D.   On: 05/14/2020 18:27   DG Chest Port 1 View  Result Date: 05/13/2020 CLINICAL DATA:  Intubation EXAM: PORTABLE CHEST 1 VIEW COMPARISON:  05/29/2020 FINDINGS: Support Apparatus: --Endotracheal tube: Tip just below the level of the clavicular heads. --Enteric tube:Tip and sideport project in the stomach. --Catheter(s):Right internal jugular vein approach central venous catheter tip is at the cavoatrial junction. --Other: Unchanged position of left chest wall AICD leads. The heart size and mediastinal contours are within normal limits. The lungs are clear. No pleural effusion or pneumothorax. IMPRESSION: Endotracheal tube tip just below the level of the clavicular heads. Electronically Signed   By: Deatra Robinson M.D.   On: 05/13/2020 03:48   DG Chest Port 1 View  Result Date: 05/18/2020 CLINICAL DATA:  Questionable sepsis -  evaluate for abnormality Found unresponsive. EXAM: PORTABLE CHEST 1 VIEW COMPARISON:  None. FINDINGS: Dual lead left-sided pacemaker in place with leads projecting over the right atrium and ventricle. Lung volumes are low. Borderline cardiomegaly with normal mediastinal contours for technique. No pulmonary edema. No focal airspace disease. Subsegmental atelectasis at the bases. No pleural fluid or pneumothorax. IMPRESSION: Low lung volumes with borderline cardiomegaly. Left-sided pacemaker in place. Electronically Signed   By: Narda Rutherford M.D.   On: 05/21/2020 22:25    Telemetry    Atrial flutter, PVCs, 70s to 90s - Personally Reviewed  Cardiac Studies   2D Echocardiogram 1.14.2022   1. Left ventricular ejection fraction, by estimation, is 50 to 55%. The  left ventricle has low normal function. Left ventricular endocardial  border not optimally defined to evaluate regional wall motion. There is  mild left ventricular hypertrophy. Left  ventricular diastolic parameters are indeterminate.   2. Right ventricular systolic function is normal. The right ventricular  size is normal. Tricuspid regurgitation signal is inadequate for assessing  PA pressure.   3. The mitral valve is normal in structure. No evidence of mitral valve  regurgitation. No evidence of mitral stenosis.   4. The aortic valve is normal in structure. Aortic valve regurgitation is  not visualized. No aortic stenosis is present.   5. Aortic dilatation noted. There is moderate dilatation of the ascending  aorta, measuring 45 mm.   6. The inferior vena cava is dilated in size with >50% respiratory  variability, suggesting right atrial pressure of 8 mmHg.   7. RV pacemaker lead is thickend. Can not rule out vegetation. Consider a  TEE   8. Challenging image quality  _____________   Patient Profile     75 y.o. male w/ a h/o AICD placement (2016 - [facility] in [place], [place]), MI/CAD, and DMII, who was admitted 1/13  w/ AMS, sepsis/shock, MSSA bacteremia, DKA, hyperkalemia, acute renal failure, normocytic anemia req PRBCs, acute hypoxic resp failure req intubation, and subsequent development of atrial flutter.  We were initially asked to eval for TEE.  Assessment & Plan    1.  Sepsis/bacteremia: Blood cultures positive for staph aureus.  Seen by infectious disease and on cefazolin.  Flagyl added due to concern for aspiration.  Echocardiogram showed thickened RV lead.  Vegetation cannot be ruled out.  In that setting, I have ordered a TEE for Monday.  I spoke to his son on January 14 to discuss risks and benefits of transesophageal echocardiogram, and obtained consent.  2.  Diabetic ketoacidosis: In the setting of #1.  Insulin and volume management per critical care medicine.  3.  Acute hypoxic respiratory failure: Ventilator management per critical care medicine.  COVID-negative.  4.  Septic shock/hypotension: Off norepinephrine.  Remains on vasopressin therapy.  5.  Normocytic anemia: Anemic on arrival with drop in H&H shortly thereafter requiring 2 units of packed red blood cells.  Received an additional transfusion on the 14th.  H&H stable at 9.4 and 27.4 this morning.  Seen by GI.  Endoscopy to be considered if bleeding noted.  6.  Atrial flutter: Patient presented in sinus rhythm but subsequently developed atrial flutter with controlled ventricular spots.  Per son, he has no prior known history of atrial arrhythmias and his son does not believe that he was on any anticoagulation at home previously.  He is not currently an anticoagulation candidate in the setting of ongoing anemia with question of GI bleeding.  His rates are stable, we will continue conservative therapy.  CHA2DS2VASc is unclear at this point but at least 3 (age, diabetes, history of MI).  7.  History of myocardial infarction/demand ischemia: Patient says he knows that his father had at least 71 MI as well as a history of CAD.  He is unsure as  to therapy/PCI.  Troponin only mildly elevated at 67.  Echo performed on the 14th with low normal EF of 50-55%.  No plan for ischemic evaluation at this time.  Not currently a candidate for aspirin in the setting of anemia and possibly bleeding.  No beta-blocker in the setting of hypotension requiring vasopressin therapy.  Can add statin once taking p.o.'s.  8.  History of AICD: Placed in [place], [place] in 2016.  Son is unsure of events that led up to placement.  I discussed with the [employer] representative on the evening  of the 14th as well as with nursing staff.  Ideally, nursing staff can obtain [facility] express unit from the emergency department to transmit interrogation.  Due to staffing issues, this was not able to be achieved on the 14th.  9.  Hypernatremia: In setting of correction of hyperglycemia.  Fluid management per critical care medicine.  Signed, Murray Hodgkins, NP  05/15/2020, 9:03 AM    For questions or updates, please contact   Please consult [email or web address] for contact info under Cardiology/STEMI.

## 2020-05-15 NOTE — Progress Notes (Signed)
PHARMACY CONSULT NOTE - FOLLOW UP  Pharmacy Consult for Electrolyte Monitoring and Replacement   Recent Labs: Potassium (mmol/L)  Date Value  05/15/2020 3.9   Magnesium (mg/dL)  Date Value  96/29/5284 1.8   Calcium (mg/dL)  Date Value  13/24/4010 8.3 (L)   Albumin (g/dL)  Date Value  27/25/3664 1.9 (L)   Phosphorus (mg/dL)  Date Value  40/34/7425 2.8   Sodium (mmol/L)  Date Value  05/15/2020 151 (H)   Assessment: 75 year old male admitted with metabolic acidosis s/t DKA vs HHS. Code Blue called after arrival to ICU and patient intubated. Blood cultures positive for MSSA. Patient remains on insulin and bicarb drips. Pharmacy to manage electrolytes.  1/13 2245 Phos 2.5, Mg 1.8 (WNL)  Goal of Therapy:  Electrolytes WNL, K ~ 4  Plan:  K 3.9 Mag 1.8 Phos 2.8  Scr 4.69 Electrolytes stable. No replacement indicated at this time. Will follow up with morning labs.  Angelique Blonder ,PharmD Clinical Pharmacist 05/15/2020 2:16 PM

## 2020-05-15 NOTE — Progress Notes (Signed)
NAME:  Billy Barton, MRN:  [id number], DOB:  17-Nov-1945, LOS: 3 ADMISSION DATE:  05/22/2020, CONSULTATION DATE:  05/23/2020 REFERRING MD:  ED , CHIEF COMPLAINT:  Unresponsive   Brief History:  75yo male w/unknown past medical history who presented to the ED w/AMS and was found to be in DKA vs HSS with a blood sugar >600 and lactate 5.8  History of Present Illness:  75yo male w/unknown past medical history who presented to the ED via EMS after the son found him unresponsive, face down in the kitchen, incontinent of urine. Per the ED, the patient son reported to them they call and check on him daily and last spoke with him yesterday. When EMS arrived, the patient was awake but became unresponsive en route to the ED.   On arrival to the ED, the patient was arousable only to painful stimuli, hypothermic 94 otherwise stable vital signs. Labs revealed a blood glucose > 600, lactate 5.8, WBC 26.2, Hgb 7.1 and VBG: 7.19/36 and bicarb 13. He was given 1L LR bolus, started on endotool and antibiotics were initiated. Rapid COVID test negative  ICU was called for admission.   05/14/20 - patient with MSSA bacteremia, critially ill on mechanical ventilator.  Remains on vasopressor. Insulin gtt is off today.  Active GI bleed s/p PRBC transfusion. 05/15/20- patient remains hypotensive on MV.  Possible EGD when more stable - spoke with GI- Dr Lacretia Nicks today. Still with edema 2+ on vasopressors.   Past Medical History:  Unknown at this time, will need to speak with family when they arrive  Significant Hospital Events:  1/13-Admitted to ICU  Consults:  None  Procedures:  None   Significant Diagnostic Tests:  Rapid COVID negative  Micro Data:  Blood cultures sent 1/13: Pending  Antimicrobials:  Cefepime>>1/13 Flagyl>>1.13 Vancomycin >>1/13  Interim History / Subjective:  Arousable to voice and able to say name Confused Hypothermic w/bear hugger on VSS  Objective   Blood pressure (!) 121/58, pulse  89, temperature 98.6 F (37 C), resp. rate 15, height 5\' 8"  (1.727 m), weight 110.1 kg, SpO2 96 %.    Vent Mode: PRVC FiO2 (%):  [50 %-60 %] 50 % Set Rate:  [16 bmp] 16 bmp Vt Set:  [500 mL] 500 mL PEEP:  [5 cmH20] 5 cmH20 Plateau Pressure:  [14 cmH20-18 cmH20] 18 cmH20   Intake/Output Summary (Last 24 hours) at 05/15/2020 1405 Last data filed at 05/15/2020 1237 Gross per 24 hour  Intake 2627.69 ml  Output 1880 ml  Net 747.69 ml   Filed Weights   05/14/2020 2206 05/14/20 0342  Weight: 127 kg 110.1 kg    Examination: General:age appropriate HENT: dry oral mucosa, normocephalic Lungs: Lungs with rhonchi and MV sounds Cardiovascular: Heart sounds S1S2, +1 pitting edema Abdomen: soft, non-distended Extremities: warm, palpable pulses Neuro: intubated on sedation with GCS4T GU: No urine output yet. Foley to be placed  Resolved Hospital Problem list     Assessment & Plan:  Diabetic Ketoacidosis           Due to sepsis bacteremia --Blood glucose 1338 via BMP --UA pending --Received 1L LR in ED --Additional 1L LR given --LR at 150cc, add dextrose once BS <250 --Endotool --BMP, Lactates Q4hr --Monitor lytes    -Levemir, ISS -    Metabolic acidosis --2/2 above --Trend ABG/Lactates --Fluids given --Will hold off on Bicarb administration for now  Hyperkalemia- improved --K 6.7 --Receiving IV insulin at 15units/hr via endotool --Received IVFs --Trending BMP q4hr --No  EKG changes at this time  Acute Kidney Injury --in the setting of HSS --Baseline Cr unknown --Cr 4.34 on admission --Continue aggressive fluid replacement --Avoid nephrotoxins --Monitor I&Os, place foley         - nephrology on case - appreciate input   Acute blood loss anemia --hgb 7.1 on arrival --Per ED, the did a stool guiac which was positive --ED ordered 2units of PRBC and started protonix gtt --Serial CBCs --Monitor for now, if starts to have active bleeding will need to consult  GI 05/14/20- s/p PRBc transfusion - 2 more units today          GI consult.    Septic shock    Present on admission - due to staph aureus bacteremia    -ID on case - appreciate input     -s/p 1L LR yesterday - will deliver more IVF today    S/p 1 unit of prbc  Best practice (evaluated daily)  Diet: NPO Pain/Anxiety/Delirium protocol (if indicated): N/A VAP protocol (if indicated): N/A DVT prophylaxis: SCDs only, hold chemical prophylaxis given Acute blood loss anemia GI prophylaxis: Protonix gtt Glucose control: Endotool Mobility: PT consult in AM Disposition:ICU  Goals of Care:  Last date of multidisciplinary goals of care discussion:N/A Family and staff present: N/A Summary of discussion: N/A Follow up goals of care discussion due: N/A Code Status: Full  Labs   CBC: Recent Labs  Lab 2020/05/19 2222 05/13/20 0026 05/13/20 1830 05/13/20 2245 05/14/20 0838 05/14/20 2104 05/15/20 0558  WBC 26.2*   < > 25.6* 22.4* 18.6* 13.6* 14.6*  NEUTROABS 21.6*  --   --   --   --  10.8*  --   HGB 7.1*   < > 6.8* 7.3* 7.5* 9.3* 9.4*  HCT 22.0*   < > 19.8* 21.4* 21.5* 26.9* 27.4*  MCV 99.5   < > 90.0 90.3 89.6 88.8 89.3  PLT 227   < > 167 149* 134* 108* 104*   < > = values in this interval not displayed.    Basic Metabolic Panel: Recent Labs  Lab 05/13/20 1000 05/13/20 1445 05/13/20 1830 05/13/20 2245 05/14/20 0333 05/15/20 0558  NA 142 148* 147*  --  147* 151*  K 3.1* 3.0* 3.3*  --  3.9 3.9  CL 112* 112* 111  --  111 117*  CO2 18* 20* 22  --  22 24  GLUCOSE 354* 195* 192*  --  289* 198*  BUN 148* 138* 130*  --  119* 112*  CREATININE 3.86* 4.04* 4.26*  --  4.46* 4.69*  CALCIUM 9.2 9.0 8.6*  --  8.0* 8.3*  MG  --   --   --  1.8 1.9 1.8  PHOS  --   --   --  2.5 3.0 2.8   GFR: Estimated Creatinine Clearance: 16.6 mL/min (A) (by C-G formula based on SCr of 4.69 mg/dL (H)). Recent Labs  Lab 19-May-2020 2222 05/13/20 0026 05/13/20 0218 05/13/20 0548 05/13/20 1000  05/13/20 2245 05/14/20 0838 05/14/20 2104 05/15/20 0558  WBC 26.2* 21.4* 29.4* 27.2*   < > 22.4* 18.6* 13.6* 14.6*  LATICACIDVEN 5.8* 2.8* 3.4* 2.0*  --   --   --   --   --    < > = values in this interval not displayed.    Liver Function Tests: Recent Labs  Lab May 19, 2020 2222 05/14/20 0333 05/15/20 0558  AST 24 21 20   ALT 11 10 6   ALKPHOS 68 46 48  BILITOT  0.9 0.9 0.5  PROT 5.2* 4.8* 4.8*  ALBUMIN 2.5* 2.1* 1.9*   No results for input(s): LIPASE, AMYLASE in the last 168 hours. No results for input(s): AMMONIA in the last 168 hours.  ABG    Component Value Date/Time   PHART 7.40 05/14/2020 0333   PCO2ART 32 05/14/2020 0333   PO2ART 87 05/14/2020 0333   HCO3 19.8 (L) 05/14/2020 0333   ACIDBASEDEF 4.6 (H) 05/14/2020 0333   O2SAT 96.6 05/14/2020 0333     Coagulation Profile: Recent Labs  Lab 05/11/2020 2222 05/13/20 0218  INR 1.4* 1.5*    Cardiac Enzymes: Recent Labs  Lab 05/18/2020 2222  CKTOTAL 149    HbA1C: Hgb A1c MFr Bld  Date/Time Value Ref Range Status  05/13/2020 02:49 PM 10.2 (H) 4.8 - 5.6 % Final    Comment:    (NOTE) Pre diabetes:          5.7%-6.4%  Diabetes:              >6.4%  Glycemic control for   <7.0% adults with diabetes     CBG: Recent Labs  Lab 05/14/20 2118 05/14/20 2304 05/15/20 0324 05/15/20 0717 05/15/20 1130  GLUCAP 222* 195* 185* 176* 156*    Review of Systems:   Unable to assess due to clinical condition  Past Medical History:  He,  has a past medical history of AICD (automatic cardioverter/defibrillator) present, CAD (coronary artery disease), and Insulin dependent type 2 diabetes mellitus (HCC).   Surgical History:  Unknown   Social History:   reports previous alcohol use.   Family History:  His family history is not on file.   Allergies Not on File   Home Medications  Prior to Admission medications   Not on File     Critical care time: 37     Critical care provider statement:    Critical  care time (minutes):  33   Critical care time was exclusive of:  Separately billable procedures and  treating other patients   Critical care was necessary to treat or prevent imminent or  life-threatening deterioration of the following conditions:  GI bleed , septic shock, bacteremia, acute hypoxemic respiratory failure.    Critical care was time spent personally by me on the following  activities:  Development of treatment plan with patient or surrogate,  discussions with consultants, evaluation of patient's response to  treatment, examination of patient, obtaining history from patient or  surrogate, ordering and performing treatments and interventions, ordering  and review of laboratory studies and re-evaluation of patient's condition   I assumed direction of critical care for this patient from another  provider in my specialty: no      Vida Rigger, M.D.  Pulmonary & Critical Care Medicine  [facility]

## 2020-05-15 NOTE — Progress Notes (Signed)
Lucilla Lame, MD [facility]   [address] [place], [place] [address] Phone: [phone] Fax : [phone]   Subjective: Patient being followed for a GI bleed.  The patient was found unresponsive by the patient's son and was now in the ICU intubated.  The patient was found to have bacteremia and was diagnosed with a GI bleed.  The patient had a drop in his hemoglobin and was transfused.  His troponin is elevated and his stools were sent off and were reported to be Hemoccult positive and prior to that they were reported to be dark.  The patient also was diagnosed with severe metabolic acidosis and acute renal failure.     Objective: Vital signs in last 24 hours: Vitals:   05/15/20 1100 05/15/20 1200 05/15/20 1300 05/15/20 1435  BP: (!) 113/57 (!) 117/56 (!) 121/58   Pulse: 85 88 89   Resp: $Remo'16 13 15   'KOTcp$ Temp: 98.78 F (37.1 C) 98.6 F (37 C) 98.6 F (37 C)   TempSrc:  Bladder    SpO2: 96% 95% 96% 94%  Weight:      Height:       Weight change:   Intake/Output Summary (Last 24 hours) at 05/15/2020 1446 Last data filed at 05/15/2020 1237 Gross per 24 hour  Intake 2627.69 ml  Output 1880 ml  Net 747.69 ml     Exam: Heart:: Regular rate and rhythm, S1S2 present or without murmur or extra heart sounds Lungs: normal and clear to auscultation and percussion Abdomen: soft, nontender, normal bowel sounds   Lab Results: $RemoveBefo'@[email or web address]'[id number]$ @ Micro Results: Recent Results (from the past 240 hour(s))  Blood culture (routine single)     Status: Abnormal (Preliminary result)   Collection Time: 05/21/2020 10:24 PM   Specimen: BLOOD  Result Value Ref Range Status   Specimen Description   Final    BLOOD LEFT ASSIST CONTROL Performed at [facility], [address], [place], [place] [address]    Special Requests   Final    BOTTLES DRAWN AEROBIC AND ANAEROBIC Blood Culture adequate volume Performed at [facility], [address] [place], [place] [address]    Culture   Setup Time   Final    Organism ID to follow GRAM POSITIVE COCCI IN BOTH AEROBIC AND ANAEROBIC BOTTLES CRITICAL RESULT CALLED TO, READ BACK BY AND VERIFIED WITH: MYRA SLAUGHTER AT 1309 ON 05/13/20 SNG GRAM NEGATIVE RODS ANAEROBIC BOTTLE ONLY CRITICAL RESULT CALLED TO, READ BACK BY AND VERIFIED WITH: MORGAN HICKS PHARMD $RemoveBefo'@[email or web address]$  05/14/20 EB    Culture (A)  Final    STAPHYLOCOCCUS AUREUS GRAM NEGATIVE RODS IDENTIFICATION AND SUSCEPTIBILITIES TO FOLLOW Performed at [facility], [address], [place], [place] [address]    Report Status PENDING  Incomplete   Organism ID, Bacteria STAPHYLOCOCCUS AUREUS  Final      Susceptibility   Staphylococcus aureus - MIC*    CIPROFLOXACIN <=0.5 SENSITIVE Sensitive     ERYTHROMYCIN <=0.25 SENSITIVE Sensitive     GENTAMICIN <=0.5 SENSITIVE Sensitive     OXACILLIN 0.5 SENSITIVE Sensitive     TETRACYCLINE <=1 SENSITIVE Sensitive     VANCOMYCIN 1 SENSITIVE Sensitive     TRIMETH/SULFA <=10 SENSITIVE Sensitive     CLINDAMYCIN <=0.25 SENSITIVE Sensitive     RIFAMPIN <=0.5 SENSITIVE Sensitive     Inducible Clindamycin NEGATIVE Sensitive     * STAPHYLOCOCCUS AUREUS  Culture, blood (single)     Status: None (Preliminary result)   Collection Time: 05/03/2020 10:24  PM   Specimen: BLOOD  Result Value Ref Range Status   Specimen Description BLOOD LEFT HAND  Final   Special Requests   Final    BOTTLES DRAWN AEROBIC AND ANAEROBIC Blood Culture adequate volume   Culture   Final    NO GROWTH 3 DAYS Performed at [facility], [address] [place], [place] [address]    Report Status PENDING  Incomplete  Blood Culture ID Panel (Reflexed)     Status: Abnormal   Collection Time: 05/11/2020 10:24 PM  Result Value Ref Range Status   Enterococcus faecalis NOT DETECTED NOT DETECTED Final   Enterococcus Faecium NOT DETECTED NOT DETECTED Final   Listeria monocytogenes NOT DETECTED NOT DETECTED Final   Staphylococcus species DETECTED (A) NOT DETECTED  Final    Comment: CRITICAL RESULT CALLED TO, READ BACK BY AND VERIFIED WITH: MYRA SLAUGHTER AT 1309 ON 05/13/20 SNG    Staphylococcus aureus (BCID) DETECTED (A) NOT DETECTED Final    Comment: CRITICAL RESULT CALLED TO, READ BACK BY AND VERIFIED WITH: MYRA SLAUGHTER AT 1309 05/13/20 SNG    Staphylococcus epidermidis NOT DETECTED NOT DETECTED Final   Staphylococcus lugdunensis NOT DETECTED NOT DETECTED Final   Streptococcus species NOT DETECTED NOT DETECTED Final   Streptococcus agalactiae NOT DETECTED NOT DETECTED Final   Streptococcus pneumoniae NOT DETECTED NOT DETECTED Final   Streptococcus pyogenes NOT DETECTED NOT DETECTED Final   A.calcoaceticus-baumannii NOT DETECTED NOT DETECTED Final   Bacteroides fragilis NOT DETECTED NOT DETECTED Final   Enterobacterales NOT DETECTED NOT DETECTED Final   Enterobacter cloacae complex NOT DETECTED NOT DETECTED Final   Escherichia coli NOT DETECTED NOT DETECTED Final   Klebsiella aerogenes NOT DETECTED NOT DETECTED Final   Klebsiella oxytoca NOT DETECTED NOT DETECTED Final   Klebsiella pneumoniae NOT DETECTED NOT DETECTED Final   Proteus species NOT DETECTED NOT DETECTED Final   Salmonella species NOT DETECTED NOT DETECTED Final   Serratia marcescens NOT DETECTED NOT DETECTED Final   Haemophilus influenzae NOT DETECTED NOT DETECTED Final   Neisseria meningitidis NOT DETECTED NOT DETECTED Final   Pseudomonas aeruginosa NOT DETECTED NOT DETECTED Final   Stenotrophomonas maltophilia NOT DETECTED NOT DETECTED Final   Candida albicans NOT DETECTED NOT DETECTED Final   Candida auris NOT DETECTED NOT DETECTED Final   Candida glabrata NOT DETECTED NOT DETECTED Final   Candida krusei NOT DETECTED NOT DETECTED Final   Candida parapsilosis NOT DETECTED NOT DETECTED Final   Candida tropicalis NOT DETECTED NOT DETECTED Final   Cryptococcus neoformans/gattii NOT DETECTED NOT DETECTED Final   Meth resistant mecA/C and MREJ NOT DETECTED NOT DETECTED Final     Comment: Performed at [facility], [address] [place], [place] [address]  Urine culture     Status: None   Collection Time: 05/13/20 12:26 AM   Specimen: In/Out Cath Urine  Result Value Ref Range Status   Specimen Description   Final    IN/OUT CATH URINE Performed at [facility], [address] [place], [place] [address]    Special Requests   Final    NONE Performed at [facility], [address] [place], [place] [address]    Culture   Final    NO GROWTH Performed at [facility], [place] [address] [place],  [address]    Report Status 05/14/2020 FINAL  Final  Resp Panel by RT-PCR (Flu A&B, Covid) Nasopharyngeal Swab     Status: None   Collection Time: 05/13/20 12:26  AM   Specimen: Nasopharyngeal Swab; Nasopharyngeal(NP) swabs in vial transport medium  Result Value Ref Range Status   SARS Coronavirus 2 by RT PCR NEGATIVE NEGATIVE Final    Comment: (NOTE) SARS-CoV-2 target nucleic acids are NOT DETECTED.  The SARS-CoV-2 RNA is generally detectable in upper respiratory specimens during the acute phase of infection. The lowest concentration of SARS-CoV-2 viral copies this assay can detect is 138 copies/mL. A negative result does not preclude SARS-Cov-2 infection and should not be used as the sole basis for treatment or other patient management decisions. A negative result may occur with  improper specimen collection/handling, submission of specimen other than nasopharyngeal swab, presence of viral mutation(s) within the areas targeted by this assay, and inadequate number of viral copies(<138 copies/mL). A negative result must be combined with clinical observations, patient history, and epidemiological information. The expected result is Negative.  Fact Sheet for Patients:  EntrepreneurPulse.com.au  Fact Sheet for Healthcare Providers:  IncredibleEmployment.be  This test is no t yet approved  or cleared by the [place] FDA and  has been authorized for detection and/or diagnosis of SARS-CoV-2 by FDA under an Emergency Use Authorization (EUA). This EUA will remain  in effect (meaning this test can be used) for the duration of the COVID-19 declaration under Section 564(b)(1) of the Act, 21 U.S.C.section 360bbb-3(b)(1), unless the authorization is terminated  or revoked sooner.       Influenza A by PCR NEGATIVE NEGATIVE Final   Influenza B by PCR NEGATIVE NEGATIVE Final    Comment: (NOTE) The Xpert Xpress SARS-CoV-2/FLU/RSV plus assay is intended as an aid in the diagnosis of influenza from Nasopharyngeal swab specimens and should not be used as a sole basis for treatment. Nasal washings and aspirates are unacceptable for Xpert Xpress SARS-CoV-2/FLU/RSV testing.  Fact Sheet for Patients: EntrepreneurPulse.com.au  Fact Sheet for Healthcare Providers: IncredibleEmployment.be  This test is not yet approved or cleared by the [place] FDA and has been authorized for detection and/or diagnosis of SARS-CoV-2 by FDA under an Emergency Use Authorization (EUA). This EUA will remain in effect (meaning this test can be used) for the duration of the COVID-19 declaration under Section 564(b)(1) of the Act, 21 U.S.C. section 360bbb-3(b)(1), unless the authorization is terminated or revoked.  Performed at [facility], [address] [place], [place] [address]   Culture, blood (Routine X 2) w Reflex to ID Panel     Status: None (Preliminary result)   Collection Time: 05/14/20  2:41 PM   Specimen: BLOOD  Result Value Ref Range Status   Specimen Description BLOOD RIGHT ANTECUBITAL  Final   Special Requests   Final    BOTTLES DRAWN AEROBIC AND ANAEROBIC Blood Culture adequate volume   Culture   Final    NO GROWTH < 24 HOURS Performed at [facility], [address], [place], [place] [address]    Report Status  PENDING  Incomplete  Culture, blood (Routine X 2) w Reflex to ID Panel     Status: None (Preliminary result)   Collection Time: 05/14/20  2:55 PM   Specimen: BLOOD  Result Value Ref Range Status   Specimen Description BLOOD BLOOD RIGHT HAND  Final   Special Requests   Final    BOTTLES DRAWN AEROBIC AND ANAEROBIC Blood Culture results may not be optimal due to an excessive volume of blood received in culture bottles   Culture   Final    NO GROWTH < 24 HOURS Performed at [facility], [address], [place], [place] [address]    Report Status PENDING  Incomplete  Culture, respiratory (non-expectorated)     Status: None (Preliminary result)   Collection Time: 05/14/20  5:34 PM   Specimen: Tracheal Aspirate; Respiratory  Result Value Ref Range Status   Specimen Description   Final    TRACHEAL ASPIRATE Performed at [facility], [address] [place], [place] [address]    Special Requests   Final    NONE Performed at [facility], [address] [place], [place] [address]    Gram Stain   Final    MODERATE WBC PRESENT, PREDOMINANTLY PMN FEW GRAM POSITIVE COCCI IN CLUSTERS Performed at [facility], [place] [address] [place], [place] [address]    Culture PENDING  Incomplete   Report Status PENDING  Incomplete  MRSA PCR Screening     Status: Abnormal   Collection Time: 05/14/20  9:04 PM   Specimen: Nasopharyngeal  Result Value Ref Range Status   MRSA by PCR POSITIVE (A) NEGATIVE Final    Comment:        The GeneXpert MRSA Assay (FDA approved for NASAL specimens only), is one component of a comprehensive MRSA colonization surveillance program. It is not intended to diagnose MRSA infection nor to guide or monitor treatment for MRSA infections. RESULT CALLED TO, READ BACK BY AND VERIFIED WITH: MELISSA COBB AT [phone] 05/15/20.PMF Performed at [facility], [address] [place], [place] [address]    Studies/Results: DG Chest Port  1 View  Result Date: 05/15/2020 CLINICAL DATA:  Acute respiratory failure EXAM: PORTABLE CHEST 1 VIEW COMPARISON:  05/14/2020 FINDINGS: There is a persistent dense retrocardiac opacity which may represent a combination of atelectasis and a left-sided pleural effusion. There is a left-sided ICD/pacemaker in place. The right-sided central venous catheter is stable in positioning. The enteric tube extends below the left hemidiaphragm. The endotracheal tube terminates above the carina. Perihilar airspace opacities are noted which have worsened since the prior study. IMPRESSION: 1. Lines and tubes as above. 2. Persistent dense retrocardiac opacity. Worsening perihilar airspace opacities. Electronically Signed   By: Constance Holster M.D.   On: 05/15/2020 06:02   DG Chest Port 1 View  Result Date: 05/14/2020 CLINICAL DATA:  Altered mental status EXAM: PORTABLE CHEST 1 VIEW COMPARISON:  05/13/2020 FINDINGS: Endotracheal tube tip is about 3.9 cm superior to carina. Esophageal tube tip below the diaphragm but incompletely visualized. Right IJ central venous catheter tip over the SVC. Left-sided pacing device as before. Cardiomegaly. Worsening consolidation/airspace disease at the left base. No pneumothorax IMPRESSION: Worsening consolidation/airspace disease at the left base. Endotracheal tube tip about 3.9 cm superior to carina. Electronically Signed   By: Donavan Foil M.D.   On: 05/14/2020 18:27   ECHOCARDIOGRAM COMPLETE  Result Date: 05/14/2020    ECHOCARDIOGRAM REPORT   Patient Name:   JOBAN COLLEDGE Date of Exam: 05/14/2020 Medical Rec #:  [id number]    Height:       68.0 in Accession #:    [id number]   Weight:       242.7 lb Date of Birth:  07/11/1945    BSA:          2.219 m Patient Age:    11 years     BP:           124/54 mmHg Patient Gender: M            HR:  62 bpm. Exam Location:  [facility] Procedure: 2D Echo, Cardiac Doppler and Color Doppler Indications:     Bacteremia R78.81  History:          Patient has no prior history of Echocardiogram examinations.                  Defibrillator.  Sonographer:     [name] RDCS (AE) Referring Phys:  [id number] Diagnosing Phys: Lorine Bears MD  Sonographer Comments: Technically difficult study due to poor echo windows, no apical window and echo performed with patient supine and on artificial respirator. IMPRESSIONS  1. Left ventricular ejection fraction, by estimation, is 50 to 55%. The left ventricle has low normal function. Left ventricular endocardial border not optimally defined to evaluate regional wall motion. There is mild left ventricular hypertrophy. Left ventricular diastolic parameters are indeterminate.  2. Right ventricular systolic function is normal. The right ventricular size is normal. Tricuspid regurgitation signal is inadequate for assessing PA pressure.  3. The mitral valve is normal in structure. No evidence of mitral valve regurgitation. No evidence of mitral stenosis.  4. The aortic valve is normal in structure. Aortic valve regurgitation is not visualized. No aortic stenosis is present.  5. Aortic dilatation noted. There is moderate dilatation of the ascending aorta, measuring 45 mm.  6. The inferior vena cava is dilated in size with >50% respiratory variability, suggesting right atrial pressure of 8 mmHg.  7. RV pacemaker lead is thickend. Can not rule out vegetation. Consider a TEE  8. Challenging image quality FINDINGS  Left Ventricle: Left ventricular ejection fraction, by estimation, is 50 to 55%. The left ventricle has low normal function. Left ventricular endocardial border not optimally defined to evaluate regional wall motion. The left ventricular internal cavity  size was normal in size. There is mild left ventricular hypertrophy. Left ventricular diastolic parameters are indeterminate. Right Ventricle: The right ventricular size is normal. No increase in right ventricular wall thickness. Right ventricular systolic  function is normal. Tricuspid regurgitation signal is inadequate for assessing PA pressure. Left Atrium: Left atrial size was normal in size. Right Atrium: Right atrial size was normal in size. Pericardium: There is no evidence of pericardial effusion. Mitral Valve: The mitral valve is normal in structure. No evidence of mitral valve regurgitation. No evidence of mitral valve stenosis. Tricuspid Valve: The tricuspid valve is normal in structure. Tricuspid valve regurgitation is not demonstrated. No evidence of tricuspid stenosis. Aortic Valve: The aortic valve is normal in structure. Aortic valve regurgitation is not visualized. No aortic stenosis is present. Pulmonic Valve: The pulmonic valve was normal in structure. Pulmonic valve regurgitation is not visualized. No evidence of pulmonic stenosis. Aorta: Aortic dilatation noted. There is moderate dilatation of the ascending aorta, measuring 45 mm. Venous: The inferior vena cava is dilated in size with greater than 50% respiratory variability, suggesting right atrial pressure of 8 mmHg. IAS/Shunts: No atrial level shunt detected by color flow Doppler. Additional Comments: A pacer wire is visualized.  LEFT VENTRICLE PLAX 2D LVIDd:         4.20 cm LVIDs:         3.00 cm LV PW:         1.60 cm LV IVS:        1.50 cm LVOT diam:     2.30 cm LVOT Area:     4.15 cm  LEFT ATRIUM         Index LA diam:    4.50  cm 2.03 cm/m                        PULMONIC VALVE AORTA                 PV Vmax:        0.58 m/s Ao Root diam: 4.57 cm PV Peak grad:   1.3 mmHg                       RVOT Peak grad: 2 mmHg   SHUNTS Systemic Diam: 2.30 cm Kathlyn Sacramento MD Electronically signed by Kathlyn Sacramento MD Signature Date/Time: 05/14/2020/4:51:35 PM    Final    Medications: I have reviewed the patient's current medications. Scheduled Meds: . chlorhexidine gluconate (MEDLINE KIT)  15 mL Mouth Rinse BID  . Chlorhexidine Gluconate Cloth  6 each Topical Q0600  . insulin aspart  0-9 Units  Subcutaneous Q4H  . insulin detemir  34 Units Subcutaneous Q12H  . mouth rinse  15 mL Mouth Rinse 10 times per day  . midazolam  2 mg Intravenous Once  . mupirocin ointment  1 application Nasal BID  . pantoprazole (PROTONIX) IV  40 mg Intravenous Q12H   Continuous Infusions: . sodium chloride Stopped (05/13/20 0308)  .  ceFAZolin (ANCEF) IV Stopped (05/15/20 1114)  . fentaNYL infusion INTRAVENOUS 100 mcg/hr (05/15/20 1237)  . metronidazole 500 mg (05/15/20 1318)  . midazolam 1 mg/hr (05/15/20 1237)  . norepinephrine (LEVOPHED) Adult infusion Stopped (05/14/20 2355)  . vasopressin Stopped (05/15/20 1044)   PRN Meds:.acetaminophen, dextrose, docusate sodium, polyethylene glycol, vecuronium   Assessment: Active Problems:   DKA, type 2 (HCC)   Bacteremia   AICD (automatic cardioverter/defibrillator) present   Atrial flutter ([place])    Plan: The patient should have his hemoglobin followed.  If the patient should have any further sign of GI bleeding and a urgent endoscopy is needed then we will do it otherwise with the patient being on pressors and intubated with an elevated troponin I would hold off as long as we can to subject this very sick individual to any endoscopic procedures at the present time.  I have discussed that with Dr. Lanney Gins and he is aware of my plan.   LOS: 3 days   Lewayne Bunting 05/15/2020, 2:46 PM Pager [phone] 7am-5pm  Check AMION for 5pm -7am coverage and on weekends

## 2020-05-15 NOTE — Consult Note (Signed)
Pharmacy Antibiotic Note  Billy Barton is an 75 y.o. male who presented to [facility] on 05/11/2020 with a chief complaint of found down. Patient found to have MSSA bacteremia in 1/2 sets. Pharmacy has been consulted for Cefazolin dosing.  Plan: Continue cefazolin 2 g IV q12h.  Watch renal function closely. Nephrology consulted.  Height: 5\' 8"  (172.7 cm) Weight: 110.1 kg (242 lb 11.6 oz) IBW/kg (Calculated) : 68.4  Temp (24hrs), Avg:101 F (38.3 C), Min:98.6 F (37 C), Max:101.7 F (38.7 C)  Recent Labs  Lab 05/15/2020 2222 05/13/20 0026 05/13/20 0218 05/13/20 0548 05/13/20 1000 05/13/20 1445 05/13/20 1830 05/13/20 2245 05/14/20 0333 05/14/20 0838 05/14/20 2104 05/15/20 0558  WBC 26.2* 21.4* 29.4* 27.2* 31.2* 30.4* 25.6* 22.4*  --  18.6* 13.6* 14.6*  CREATININE 4.34* 4.15* 3.83* 3.50* 3.86* 4.04* 4.26*  --  4.46*  --   --  4.69*  LATICACIDVEN 5.8* 2.8* 3.4* 2.0*  --   --   --   --   --   --   --   --     Estimated Creatinine Clearance: 16.6 mL/min (A) (by C-G formula based on SCr of 4.69 mg/dL (H)).    Not on File  Antimicrobials this admission: Cefepime >> 1/12 x1 Metronidazole >> 1/13 x1 Zosyn>> 1/13 x 1 Vancomycin >> 1500 mg x1 Cefazolin 1/13 >>  Microbiology results: 1/12 BCx: MSSA 1/2 sets  Thank you for allowing pharmacy to be a part of this patient's care.  3/12, PharmD Clinical Pharmacist  05/15/2020 2:12 PM

## 2020-05-16 LAB — COMPREHENSIVE METABOLIC PANEL
ALT: <5 U/L (ref 0–44)
AST: 16 U/L (ref 15–41)
Albumin: 1.8 g/dL — ABNORMAL LOW (ref 3.5–5.0)
Alkaline Phosphatase: 60 U/L (ref 38–126)
Anion gap: 10 (ref 5–15)
BUN: 99 mg/dL — ABNORMAL HIGH (ref 8–23)
CO2: 25 mmol/L (ref 22–32)
Calcium: 8.2 mg/dL — ABNORMAL LOW (ref 8.9–10.3)
Chloride: 119 mmol/L — ABNORMAL HIGH (ref 98–111)
Creatinine, Ser: 4.89 mg/dL — ABNORMAL HIGH (ref 0.61–1.24)
GFR, Estimated: 12 mL/min — ABNORMAL LOW (ref >60)
Glucose, Bld: 103 mg/dL — ABNORMAL HIGH (ref 70–99)
Potassium: 3.4 mmol/L — ABNORMAL LOW (ref 3.5–5.1)
Sodium: 154 mmol/L — ABNORMAL HIGH (ref 135–145)
Total Bilirubin: 0.5 mg/dL (ref 0.3–1.2)
Total Protein: 5.2 g/dL — ABNORMAL LOW (ref 6.5–8.1)

## 2020-05-16 LAB — GLUCOSE, CAPILLARY
Glucose-Capillary: 106 mg/dL — ABNORMAL HIGH (ref 70–99)
Glucose-Capillary: 86 mg/dL (ref 70–99)
Glucose-Capillary: 120 mg/dL — ABNORMAL HIGH (ref 70–99)
Glucose-Capillary: 69 mg/dL — ABNORMAL LOW (ref 70–99)
Glucose-Capillary: 105 mg/dL — ABNORMAL HIGH (ref 70–99)
Glucose-Capillary: 114 mg/dL — ABNORMAL HIGH (ref 70–99)

## 2020-05-16 LAB — CBC
HCT: 28.5 % — ABNORMAL LOW (ref 39.0–52.0)
Hemoglobin: 9.6 g/dL — ABNORMAL LOW (ref 13.0–17.0)
MCH: 30.8 pg (ref 26.0–34.0)
MCHC: 33.7 g/dL (ref 30.0–36.0)
MCV: 91.3 fL (ref 80.0–100.0)
Platelets: 110 10*3/uL — ABNORMAL LOW (ref 150–400)
RBC: 3.12 MIL/uL — ABNORMAL LOW (ref 4.22–5.81)
RDW: 20.7 % — ABNORMAL HIGH (ref 11.5–15.5)
WBC: 15.6 10*3/uL — ABNORMAL HIGH (ref 4.0–10.5)
nRBC: 0.2 % (ref 0.0–0.2)

## 2020-05-16 LAB — CULTURE, BLOOD (SINGLE): Special Requests: ADEQUATE

## 2020-05-16 LAB — TRIGLYCERIDES: Triglycerides: 119 mg/dL (ref <150)

## 2020-05-16 LAB — PHOSPHORUS: Phosphorus: 2.3 mg/dL — ABNORMAL LOW (ref 2.5–4.6)

## 2020-05-16 LAB — MAGNESIUM: Magnesium: 1.9 mg/dL (ref 1.7–2.4)

## 2020-05-16 MED ORDER — SODIUM CHLORIDE 0.9 % IV SOLN
INTRAVENOUS | Status: DC | PRN
  Administered 2020-05-16 – 2020-05-17 (×2): 250 mL via INTRAVENOUS

## 2020-05-16 MED ORDER — POTASSIUM PHOSPHATES 15 MMOLE/5ML IV SOLN
10.0000 mmol | Freq: Once | INTRAVENOUS | Status: AC
  Administered 2020-05-16: 10 mmol via INTRAVENOUS
  Filled 2020-05-16: qty 3.33

## 2020-05-16 NOTE — Progress Notes (Signed)
PHARMACY CONSULT NOTE - FOLLOW UP  Pharmacy Consult for Electrolyte Monitoring and Replacement   Recent Labs: Potassium (mmol/L)  Date Value  05/16/2020 3.4 (L)   Magnesium (mg/dL)  Date Value  61/22/4497 1.9   Calcium (mg/dL)  Date Value  53/00/5110 8.2 (L)   Albumin (g/dL)  Date Value  21/03/7355 1.8 (L)   Phosphorus (mg/dL)  Date Value  70/14/1030 2.3 (L)   Sodium (mmol/L)  Date Value  05/16/2020 154 (H)   Assessment: 75 year old male admitted with metabolic acidosis s/t DKA vs HHS. Code Blue called after arrival to ICU and patient intubated. Blood cultures positive for MSSA. Patient remains on insulin and bicarb drips. Pharmacy to manage electrolytes.  1/13 2245 Phos 2.5, Mg 1.8 (WNL)  Goal of Therapy:  Electrolytes WNL, K ~ 4  Plan:  K 3.4   Mag 1.9 Phos 2.3  Scr 4.89 -will order K Phos 10 mmol IV x 1 (conservative given renal fxn) Will follow up with morning labs.  Angelique Blonder ,PharmD Clinical Pharmacist 05/16/2020 10:43 AM

## 2020-05-16 NOTE — Progress Notes (Signed)
Progress Note  Patient Name: Billy Barton Date of Encounter: 05/16/2020  [facility] Cardiologist: [facility]  Subjective   Intubated, sedated.  Spontaneously converted to sinus rhythm earlier this morning.  Inpatient Medications    Scheduled Meds: . chlorhexidine gluconate (MEDLINE KIT)  15 mL Mouth Rinse BID  . Chlorhexidine Gluconate Cloth  6 each Topical Q0600  . insulin aspart  0-9 Units Subcutaneous Q4H  . insulin detemir  34 Units Subcutaneous Q12H  . mouth rinse  15 mL Mouth Rinse 10 times per day  . midazolam  2 mg Intravenous Once  . mupirocin ointment  1 application Nasal BID  . pantoprazole (PROTONIX) IV  40 mg Intravenous Q12H   Continuous Infusions: . sodium chloride Stopped (05/13/20 0308)  . sodium chloride Stopped (05/16/20 0603)  .  ceFAZolin (ANCEF) IV 2 g (05/16/20 0747)  . fentaNYL infusion INTRAVENOUS 75 mcg/hr (05/16/20 0748)  . metronidazole 100 mL/hr at 05/16/20 0700  . midazolam 1 mg/hr (05/16/20 0700)  . norepinephrine (LEVOPHED) Adult infusion Stopped (05/14/20 2355)  . vasopressin Stopped (05/15/20 1044)   PRN Meds: sodium chloride, acetaminophen, dextrose, docusate sodium, polyethylene glycol, vecuronium   Vital Signs    Vitals:   05/16/20 0530 05/16/20 0600 05/16/20 0630 05/16/20 0700  BP: 128/60 129/60 130/61 (!) 144/60  Pulse: 84 85 84 85  Resp: $Remo'15 16 15 18  'euZlc$ Temp: (!) 97.52 F (36.4 C) (!) 97.52 F (36.4 C) (!) 97.52 F (36.4 C) (!) 97.52 F (36.4 C)  TempSrc:      SpO2: 95% 90% 93% 96%  Weight:      Height:        Intake/Output Summary (Last 24 hours) at 05/16/2020 0833 Last data filed at 05/16/2020 0700 Gross per 24 hour  Intake 798.07 ml  Output 1455 ml  Net -656.93 ml   Last 3 Weights 05/16/2020 05/14/2020 05/17/2020  Weight (lbs) 241 lb 10 oz 242 lb 11.6 oz 280 lb  Weight (kg) 109.6 kg 110.1 kg 127.007 kg      Telemetry    Sinus rhythm- Personally Reviewed  ECG    No new tracing obtained- Personally  Reviewed  Physical Exam   GEN: Intubated, sedated  HEENT: Normal  NECK: No JVD; No carotid bruits  CARDIAC: RRR, heart sounds appear distant  RESPIRATORY: Vented breath sounds  ABDOMEN: Soft, non-tender, non-distended  MUSCULOSKELETAL:  No edema; No deformity   SKIN: Warm and dry  NEUROLOGIC: Unable to assess  PSYCHIATRIC: Unable to assess    Labs    High Sensitivity Troponin:   Recent Labs  Lab 05/03/2020 2222 05/13/20 0026 05/13/20 0218 05/13/20 0548  TROPONINIHS 50* 47* 52* 67*      Chemistry Recent Labs  Lab 05/14/20 0333 05/15/20 0558 05/16/20 0551  NA 147* 151* 154*  K 3.9 3.9 3.4*  CL 111 117* 119*  CO2 $Re'22 24 25  'nWC$ GLUCOSE 289* 198* 103*  BUN 119* 112* 99*  CREATININE 4.46* 4.69* 4.89*  CALCIUM 8.0* 8.3* 8.2*  PROT 4.8* 4.8* 5.2*  ALBUMIN 2.1* 1.9* 1.8*  AST $Re'21 20 16  'CMT$ ALT 10 6 <5  ALKPHOS 46 48 60  BILITOT 0.9 0.5 0.5  GFRNONAA 13* 12* 12*  ANIONGAP $RemoveB'14 10 10     'AywxfjPW$ Hematology Recent Labs  Lab 05/14/20 2104 05/15/20 0558 05/16/20 0551  WBC 13.6* 14.6* 15.6*  RBC 3.03* 3.07* 3.12*  HGB 9.3* 9.4* 9.6*  HCT 26.9* 27.4* 28.5*  MCV 88.8 89.3 91.3  MCH 30.7 30.6 30.8  MCHC  34.6 34.3 33.7  RDW 18.2* 19.4* 20.7*  PLT 108* 104* 110*    BNPNo results for input(s): BNP, PROBNP in the last 168 hours.   DDimer No results for input(s): DDIMER in the last 168 hours.   Radiology    DG Chest Port 1 View  Result Date: 05/15/2020 CLINICAL DATA:  Acute respiratory failure EXAM: PORTABLE CHEST 1 VIEW COMPARISON:  05/14/2020 FINDINGS: There is a persistent dense retrocardiac opacity which may represent a combination of atelectasis and a left-sided pleural effusion. There is a left-sided ICD/pacemaker in place. The right-sided central venous catheter is stable in positioning. The enteric tube extends below the left hemidiaphragm. The endotracheal tube terminates above the carina. Perihilar airspace opacities are noted which have worsened since the prior study.  IMPRESSION: 1. Lines and tubes as above. 2. Persistent dense retrocardiac opacity. Worsening perihilar airspace opacities. Electronically Signed   By: Constance Holster M.D.   On: 05/15/2020 06:02   DG Chest Port 1 View  Result Date: 05/14/2020 CLINICAL DATA:  Altered mental status EXAM: PORTABLE CHEST 1 VIEW COMPARISON:  05/13/2020 FINDINGS: Endotracheal tube tip is about 3.9 cm superior to carina. Esophageal tube tip below the diaphragm but incompletely visualized. Right IJ central venous catheter tip over the SVC. Left-sided pacing device as before. Cardiomegaly. Worsening consolidation/airspace disease at the left base. No pneumothorax IMPRESSION: Worsening consolidation/airspace disease at the left base. Endotracheal tube tip about 3.9 cm superior to carina. Electronically Signed   By: Donavan Foil M.D.   On: 05/14/2020 18:27   ECHOCARDIOGRAM COMPLETE  Result Date: 05/14/2020    ECHOCARDIOGRAM REPORT   Patient Name:   Billy Barton Date of Exam: 05/14/2020 Medical Rec #:  [id number]    Height:       68.0 in Accession #:    [id number]   Weight:       242.7 lb Date of Birth:  Sep 21, 1945    BSA:          2.219 m Patient Age:    75 years     BP:           124/54 mmHg Patient Gender: M            HR:           62 bpm. Exam Location:  [facility] Procedure: 2D Echo, Cardiac Doppler and Color Doppler Indications:     Bacteremia R78.81  History:         Patient has no prior history of Echocardiogram examinations.                  Defibrillator.  Sonographer:     Sherrie Sport RDCS (AE) Referring Phys:  [place] Diagnosing Phys: Kathlyn Sacramento MD  Sonographer Comments: Technically difficult study due to poor echo windows, no apical window and echo performed with patient supine and on artificial respirator. IMPRESSIONS  1. Left ventricular ejection fraction, by estimation, is 50 to 55%. The left ventricle has low normal function. Left ventricular endocardial border not optimally defined to evaluate regional  wall motion. There is mild left ventricular hypertrophy. Left ventricular diastolic parameters are indeterminate.  2. Right ventricular systolic function is normal. The right ventricular size is normal. Tricuspid regurgitation signal is inadequate for assessing PA pressure.  3. The mitral valve is normal in structure. No evidence of mitral valve regurgitation. No evidence of mitral stenosis.  4. The aortic valve is normal in structure. Aortic valve regurgitation is not visualized. No aortic stenosis  is present.  5. Aortic dilatation noted. There is moderate dilatation of the ascending aorta, measuring 45 mm.  6. The inferior vena cava is dilated in size with >50% respiratory variability, suggesting right atrial pressure of 8 mmHg.  7. RV pacemaker lead is thickend. Can not rule out vegetation. Consider a TEE  8. Challenging image quality FINDINGS  Left Ventricle: Left ventricular ejection fraction, by estimation, is 50 to 55%. The left ventricle has low normal function. Left ventricular endocardial border not optimally defined to evaluate regional wall motion. The left ventricular internal cavity  size was normal in size. There is mild left ventricular hypertrophy. Left ventricular diastolic parameters are indeterminate. Right Ventricle: The right ventricular size is normal. No increase in right ventricular wall thickness. Right ventricular systolic function is normal. Tricuspid regurgitation signal is inadequate for assessing PA pressure. Left Atrium: Left atrial size was normal in size. Right Atrium: Right atrial size was normal in size. Pericardium: There is no evidence of pericardial effusion. Mitral Valve: The mitral valve is normal in structure. No evidence of mitral valve regurgitation. No evidence of mitral valve stenosis. Tricuspid Valve: The tricuspid valve is normal in structure. Tricuspid valve regurgitation is not demonstrated. No evidence of tricuspid stenosis. Aortic Valve: The aortic valve is normal  in structure. Aortic valve regurgitation is not visualized. No aortic stenosis is present. Pulmonic Valve: The pulmonic valve was normal in structure. Pulmonic valve regurgitation is not visualized. No evidence of pulmonic stenosis. Aorta: Aortic dilatation noted. There is moderate dilatation of the ascending aorta, measuring 45 mm. Venous: The inferior vena cava is dilated in size with greater than 50% respiratory variability, suggesting right atrial pressure of 8 mmHg. IAS/Shunts: No atrial level shunt detected by color flow Doppler. Additional Comments: A pacer wire is visualized.  LEFT VENTRICLE PLAX 2D LVIDd:         4.20 cm LVIDs:         3.00 cm LV PW:         1.60 cm LV IVS:        1.50 cm LVOT diam:     2.30 cm LVOT Area:     4.15 cm  LEFT ATRIUM         Index LA diam:    4.50 cm 2.03 cm/m                        PULMONIC VALVE AORTA                 PV Vmax:        0.58 m/s Ao Root diam: 4.57 cm PV Peak grad:   1.3 mmHg                       RVOT Peak grad: 2 mmHg   SHUNTS Systemic Diam: 2.30 cm Kathlyn Sacramento MD Electronically signed by Kathlyn Sacramento MD Signature Date/Time: 05/14/2020/4:51:35 PM    Final     Cardiac Studies   05/2020 1. Left ventricular ejection fraction, by estimation, is 50 to 55%. The  left ventricle has low normal function. Left ventricular endocardial  border not optimally defined to evaluate regional wall motion. There is  mild left ventricular hypertrophy. Left  ventricular diastolic parameters are indeterminate.  2. Right ventricular systolic function is normal. The right ventricular  size is normal. Tricuspid regurgitation signal is inadequate for assessing  PA pressure.  3. The mitral valve is normal in structure. No  evidence of mitral valve  regurgitation. No evidence of mitral stenosis.  4. The aortic valve is normal in structure. Aortic valve regurgitation is  not visualized. No aortic stenosis is present.  5. Aortic dilatation noted. There is moderate  dilatation of the ascending  aorta, measuring 45 mm.  6. The inferior vena cava is dilated in size with >50% respiratory  variability, suggesting right atrial pressure of 8 mmHg.  7. RV pacemaker lead is thickend. Can not rule out vegetation. Consider a  TEE  8. Challenging image quality   Patient Profile     75 y.o. male with history of AICD placement, CAD/MI, diabetes presenting with altered mental status, diagnosed with septic shock and MSSA bacteremia.  Intubated for respiratory failure, hospital course complicated by development of atrial flutter.  Assessment & Plan    1.  Atrial flutter  -Converted to sinus rhythm earlier this a.m. -Not on anticoagulation due to anemia  -Echo with preserved EF   2. MSSA bacteremia, AICD  -Plan for TEE to rule out vegetation tomorrow  -Antibiotics as per primary team    3. AICD-[employer] device  -Unclear reasoning for implantation -Interrogation pending   4. Anemia  -Status post packed red blood cells  -H&H stable since transfusion  5.  Sepsis, respiratory failure -Management as per ICU team    Greater than 50% was spent in counseling and coordination of care with patient  Total encounter time 35 minutes or more          Signed, Kate Sable, MD  05/16/2020, 8:33 AM

## 2020-05-16 NOTE — Progress Notes (Signed)
NAME:  Billy Barton, MRN:  [id number], DOB:  10-Mar-1946, LOS: 4 ADMISSION DATE:  2020/06/04, CONSULTATION DATE:  June 04, 2020 REFERRING MD:  ED , CHIEF COMPLAINT:  Unresponsive   Brief History:  75yo male w/unknown past medical history who presented to the ED w/AMS and was found to be in DKA vs HSS with a blood sugar >600 and lactate 5.8  History of Present Illness:  75yo male w/unknown past medical history who presented to the ED via EMS after the son found him unresponsive, face down in the kitchen, incontinent of urine. Per the ED, the patient son reported to them they call and check on him daily and last spoke with him yesterday. When EMS arrived, the patient was awake but became unresponsive en route to the ED.   On arrival to the ED, the patient was arousable only to painful stimuli, hypothermic 94 otherwise stable vital signs. Labs revealed a blood glucose > 600, lactate 5.8, WBC 26.2, Hgb 7.1 and VBG: 7.19/36 and bicarb 13. He was given 1L LR bolus, started on endotool and antibiotics were initiated. Rapid COVID test negative  ICU was called for admission.   05/14/20 - patient with MSSA bacteremia, critially ill on mechanical ventilator.  Remains on vasopressor. Insulin gtt is off today.  Active GI bleed s/p PRBC transfusion. 05/15/20- patient remains hypotensive on MV.  Possible EGD when more stable - spoke with GI- Dr Lacretia Nicks today. Still with edema 2+ on vasopressors.  05/16/20- patient on MV.  A line not functioning will dc today.  Pt in sinus rhythm remains intubated septic awaiting tee for endocarditis workup.   Past Medical History:  Unknown at this time, will need to speak with family when they arrive  Significant Hospital Events:  1/13-Admitted to ICU  Consults:  None  Procedures:  None   Significant Diagnostic Tests:  Rapid COVID negative  Micro Data:  Blood cultures sent 1/13: Pending  Antimicrobials:  Cefepime>>1/13 Flagyl>>1.13 Vancomycin >>1/13  Interim History /  Subjective:  Arousable to voice and able to say name Confused Hypothermic w/bear hugger on VSS  Objective   Blood pressure (!) 144/60, pulse 85, temperature (!) 97.52 F (36.4 C), resp. rate 18, height 5\' 8"  (1.727 m), weight 109.6 kg, SpO2 96 %.    Vent Mode: PRVC FiO2 (%):  [28 %-50 %] 28 % Set Rate:  [16 bmp] 16 bmp Vt Set:  [500 mL] 500 mL PEEP:  [5 cmH20] 5 cmH20 Plateau Pressure:  [14 cmH20-15 cmH20] 15 cmH20   Intake/Output Summary (Last 24 hours) at 05/16/2020 0940 Last data filed at 05/16/2020 0700 Gross per 24 hour  Intake 756.83 ml  Output 1425 ml  Net -668.17 ml   Filed Weights   06/04/2020 2206 05/14/20 0342 05/16/20 0500  Weight: 127 kg 110.1 kg 109.6 kg    Examination: General:age appropriate HENT: dry oral mucosa, normocephalic Lungs: Lungs with rhonchi and MV sounds Cardiovascular: Heart sounds S1S2, +1 pitting edema Abdomen: soft, non-distended Extremities: warm, palpable pulses Neuro: intubated on sedation with GCS4T GU: No urine output yet. Foley to be placed  Resolved Hospital Problem list     Assessment & Plan:  Diabetic Ketoacidosis           Due to sepsis bacteremia --Blood glucose 1338 via BMP --UA pending --Received 1L LR in ED --Additional 1L LR given --LR at 150cc, add dextrose once BS <250 --Endotool --BMP, Lactates Q4hr --Monitor lytes    -Levemir, ISS -    Metabolic acidosis --2/2  above --Trend ABG/Lactates --Fluids given --Will hold off on Bicarb administration for now  Hyperkalemia- improved --K 6.7 --Receiving IV insulin at 15units/hr via endotool --Received IVFs --Trending BMP q4hr --No EKG changes at this time  Acute Kidney Injury --in the setting of HSS --Baseline Cr unknown --Cr 4.34 on admission --Continue aggressive fluid replacement --Avoid nephrotoxins --Monitor I&Os, place foley         - nephrology on case - appreciate input   Acute blood loss anemia --hgb 7.1 on arrival --Per ED, the did a stool  guiac which was positive --ED ordered 2units of PRBC and started protonix gtt --Serial CBCs --Monitor for now, if starts to have active bleeding will need to consult GI 05/14/20- s/p PRBc transfusion - 2 more units today          GI consult.    Septic shock    Present on admission - due to staph aureus bacteremia    -ID on case - appreciate input     -s/p 1L LR yesterday - will deliver more IVF today    S/p 1 unit of prbc  Best practice (evaluated daily)  Diet: NPO Pain/Anxiety/Delirium protocol (if indicated): N/A VAP protocol (if indicated): N/A DVT prophylaxis: SCDs only, hold chemical prophylaxis given Acute blood loss anemia GI prophylaxis: Protonix gtt Glucose control: Endotool Mobility: PT consult in AM Disposition:ICU  Goals of Care:  Last date of multidisciplinary goals of care discussion:N/A Family and staff present: N/A Summary of discussion: N/A Follow up goals of care discussion due: N/A Code Status: Full  Labs   CBC: Recent Labs  Lab 05/27/2020 2222 05/13/20 0026 05/13/20 2245 05/14/20 0838 05/14/20 2104 05/15/20 0558 05/16/20 0551  WBC 26.2*   < > 22.4* 18.6* 13.6* 14.6* 15.6*  NEUTROABS 21.6*  --   --   --  10.8*  --   --   HGB 7.1*   < > 7.3* 7.5* 9.3* 9.4* 9.6*  HCT 22.0*   < > 21.4* 21.5* 26.9* 27.4* 28.5*  MCV 99.5   < > 90.3 89.6 88.8 89.3 91.3  PLT 227   < > 149* 134* 108* 104* 110*   < > = values in this interval not displayed.    Basic Metabolic Panel: Recent Labs  Lab 05/13/20 1445 05/13/20 1830 05/13/20 2245 05/14/20 0333 05/15/20 0558 05/16/20 0551  NA 148* 147*  --  147* 151* 154*  K 3.0* 3.3*  --  3.9 3.9 3.4*  CL 112* 111  --  111 117* 119*  CO2 20* 22  --  22 24 25   GLUCOSE 195* 192*  --  289* 198* 103*  BUN 138* 130*  --  119* 112* 99*  CREATININE 4.04* 4.26*  --  4.46* 4.69* 4.89*  CALCIUM 9.0 8.6*  --  8.0* 8.3* 8.2*  MG  --   --  1.8 1.9 1.8 1.9  PHOS  --   --  2.5 3.0 2.8 2.3*   GFR: Estimated Creatinine  Clearance: 15.9 mL/min (A) (by C-G formula based on SCr of 4.89 mg/dL (H)). Recent Labs  Lab 05/30/2020 2222 05/13/20 0026 05/13/20 0218 05/13/20 0548 05/13/20 1000 05/14/20 0838 05/14/20 2104 05/15/20 0558 05/16/20 0551  WBC 26.2* 21.4* 29.4* 27.2*   < > 18.6* 13.6* 14.6* 15.6*  LATICACIDVEN 5.8* 2.8* 3.4* 2.0*  --   --   --   --   --    < > = values in this interval not displayed.    Liver Function Tests:  Recent Labs  Lab 05/14/2020 2222 05/14/20 0333 05/15/20 0558 05/16/20 0551  AST 24 21 20 16   ALT 11 10 6  <5  ALKPHOS 68 46 48 60  BILITOT 0.9 0.9 0.5 0.5  PROT 5.2* 4.8* 4.8* 5.2*  ALBUMIN 2.5* 2.1* 1.9* 1.8*   No results for input(s): LIPASE, AMYLASE in the last 168 hours. No results for input(s): AMMONIA in the last 168 hours.  ABG    Component Value Date/Time   PHART 7.40 05/14/2020 0333   PCO2ART 32 05/14/2020 0333   PO2ART 87 05/14/2020 0333   HCO3 19.8 (L) 05/14/2020 0333   ACIDBASEDEF 4.6 (H) 05/14/2020 0333   O2SAT 96.6 05/14/2020 0333     Coagulation Profile: Recent Labs  Lab 05/21/2020 2222 05/13/20 0218  INR 1.4* 1.5*    Cardiac Enzymes: Recent Labs  Lab 05/19/2020 2222  CKTOTAL 149    HbA1C: Hgb A1c MFr Bld  Date/Time Value Ref Range Status  05/13/2020 02:49 PM 10.2 (H) 4.8 - 5.6 % Final    Comment:    (NOTE) Pre diabetes:          5.7%-6.4%  Diabetes:              >6.4%  Glycemic control for   <7.0% adults with diabetes     CBG: Recent Labs  Lab 05/15/20 1943 05/15/20 2000 05/15/20 2349 05/16/20 0415 05/16/20 0712  GLUCAP 175* 171* 153* 106* 86    Review of Systems:   Unable to assess due to clinical condition  Past Medical History:  He,  has a past medical history of AICD (automatic cardioverter/defibrillator) present, CAD (coronary artery disease), and Insulin dependent type 2 diabetes mellitus (HCC).   Surgical History:  Unknown   Social History:   reports previous alcohol use.   Family History:  His family  history is not on file.   Allergies Not on File   Home Medications  Prior to Admission medications   Not on File     Critical care time: 43     Critical care provider statement:    Critical care time (minutes):  33   Critical care time was exclusive of:  Separately billable procedures and  treating other patients   Critical care was necessary to treat or prevent imminent or  life-threatening deterioration of the following conditions:  GI bleed , septic shock, bacteremia, acute hypoxemic respiratory failure.    Critical care was time spent personally by me on the following  activities:  Development of treatment plan with patient or surrogate,  discussions with consultants, evaluation of patient's response to  treatment, examination of patient, obtaining history from patient or  surrogate, ordering and performing treatments and interventions, ordering  and review of laboratory studies and re-evaluation of patient's condition   I assumed direction of critical care for this patient from another  provider in my specialty: no      05/18/20, M.D.  Pulmonary & Critical Care Medicine  [facility]

## 2020-05-16 NOTE — Consult Note (Signed)
Pharmacy Antibiotic Note  Billy Barton is an 75 y.o. male who presented to [facility] on 05/06/2020 with a chief complaint of found down. Patient found to have MSSA bacteremia in 1/2 sets. Pharmacy has been consulted for Cefazolin dosing.  Plan: Continue cefazolin 2 g IV q12h for Crcl 10-29 ml/min.  Scr 4.89  Crcl 15.9 ml/min Watch renal function closely. Nephrology consulted.  1/16:MSSA bacteremia, AICD -Plan for TEE to rule out vegetation/endocarditis tomorrow 1/17    Height: 5\' 8"  (172.7 cm) Weight: 109.6 kg (241 lb 10 oz) IBW/kg (Calculated) : 68.4  Temp (24hrs), Avg:98.2 F (36.8 C), Min:97.34 F (36.3 C), Max:98.78 F (37.1 C)  Recent Labs  Lab May 17, 2020 2222 05/13/20 0026 05/13/20 0218 05/13/20 0548 05/13/20 1000 05/13/20 1445 05/13/20 1830 05/13/20 2245 05/14/20 0333 05/14/20 0838 05/14/20 2104 05/15/20 0558 05/16/20 0551  WBC 26.2* 21.4* 29.4* 27.2*   < > 30.4* 25.6* 22.4*  --  18.6* 13.6* 14.6* 15.6*  CREATININE 4.34* 4.15* 3.83* 3.50*   < > 4.04* 4.26*  --  4.46*  --   --  4.69* 4.89*  LATICACIDVEN 5.8* 2.8* 3.4* 2.0*  --   --   --   --   --   --   --   --   --    < > = values in this interval not displayed.    Estimated Creatinine Clearance: 15.9 mL/min (A) (by C-G formula based on SCr of 4.89 mg/dL (H)).    Not on File  Antimicrobials this admission: Cefepime >> 1/12 x1 Metronidazole >> 1/13 x1 Zosyn>> 1/13 x 1 Vancomycin >> 1500 mg x1 Cefazolin 1/13 >>  Microbiology results: 1/12 BCx: MSSA 1/2 sets  Thank you for allowing pharmacy to be a part of this patient's care.  3/12, PharmD Clinical Pharmacist  05/16/2020 10:52 AM

## 2020-05-17 ENCOUNTER — Inpatient Hospital Stay: Payer: No Typology Code available for payment source

## 2020-05-17 ENCOUNTER — Encounter: Admission: EM | Disposition: E | Payer: Self-pay | Source: Home / Self Care | Attending: Internal Medicine

## 2020-05-17 ENCOUNTER — Inpatient Hospital Stay (HOSPITAL_COMMUNITY)
Admit: 2020-05-17 | Discharge: 2020-05-17 | Disposition: A | Discharge status: Home / Self Care | Payer: No Typology Code available for payment source | Attending: Nurse Practitioner | Admitting: Nurse Practitioner

## 2020-05-17 DIAGNOSIS — I361 Nonrheumatic tricuspid (valve) insufficiency: Secondary | ICD-10-CM

## 2020-05-17 DIAGNOSIS — J9601 Acute respiratory failure with hypoxia: Secondary | ICD-10-CM

## 2020-05-17 DIAGNOSIS — I340 Nonrheumatic mitral (valve) insufficiency: Secondary | ICD-10-CM

## 2020-05-17 DIAGNOSIS — R195 Other fecal abnormalities: Secondary | ICD-10-CM

## 2020-05-17 DIAGNOSIS — E1110 Type 2 diabetes mellitus with ketoacidosis without coma: Secondary | ICD-10-CM

## 2020-05-17 DIAGNOSIS — R7881 Bacteremia: Secondary | ICD-10-CM

## 2020-05-17 LAB — COMPREHENSIVE METABOLIC PANEL
ALT: <5 U/L (ref 0–44)
AST: 15 U/L (ref 15–41)
Albumin: 1.7 g/dL — ABNORMAL LOW (ref 3.5–5.0)
Alkaline Phosphatase: 58 U/L (ref 38–126)
Anion gap: 11 (ref 5–15)
BUN: 96 mg/dL — ABNORMAL HIGH (ref 8–23)
CO2: 25 mmol/L (ref 22–32)
Calcium: 7.7 mg/dL — ABNORMAL LOW (ref 8.9–10.3)
Chloride: 120 mmol/L — ABNORMAL HIGH (ref 98–111)
Creatinine, Ser: 5.04 mg/dL — ABNORMAL HIGH (ref 0.61–1.24)
GFR, Estimated: 11 mL/min — ABNORMAL LOW (ref >60)
Glucose, Bld: 178 mg/dL — ABNORMAL HIGH (ref 70–99)
Potassium: 3.6 mmol/L (ref 3.5–5.1)
Sodium: 156 mmol/L — ABNORMAL HIGH (ref 135–145)
Total Bilirubin: 0.6 mg/dL (ref 0.3–1.2)
Total Protein: 5.1 g/dL — ABNORMAL LOW (ref 6.5–8.1)

## 2020-05-17 LAB — CBC
HCT: 26.6 % — ABNORMAL LOW (ref 39.0–52.0)
Hemoglobin: 8.4 g/dL — ABNORMAL LOW (ref 13.0–17.0)
MCH: 29.7 pg (ref 26.0–34.0)
MCHC: 31.6 g/dL (ref 30.0–36.0)
MCV: 94 fL (ref 80.0–100.0)
Platelets: 123 10*3/uL — ABNORMAL LOW (ref 150–400)
RBC: 2.83 MIL/uL — ABNORMAL LOW (ref 4.22–5.81)
RDW: 20.5 % — ABNORMAL HIGH (ref 11.5–15.5)
WBC: 12.8 10*3/uL — ABNORMAL HIGH (ref 4.0–10.5)
nRBC: 0.4 % — ABNORMAL HIGH (ref 0.0–0.2)

## 2020-05-17 LAB — GLUCOSE, CAPILLARY
Glucose-Capillary: 151 mg/dL — ABNORMAL HIGH (ref 70–99)
Glucose-Capillary: 156 mg/dL — ABNORMAL HIGH (ref 70–99)
Glucose-Capillary: 169 mg/dL — ABNORMAL HIGH (ref 70–99)
Glucose-Capillary: 219 mg/dL — ABNORMAL HIGH (ref 70–99)
Glucose-Capillary: 250 mg/dL — ABNORMAL HIGH (ref 70–99)
Glucose-Capillary: 296 mg/dL — ABNORMAL HIGH (ref 70–99)
Glucose-Capillary: 324 mg/dL — ABNORMAL HIGH (ref 70–99)

## 2020-05-17 LAB — CULTURE, BLOOD (SINGLE)
Culture: NO GROWTH
Special Requests: ADEQUATE

## 2020-05-17 LAB — CULTURE, RESPIRATORY W GRAM STAIN

## 2020-05-17 LAB — MAGNESIUM: Magnesium: 1.9 mg/dL (ref 1.7–2.4)

## 2020-05-17 LAB — PHOSPHORUS
Phosphorus: 3.3 mg/dL (ref 2.5–4.6)
Phosphorus: 3.1 mg/dL (ref 2.5–4.6)

## 2020-05-17 LAB — SODIUM, URINE, RANDOM: Sodium, Ur: 25 mmol/L

## 2020-05-17 SURGERY — ECHOCARDIOGRAM, TRANSESOPHAGEAL
Anesthesia: Moderate Sedation

## 2020-05-17 MED ORDER — DEXTROSE 5 % IV SOLN: INTRAVENOUS | Status: DC

## 2020-05-17 MED ORDER — MAGNESIUM SULFATE 2 GM/50ML IV SOLN
2.0000 g | Freq: Once | INTRAVENOUS | Status: AC
  Administered 2020-05-17: 2 g via INTRAVENOUS
  Filled 2020-05-17: qty 50

## 2020-05-17 MED ORDER — ADULT MULTIVITAMIN W/MINERALS CH
1.0000 | ORAL_TABLET | Freq: Every day | ORAL | Status: DC
  Administered 2020-05-18 – 2020-05-26 (×9): 1
  Filled 2020-05-17 (×10): qty 1

## 2020-05-17 MED ORDER — POTASSIUM CHLORIDE 10 MEQ/50ML IV SOLN
10.0000 meq | INTRAVENOUS | Status: AC
  Administered 2020-05-17 (×4): 10 meq via INTRAVENOUS
  Filled 2020-05-17 (×4): qty 50

## 2020-05-17 MED ORDER — VITAL HIGH PROTEIN PO LIQD: 1000.0000 mL | ORAL | Status: DC

## 2020-05-17 MED ORDER — DEXMEDETOMIDINE HCL IN NACL 400 MCG/100ML IV SOLN
0.2000 ug/kg/h | INTRAVENOUS | Status: DC
  Administered 2020-05-22: 03:00:00 0.4 ug/kg/h via INTRAVENOUS
  Administered 2020-05-24: 22:00:00 1.2 ug/kg/h via INTRAVENOUS
  Administered 2020-05-24: 0.4 ug/kg/h via INTRAVENOUS
  Administered 2020-05-24: 15:00:00 0.7 ug/kg/h via INTRAVENOUS
  Administered 2020-05-24: 19:00:00 0.8 ug/kg/h via INTRAVENOUS
  Administered 2020-05-24: 0.4 ug/kg/h via INTRAVENOUS
  Administered 2020-05-25 (×7): 1.2 ug/kg/h via INTRAVENOUS
  Administered 2020-05-26: 16:00:00 0.8 ug/kg/h via INTRAVENOUS
  Administered 2020-05-26: 1 ug/kg/h via INTRAVENOUS
  Administered 2020-05-26 (×3): 1.2 ug/kg/h via INTRAVENOUS
  Administered 2020-05-26: 1 ug/kg/h via INTRAVENOUS
  Administered 2020-05-27: 0.8 ug/kg/h via INTRAVENOUS
  Administered 2020-05-27: 04:00:00 1 ug/kg/h via INTRAVENOUS
  Administered 2020-05-27: 13:00:00 0.8 ug/kg/h via INTRAVENOUS
  Administered 2020-05-27: 22:00:00 0.6 ug/kg/h via INTRAVENOUS
  Administered 2020-05-27: 17:00:00 0.8 ug/kg/h via INTRAVENOUS
  Administered 2020-05-27: 1 ug/kg/h via INTRAVENOUS
  Administered 2020-05-28: 05:00:00 0.6 ug/kg/h via INTRAVENOUS
  Administered 2020-05-28 (×2): 0.599 ug/kg/h via INTRAVENOUS
  Administered 2020-05-29 – 2020-05-31 (×9): 0.6 ug/kg/h via INTRAVENOUS
  Filled 2020-05-17 (×38): qty 100

## 2020-05-17 MED ORDER — PROSOURCE TF PO LIQD
90.0000 mL | Freq: Three times a day (TID) | ORAL | Status: DC
  Administered 2020-05-17 – 2020-05-26 (×29): 90 mL
  Filled 2020-05-17: qty 90

## 2020-05-17 MED ORDER — VITAL AF 1.2 CAL PO LIQD
1000.0000 mL | ORAL | Status: DC
  Administered 2020-05-17 – 2020-05-26 (×4): 1000 mL

## 2020-05-17 MED ORDER — METOLAZONE 5 MG PO TABS
5.0000 mg | ORAL_TABLET | Freq: Once | ORAL | Status: AC
  Administered 2020-05-17: 5 mg via ORAL
  Filled 2020-05-17: qty 1

## 2020-05-17 MED ORDER — DOCUSATE SODIUM 50 MG/5ML PO LIQD
100.0000 mg | Freq: Two times a day (BID) | ORAL | Status: DC | PRN
  Administered 2020-05-18: 100 mg
  Filled 2020-05-17: qty 10

## 2020-05-17 MED ORDER — POLYETHYLENE GLYCOL 3350 17 G PO PACK: 17.0000 g | PACK | Freq: Every day | ORAL | Status: DC | PRN

## 2020-05-17 MED ORDER — INSULIN ASPART 100 UNIT/ML ~~LOC~~ SOLN
0.0000 [IU] | SUBCUTANEOUS | Status: DC
  Administered 2020-05-17: 11 [IU] via SUBCUTANEOUS
  Administered 2020-05-18: 15 [IU] via SUBCUTANEOUS
  Administered 2020-05-18 (×2): 8 [IU] via SUBCUTANEOUS
  Administered 2020-05-18 – 2020-05-19 (×3): 11 [IU] via SUBCUTANEOUS
  Administered 2020-05-19 (×2): 8 [IU] via SUBCUTANEOUS
  Administered 2020-05-19: 15 [IU] via SUBCUTANEOUS
  Administered 2020-05-19: 8 [IU] via SUBCUTANEOUS
  Administered 2020-05-19: 11 [IU] via SUBCUTANEOUS
  Administered 2020-05-20: 3 [IU] via SUBCUTANEOUS
  Administered 2020-05-20: 5 [IU] via SUBCUTANEOUS
  Administered 2020-05-20 (×2): 8 [IU] via SUBCUTANEOUS
  Administered 2020-05-20: 5 [IU] via SUBCUTANEOUS
  Administered 2020-05-20: 2 [IU] via SUBCUTANEOUS
  Administered 2020-05-21: 3 [IU] via SUBCUTANEOUS
  Administered 2020-05-21: 5 [IU] via SUBCUTANEOUS
  Administered 2020-05-21: 3 [IU] via SUBCUTANEOUS
  Administered 2020-05-21: 5 [IU] via SUBCUTANEOUS
  Administered 2020-05-21: 2 [IU] via SUBCUTANEOUS
  Administered 2020-05-21: 3 [IU] via SUBCUTANEOUS
  Administered 2020-05-22: 2 [IU] via SUBCUTANEOUS
  Administered 2020-05-22 – 2020-05-23 (×8): 3 [IU] via SUBCUTANEOUS
  Administered 2020-05-24: 2 [IU] via SUBCUTANEOUS
  Administered 2020-05-24: 3 [IU] via SUBCUTANEOUS
  Administered 2020-05-24 (×2): 2 [IU] via SUBCUTANEOUS
  Administered 2020-05-24 – 2020-05-25 (×4): 3 [IU] via SUBCUTANEOUS
  Administered 2020-05-25: 2 [IU] via SUBCUTANEOUS
  Administered 2020-05-25: 3 [IU] via SUBCUTANEOUS
  Administered 2020-05-26 (×2): 8 [IU] via SUBCUTANEOUS
  Administered 2020-05-26: 5 [IU] via SUBCUTANEOUS
  Administered 2020-05-26: 3 [IU] via SUBCUTANEOUS
  Administered 2020-05-26 (×3): 8 [IU] via SUBCUTANEOUS
  Administered 2020-05-27: 3 [IU] via SUBCUTANEOUS
  Administered 2020-05-27 (×2): 5 [IU] via SUBCUTANEOUS
  Administered 2020-05-27: 3 [IU] via SUBCUTANEOUS
  Filled 2020-05-17 (×55): qty 1

## 2020-05-17 MED ORDER — LINEZOLID 600 MG/300ML IV SOLN
600.0000 mg | Freq: Two times a day (BID) | INTRAVENOUS | Status: AC
  Administered 2020-05-17 – 2020-05-23 (×14): 600 mg via INTRAVENOUS
  Filled 2020-05-17 (×15): qty 300

## 2020-05-17 MED ORDER — PROSOURCE TF PO LIQD
45.0000 mL | Freq: Two times a day (BID) | ORAL | Status: DC
  Filled 2020-05-17: qty 45

## 2020-05-17 MED ORDER — FUROSEMIDE 10 MG/ML IJ SOLN
40.0000 mg | Freq: Once | INTRAMUSCULAR | Status: AC
  Administered 2020-05-17: 40 mg via INTRAVENOUS
  Filled 2020-05-17: qty 4

## 2020-05-17 NOTE — Progress Notes (Signed)
Billy Darby, MD [address]  [place]  [place], [place] [address]  Main: [phone]  Fax: [phone] Pager: [phone]   Subjective: No acute events overnight, patient's atrial flutter has been converted back to sinus rhythm over the weekend.  He is off pressors over the weekend.  Per patient's nurse, tube feeds have been started and no witnessed episodes of black stools, rectal bleeding.  He is currently being treated for MSSA bacteremia.  Remains intubated   Objective: Vital signs in last 24 hours: Vitals:   05/20/2020 1100 05/14/2020 1200 05/18/2020 1300 05/13/2020 1400  BP: (!) 139/59 135/62 132/61 130/66  Pulse: 95 94 91 90  Resp: $Remo'13 17 17 15  'OQdNr$ Temp: 99.5 F (37.5 C) 99.5 F (37.5 C) 99.68 F (37.6 C) 99.68 F (37.6 C)  TempSrc:      SpO2: 92% 92% 93% 93%  Weight:      Height:       Weight change:   Intake/Output Summary (Last 24 hours) at 05/07/2020 1600 Last data filed at 05/03/2020 0700 Gross per 24 hour  Intake 940.7 ml  Output 1025 ml  Net -84.3 ml     Exam: Heart:: Regular rate and rhythm, S1S2 present or without murmur or extra heart sounds Lungs: normal and clear to auscultation Abdomen: soft, nontender, normal bowel sounds   Lab Results: CBC Latest Ref Rng & Units 05/28/2020 05/16/2020 05/15/2020  WBC 4.0 - 10.5 K/uL 12.8(H) 15.6(H) 14.6(H)  Hemoglobin 13.0 - 17.0 g/dL 8.4(L) 9.6(L) 9.4(L)  Hematocrit 39.0 - 52.0 % 26.6(L) 28.5(L) 27.4(L)  Platelets 150 - 400 K/uL 123(L) 110(L) 104(L)   CMP Latest Ref Rng & Units 05/11/2020 05/16/2020 05/15/2020  Glucose 70 - 99 mg/dL 178(H) 103(H) 198(H)  BUN 8 - 23 mg/dL 96(H) 99(H) 112(H)  Creatinine 0.61 - 1.24 mg/dL 5.04(H) 4.89(H) 4.69(H)  Sodium 135 - 145 mmol/L 156(H) 154(H) 151(H)  Potassium 3.5 - 5.1 mmol/L 3.6 3.4(L) 3.9  Chloride 98 - 111 mmol/L 120(H) 119(H) 117(H)  CO2 22 - 32 mmol/L $RemoveB'25 25 24  'ehpqzYql$ Calcium 8.9 - 10.3 mg/dL 7.7(L) 8.2(L) 8.3(L)  Total Protein 6.5 - 8.1 g/dL 5.1(L) 5.2(L) 4.8(L)   Total Bilirubin 0.3 - 1.2 mg/dL 0.6 0.5 0.5  Alkaline Phos 38 - 126 U/L 58 60 48  AST 15 - 41 U/L $Remo'15 16 20  'FoZZT$ ALT 0 - 44 U/L <5 <5 6    Micro Results: Recent Results (from the past 240 hour(s))  Blood culture (routine single)     Status: Abnormal   Collection Time: 05/08/2020 10:24 PM   Specimen: BLOOD  Result Value Ref Range Status   Specimen Description   Final    BLOOD LEFT ASSIST CONTROL Performed at [facility], [address] [place], [place] [address]    Special Requests   Final    BOTTLES DRAWN AEROBIC AND ANAEROBIC Blood Culture adequate volume Performed at [facility], [address] [place], [place] [address]    Culture  Setup Time   Final    Organism ID to follow GRAM POSITIVE COCCI IN BOTH AEROBIC AND ANAEROBIC BOTTLES CRITICAL RESULT CALLED TO, READ BACK BY AND VERIFIED WITH: MYRA SLAUGHTER AT 13 ON 05/13/20 SNG GRAM NEGATIVE RODS ANAEROBIC BOTTLE ONLY CRITICAL RESULT CALLED TO, READ BACK BY AND VERIFIED WITH: MORGAN HICKS PHARMD $RemoveBefo'@[id number]'[id number]$  05/14/20 EB Performed at [facility], [place] [address] [place], [place] [address]    Culture STAPHYLOCOCCUS AUREUS PROTEUS MIRABILIS  (A)  Final  Report Status 05/16/2020 FINAL  Final   Organism ID, Bacteria STAPHYLOCOCCUS AUREUS  Final   Organism ID, Bacteria PROTEUS MIRABILIS  Final      Susceptibility   Proteus mirabilis - MIC*    AMPICILLIN <=2 SENSITIVE Sensitive     CEFAZOLIN <=4 SENSITIVE Sensitive     CEFEPIME <=0.12 SENSITIVE Sensitive     CEFTAZIDIME <=1 SENSITIVE Sensitive     CEFTRIAXONE <=0.25 SENSITIVE Sensitive     CIPROFLOXACIN <=0.25 SENSITIVE Sensitive     GENTAMICIN <=1 SENSITIVE Sensitive     IMIPENEM 8 INTERMEDIATE Intermediate     TRIMETH/SULFA <=20 SENSITIVE Sensitive     AMPICILLIN/SULBACTAM <=2 SENSITIVE Sensitive     PIP/TAZO <=4 SENSITIVE Sensitive     * PROTEUS MIRABILIS   Staphylococcus aureus - MIC*    CIPROFLOXACIN <=0.5 SENSITIVE Sensitive     ERYTHROMYCIN  <=0.25 SENSITIVE Sensitive     GENTAMICIN <=0.5 SENSITIVE Sensitive     OXACILLIN 0.5 SENSITIVE Sensitive     TETRACYCLINE <=1 SENSITIVE Sensitive     VANCOMYCIN 1 SENSITIVE Sensitive     TRIMETH/SULFA <=10 SENSITIVE Sensitive     CLINDAMYCIN <=0.25 SENSITIVE Sensitive     RIFAMPIN <=0.5 SENSITIVE Sensitive     Inducible Clindamycin NEGATIVE Sensitive     * STAPHYLOCOCCUS AUREUS  Culture, blood (single)     Status: None   Collection Time: 05/08/2020 10:24 PM   Specimen: BLOOD  Result Value Ref Range Status   Specimen Description BLOOD LEFT HAND  Final   Special Requests   Final    BOTTLES DRAWN AEROBIC AND ANAEROBIC Blood Culture adequate volume   Culture   Final    NO GROWTH 5 DAYS Performed at [facility], [address] [place], [place] [address]    Report Status 05/27/2020 FINAL  Final  Blood Culture ID Panel (Reflexed)     Status: Abnormal   Collection Time: 05/09/2020 10:24 PM  Result Value Ref Range Status   Enterococcus faecalis NOT DETECTED NOT DETECTED Final   Enterococcus Faecium NOT DETECTED NOT DETECTED Final   Listeria monocytogenes NOT DETECTED NOT DETECTED Final   Staphylococcus species DETECTED (A) NOT DETECTED Final    Comment: CRITICAL RESULT CALLED TO, READ BACK BY AND VERIFIED WITH: MYRA SLAUGHTER AT 1309 ON 05/13/20 SNG    Staphylococcus aureus (BCID) DETECTED (A) NOT DETECTED Final    Comment: CRITICAL RESULT CALLED TO, READ BACK BY AND VERIFIED WITH: MYRA SLAUGHTER AT 1309 05/13/20 SNG    Staphylococcus epidermidis NOT DETECTED NOT DETECTED Final   Staphylococcus lugdunensis NOT DETECTED NOT DETECTED Final   Streptococcus species NOT DETECTED NOT DETECTED Final   Streptococcus agalactiae NOT DETECTED NOT DETECTED Final   Streptococcus pneumoniae NOT DETECTED NOT DETECTED Final   Streptococcus pyogenes NOT DETECTED NOT DETECTED Final   A.calcoaceticus-baumannii NOT DETECTED NOT DETECTED Final   Bacteroides fragilis NOT DETECTED NOT DETECTED  Final   Enterobacterales NOT DETECTED NOT DETECTED Final   Enterobacter cloacae complex NOT DETECTED NOT DETECTED Final   Escherichia coli NOT DETECTED NOT DETECTED Final   Klebsiella aerogenes NOT DETECTED NOT DETECTED Final   Klebsiella oxytoca NOT DETECTED NOT DETECTED Final   Klebsiella pneumoniae NOT DETECTED NOT DETECTED Final   Proteus species NOT DETECTED NOT DETECTED Final   Salmonella species NOT DETECTED NOT DETECTED Final   Serratia marcescens NOT DETECTED NOT DETECTED Final   Haemophilus influenzae NOT DETECTED NOT DETECTED Final   Neisseria meningitidis NOT DETECTED NOT DETECTED Final   Pseudomonas  aeruginosa NOT DETECTED NOT DETECTED Final   Stenotrophomonas maltophilia NOT DETECTED NOT DETECTED Final   Candida albicans NOT DETECTED NOT DETECTED Final   Candida auris NOT DETECTED NOT DETECTED Final   Candida glabrata NOT DETECTED NOT DETECTED Final   Candida krusei NOT DETECTED NOT DETECTED Final   Candida parapsilosis NOT DETECTED NOT DETECTED Final   Candida tropicalis NOT DETECTED NOT DETECTED Final   Cryptococcus neoformans/gattii NOT DETECTED NOT DETECTED Final   Meth resistant mecA/C and MREJ NOT DETECTED NOT DETECTED Final    Comment: Performed at [facility], [address], [place], [place] [address]  Urine culture     Status: None   Collection Time: 05/13/20 12:26 AM   Specimen: In/Out Cath Urine  Result Value Ref Range Status   Specimen Description   Final    IN/OUT CATH URINE Performed at [facility], [address] [place], [place] [address]    Special Requests   Final    NONE Performed at [facility], [address], [place], [place] [address]    Culture   Final    NO GROWTH Performed at [facility], [place] [address], [place], [place] [address]    Report Status 05/14/2020 FINAL  Final  Resp Panel by RT-PCR (Flu A&B, Covid) Nasopharyngeal Swab     Status: None   Collection Time: 05/13/20 12:26 AM   Specimen:  Nasopharyngeal Swab; Nasopharyngeal(NP) swabs in vial transport medium  Result Value Ref Range Status   SARS Coronavirus 2 by RT PCR NEGATIVE NEGATIVE Final    Comment: (NOTE) SARS-CoV-2 target nucleic acids are NOT DETECTED.  The SARS-CoV-2 RNA is generally detectable in upper respiratory specimens during the acute phase of infection. The lowest concentration of SARS-CoV-2 viral copies this assay can detect is 138 copies/mL. A negative result does not preclude SARS-Cov-2 infection and should not be used as the sole basis for treatment or other patient management decisions. A negative result may occur with  improper specimen collection/handling, submission of specimen other than nasopharyngeal swab, presence of viral mutation(s) within the areas targeted by this assay, and inadequate number of viral copies(<138 copies/mL). A negative result must be combined with clinical observations, patient history, and epidemiological information. The expected result is Negative.  Fact Sheet for Patients:  EntrepreneurPulse.com.au  Fact Sheet for Healthcare Providers:  IncredibleEmployment.be  This test is no t yet approved or cleared by the [place] FDA and  has been authorized for detection and/or diagnosis of SARS-CoV-2 by FDA under an Emergency Use Authorization (EUA). This EUA will remain  in effect (meaning this test can be used) for the duration of the COVID-19 declaration under Section 564(b)(1) of the Act, 21 U.S.C.section 360bbb-3(b)(1), unless the authorization is terminated  or revoked sooner.       Influenza A by PCR NEGATIVE NEGATIVE Final   Influenza B by PCR NEGATIVE NEGATIVE Final    Comment: (NOTE) The Xpert Xpress SARS-CoV-2/FLU/RSV plus assay is intended as an aid in the diagnosis of influenza from Nasopharyngeal swab specimens and should not be used as a sole basis for treatment. Nasal washings and aspirates are unacceptable for  Xpert Xpress SARS-CoV-2/FLU/RSV testing.  Fact Sheet for Patients: EntrepreneurPulse.com.au  Fact Sheet for Healthcare Providers: IncredibleEmployment.be  This test is not yet approved or cleared by the [place] FDA and has been authorized for detection and/or diagnosis of SARS-CoV-2 by FDA under an Emergency Use Authorization (EUA). This EUA will remain in effect (meaning this  test can be used) for the duration of the COVID-19 declaration under Section 564(b)(1) of the Act, 21 U.S.C. section 360bbb-3(b)(1), unless the authorization is terminated or revoked.  Performed at [facility], [address] [place], [place] [address]   Culture, blood (Routine X 2) w Reflex to ID Panel     Status: None (Preliminary result)   Collection Time: 05/14/20  2:41 PM   Specimen: BLOOD  Result Value Ref Range Status   Specimen Description BLOOD RIGHT ANTECUBITAL  Final   Special Requests   Final    BOTTLES DRAWN AEROBIC AND ANAEROBIC Blood Culture adequate volume   Culture   Final    NO GROWTH 3 DAYS Performed at [facility], [address], [place], [place] [address]    Report Status PENDING  Incomplete  Culture, blood (Routine X 2) w Reflex to ID Panel     Status: None (Preliminary result)   Collection Time: 05/14/20  2:55 PM   Specimen: BLOOD  Result Value Ref Range Status   Specimen Description BLOOD BLOOD RIGHT HAND  Final   Special Requests   Final    BOTTLES DRAWN AEROBIC AND ANAEROBIC Blood Culture results may not be optimal due to an excessive volume of blood received in culture bottles   Culture   Final    NO GROWTH 3 DAYS Performed at [facility], [address], [place], [place] [address]    Report Status PENDING  Incomplete  Culture, respiratory (non-expectorated)     Status: None   Collection Time: 05/14/20  5:34 PM   Specimen: Tracheal Aspirate; Respiratory  Result Value Ref Range Status   Specimen  Description   Final    TRACHEAL ASPIRATE Performed at [facility], [address] [place], [place] [address]    Special Requests   Final    NONE Performed at [facility], [address] [place], [place] [address]    Gram Stain   Final    MODERATE WBC PRESENT, PREDOMINANTLY PMN FEW GRAM POSITIVE COCCI IN CLUSTERS Performed at [facility], [place] [address] [place], [place] [address]    Culture RARE METHICILLIN RESISTANT STAPHYLOCOCCUS AUREUS  Final   Report Status 05/03/2020 FINAL  Final   Organism ID, Bacteria METHICILLIN RESISTANT STAPHYLOCOCCUS AUREUS  Final      Susceptibility   Methicillin resistant staphylococcus aureus - MIC*    CIPROFLOXACIN >=8 RESISTANT Resistant     ERYTHROMYCIN >=8 RESISTANT Resistant     GENTAMICIN <=0.5 SENSITIVE Sensitive     OXACILLIN >=4 RESISTANT Resistant     TETRACYCLINE <=1 SENSITIVE Sensitive     VANCOMYCIN <=0.5 SENSITIVE Sensitive     TRIMETH/SULFA <=10 SENSITIVE Sensitive     CLINDAMYCIN >=8 RESISTANT Resistant     RIFAMPIN <=0.5 SENSITIVE Sensitive     Inducible Clindamycin NEGATIVE Sensitive     * RARE METHICILLIN RESISTANT STAPHYLOCOCCUS AUREUS  MRSA PCR Screening     Status: Abnormal   Collection Time: 05/14/20  9:04 PM   Specimen: Nasopharyngeal  Result Value Ref Range Status   MRSA by PCR POSITIVE (A) NEGATIVE Final    Comment:        The GeneXpert MRSA Assay (FDA approved for NASAL specimens only), is one component of a comprehensive MRSA colonization surveillance program. It is not intended to diagnose MRSA infection nor to guide or monitor treatment for MRSA infections. RESULT CALLED TO, READ BACK BY AND VERIFIED WITH: MELISSA COBB AT [phone] 05/15/20.PMF Performed  at [facility], [address] [place], [place] [address]    Studies/Results: DG Abd 1 View  Result Date: 05/05/2020 CLINICAL DATA:  Orogastric tube placement EXAM: ABDOMEN - 1 VIEW COMPARISON:  Portable exam 1105  hours compared to 05/13/2020 FINDINGS: Tip of orogastric tube projects over mid stomach. Nonobstructive bowel gas pattern. No acute osseous findings. IMPRESSION: Tip of orogastric tube projects over mid stomach. Electronically Signed   By: Lavonia Dana M.D.   On: 05/30/2020 11:21   DG Chest Port 1 View  Result Date: 05/22/2020 CLINICAL DATA:  Acute respiratory failure EXAM: PORTABLE CHEST 1 VIEW COMPARISON:  May 15, 2020 FINDINGS: Evaluation is limited by patient positioning. Portions of the lower thorax were not visualized on this study. The support devices appear essentially stable. There are persistent bilateral airspace opacities with a dense retrocardiac opacity. There is no pneumothorax. No definite acute osseous abnormality. IMPRESSION: No significant interval change. Electronically Signed   By: Constance Holster M.D.   On: 05/13/2020 04:28   ECHO TEE  Result Date: 05/19/2020    TRANSESOPHOGEAL ECHO REPORT   Patient Name:   Billy Barton Date of Exam: 05/27/2020 Medical Rec #:  [id number]    Height:       68.0 in Accession #:    [id number]   Weight:       241.6 lb Date of Birth:  11-Jun-1945    BSA:          2.215 m Patient Age:    34 years     BP:           123/63 mmHg Patient Gender: M            HR:           92 bpm. Exam Location:  [facility] Procedure: Transesophageal Echo, Color Doppler and Cardiac Doppler Indications:     Not listed on TEE check-in  History:         Patient has prior history of Echocardiogram examinations, most                  recent 05/14/2020. CAD; Risk Factors:Diabetes. AICD.  Sonographer:     Sherrie Sport RDCS (AE) Referring Phys:  [id number] [place] Diagnosing Phys: Kathlyn Sacramento MD PROCEDURE: The transesophogeal probe was passed without difficulty through the esophogus of the patient. Sedation performed by performing physician. Image quality was good. The patient's vital signs; including heart rate, blood pressure, and oxygen saturation; remained stable throughout the  procedure. The patient developed no complications during the procedure. IMPRESSIONS  1. Left ventricular ejection fraction, by estimation, is 45 to 50%. The left ventricle has mildly decreased function. Left ventricular endocardial border not optimally defined to evaluate regional wall motion.  2. Right ventricular systolic function is normal. The right ventricular size is normal. Tricuspid regurgitation signal is inadequate for assessing PA pressure.  3. No left atrial/left atrial appendage thrombus was detected.  4. The mitral valve is normal in structure. Mild mitral valve regurgitation. No evidence of mitral stenosis.  5. The aortic valve is normal in structure. Aortic valve regurgitation is not visualized. No aortic stenosis is present. Conclusion(s)/Recommendation(s): No evidence of vegetation/infective endocarditis on this transesophageal echocardiogram. FINDINGS  Left Ventricle: Left ventricular ejection fraction, by estimation, is 45 to 50%. The left ventricle has mildly decreased function. Left ventricular endocardial border not optimally defined to evaluate regional wall motion. The left ventricular internal cavity size was normal in size. There is no left ventricular  hypertrophy. Right Ventricle: The right ventricular size is normal. No increase in right ventricular wall thickness. Right ventricular systolic function is normal. Tricuspid regurgitation signal is inadequate for assessing PA pressure. Left Atrium: Left atrial size was normal in size. No left atrial/left atrial appendage thrombus was detected. Right Atrium: Right atrial size was normal in size. Pericardium: There is no evidence of pericardial effusion. Mitral Valve: The mitral valve is normal in structure. Mild mitral valve regurgitation. No evidence of mitral valve stenosis. Tricuspid Valve: The tricuspid valve is normal in structure. Tricuspid valve regurgitation is mild . No evidence of tricuspid stenosis. Aortic Valve: The aortic valve  is normal in structure. Aortic valve regurgitation is not visualized. No aortic stenosis is present. Pulmonic Valve: The pulmonic valve was normal in structure. Pulmonic valve regurgitation is not visualized. No evidence of pulmonic stenosis. Aorta: The aortic root is normal in size and structure. Venous: The inferior vena cava was not well visualized. IAS/Shunts: No atrial level shunt detected by color flow Doppler. Lorine Bears MD Electronically signed by Lorine Bears MD Signature Date/Time: 05/03/2020/10:29:26 AM    Final    Medications:  I have reviewed the patient's current medications. Prior to Admission:  No medications prior to admission.   Scheduled: . chlorhexidine gluconate (MEDLINE KIT)  15 mL Mouth Rinse BID  . Chlorhexidine Gluconate Cloth  6 each Topical Q0600  . feeding supplement (PROSource TF)  90 mL Per Tube TID  . insulin aspart  0-9 Units Subcutaneous Q4H  . insulin detemir  34 Units Subcutaneous Q12H  . mouth rinse  15 mL Mouth Rinse 10 times per day  . [START ON 05/18/2020] multivitamin with minerals  1 tablet Per Tube Daily  . mupirocin ointment  1 application Nasal BID  . pantoprazole (PROTONIX) IV  40 mg Intravenous Q12H   Continuous: . sodium chloride Stopped (05/13/20 0308)  . sodium chloride 5 mL/hr at 05/03/2020 0700  .  ceFAZolin (ANCEF) IV 2 g (05/26/2020 0925)  . dexmedetomidine (PRECEDEX) IV infusion    . dextrose 75 mL/hr at 05/06/2020 0700  . feeding supplement (VITAL AF 1.2 CAL) 1,000 mL (05/14/2020 1353)  . fentaNYL infusion INTRAVENOUS 100 mcg/hr (05/23/2020 0700)  . linezolid (ZYVOX) IV 600 mg (05/25/2020 1343)  . metronidazole 500 mg (05/18/2020 1502)   LAG:TXMIWO chloride, acetaminophen, dextrose, docusate, polyethylene glycol Anti-infectives (From admission, onward)   Start     Dose/Rate Route Frequency Ordered Stop   05/22/2020 1300  linezolid (ZYVOX) IVPB 600 mg        600 mg 300 mL/hr over 60 Minutes Intravenous Every 12 hours 05/25/2020 1210      05/14/20 2200  metroNIDAZOLE (FLAGYL) IVPB 500 mg        500 mg 100 mL/hr over 60 Minutes Intravenous Every 8 hours 05/14/20 1736     05/14/20 1515  metroNIDAZOLE (FLAGYL) IVPB 500 mg  Status:  Discontinued        500 mg 100 mL/hr over 60 Minutes Intravenous Every 8 hours 05/14/20 1429 05/14/20 1429   05/13/20 2200  ceFAZolin (ANCEF) IVPB 2g/100 mL premix        2 g 200 mL/hr over 30 Minutes Intravenous Every 12 hours 05/13/20 1939     05/13/20 1400  piperacillin-tazobactam (ZOSYN) IVPB 3.375 g  Status:  Discontinued        3.375 g 12.5 mL/hr over 240 Minutes Intravenous Every 8 hours 05/13/20 1008 05/13/20 1927   05/02/2020 2315  ceFEPIme (MAXIPIME) 2 g in sodium chloride  0.9 % 100 mL IVPB        2 g 200 mL/hr over 30 Minutes Intravenous  Once 05/07/2020 2309 05/13/20 0034   05/08/2020 2315  metroNIDAZOLE (FLAGYL) IVPB 500 mg        500 mg 100 mL/hr over 60 Minutes Intravenous  Once 05/11/2020 2309 05/13/20 0309   05/16/2020 2315  vancomycin (VANCOCIN) IVPB 1000 mg/200 mL premix  Status:  Discontinued        1,000 mg 200 mL/hr over 60 Minutes Intravenous  Once 05/02/2020 2309 05/05/2020 2313   05/15/2020 2315  vancomycin (VANCOREADY) IVPB 1500 mg/300 mL       "Followed by" Linked Group Details   1,500 mg 150 mL/hr over 120 Minutes Intravenous  Once 05/22/2020 2314 05/13/20 0323   05/30/2020 2315  vancomycin (VANCOCIN) IVPB 1000 mg/200 mL premix  Status:  Discontinued       "Followed by" Linked Group Details   1,000 mg 200 mL/hr over 60 Minutes Intravenous  Once 05/23/2020 2314 05/13/20 1927     Scheduled Meds: . chlorhexidine gluconate (MEDLINE KIT)  15 mL Mouth Rinse BID  . Chlorhexidine Gluconate Cloth  6 each Topical Q0600  . feeding supplement (PROSource TF)  90 mL Per Tube TID  . insulin aspart  0-9 Units Subcutaneous Q4H  . insulin detemir  34 Units Subcutaneous Q12H  . mouth rinse  15 mL Mouth Rinse 10 times per day  . [START ON 05/18/2020] multivitamin with minerals  1 tablet Per Tube Daily   . mupirocin ointment  1 application Nasal BID  . pantoprazole (PROTONIX) IV  40 mg Intravenous Q12H   Continuous Infusions: . sodium chloride Stopped (05/13/20 0308)  . sodium chloride 5 mL/hr at 05/07/2020 0700  .  ceFAZolin (ANCEF) IV 2 g (05/13/2020 0925)  . dexmedetomidine (PRECEDEX) IV infusion    . dextrose 75 mL/hr at 05/06/2020 0700  . feeding supplement (VITAL AF 1.2 CAL) 1,000 mL (05/21/2020 1353)  . fentaNYL infusion INTRAVENOUS 100 mcg/hr (05/29/2020 0700)  . linezolid (ZYVOX) IV 600 mg (05/29/2020 1343)  . metronidazole 500 mg (05/25/2020 1502)   PRN Meds:.sodium chloride, acetaminophen, dextrose, docusate, polyethylene glycol   Assessment: Active Problems:   DKA, type 2 (HCC)   Bacteremia   AICD (automatic cardioverter/defibrillator) present   Atrial flutter (HCC)   Gastrointestinal hemorrhage   Plan: Dark stools: Hemoglobin is stable No further episodes of dark stools within last 3 days, patient did not require any blood transfusion since 1/14 Continue tube feeds as tolerated Continue Protonix 40 mg IV twice daily No indication for endoscopy intervention at this time unless patient demonstrates recurrent signs of GI bleed  GI will sign off at this time, please call us back with questions or concerns   LOS: 5 days   Arlean Thies 05/05/2020, 4:00 PM

## 2020-05-17 NOTE — Progress Notes (Signed)
*  PRELIMINARY RESULTS* Echocardiogram Echocardiogram Transesophageal has been performed.  [name] 05/22/2020, 9:20 AM

## 2020-05-17 NOTE — Progress Notes (Signed)
Progress Note  Patient Name: Billy Barton Date of Encounter: 05/29/2020  [facility] Cardiologist: [facility]  Subjective   Intubated and sedated. He converted to sinus rhythm from atrial flutter over the weekend. A TEE was performed today at the bedside and showed no evidence of vegetations.  Inpatient Medications    Scheduled Meds: . chlorhexidine gluconate (MEDLINE KIT)  15 mL Mouth Rinse BID  . Chlorhexidine Gluconate Cloth  6 each Topical Q0600  . insulin aspart  0-9 Units Subcutaneous Q4H  . insulin detemir  34 Units Subcutaneous Q12H  . mouth rinse  15 mL Mouth Rinse 10 times per day  . midazolam  2 mg Intravenous Once  . mupirocin ointment  1 application Nasal BID  . pantoprazole (PROTONIX) IV  40 mg Intravenous Q12H   Continuous Infusions: . sodium chloride Stopped (05/13/20 0308)  . sodium chloride 5 mL/hr at 05/09/2020 0700  .  ceFAZolin (ANCEF) IV 2 g (05/02/2020 0925)  . dextrose 75 mL/hr at 05/16/2020 0700  . fentaNYL infusion INTRAVENOUS 100 mcg/hr (05/11/2020 0700)  . metronidazole Stopped (05/12/2020 0645)  . midazolam 1 mg/hr (05/02/2020 0700)  . norepinephrine (LEVOPHED) Adult infusion Stopped (05/14/20 2355)  . potassium chloride 10 mEq (05/01/2020 0944)  . vasopressin Stopped (05/15/20 1044)   PRN Meds: sodium chloride, acetaminophen, dextrose, docusate sodium, polyethylene glycol, vecuronium   Vital Signs    Vitals:   05/09/2020 0430 05/16/2020 0500 05/27/2020 0600 05/14/2020 0700  BP:  130/63 127/61 123/63  Pulse: 91 93 92 92  Resp: $Remo'19 14 17 17  'LDhQQ$ Temp: 98.6 F (37 C) 98.6 F (37 C) 98.78 F (37.1 C) 98.96 F (37.2 C)  TempSrc:      SpO2: (!) 89% 92% 93% 93%  Weight:      Height:        Intake/Output Summary (Last 24 hours) at 05/30/2020 1030 Last data filed at 05/29/2020 0700 Gross per 24 hour  Intake 1113.11 ml  Output 1175 ml  Net -61.89 ml   Last 3 Weights 05/16/2020 05/14/2020 05/09/2020  Weight (lbs) 241 lb 10 oz 242 lb 11.6 oz 280 lb  Weight (kg)  109.6 kg 110.1 kg 127.007 kg      Telemetry    Sinus rhythm- Personally Reviewed  ECG    No new tracing obtained- Personally Reviewed  Physical Exam   GEN: Intubated, sedated  HEENT: Normal  NECK: No JVD; No carotid bruits  CARDIAC: RRR, heart sounds appear distant  RESPIRATORY: Vented breath sounds  ABDOMEN: Soft, non-tender, non-distended  MUSCULOSKELETAL:  No edema; No deformity   SKIN: Warm and dry  NEUROLOGIC: Unable to assess  PSYCHIATRIC: Unable to assess    Labs    High Sensitivity Troponin:   Recent Labs  Lab 05/18/2020 2222 05/13/20 0026 05/13/20 0218 05/13/20 0548  TROPONINIHS 50* 47* 52* 67*      Chemistry Recent Labs  Lab 05/15/20 0558 05/16/20 0551 05/13/2020 0326  NA 151* 154* 156*  K 3.9 3.4* 3.6  CL 117* 119* 120*  CO2 $Re'24 25 25  'jaj$ GLUCOSE 198* 103* 178*  BUN 112* 99* 96*  CREATININE 4.69* 4.89* 5.04*  CALCIUM 8.3* 8.2* 7.7*  PROT 4.8* 5.2* 5.1*  ALBUMIN 1.9* 1.8* 1.7*  AST $Re'20 16 15  'nrR$ ALT 6 <5 <5  ALKPHOS 48 60 58  BILITOT 0.5 0.5 0.6  GFRNONAA 12* 12* 11*  ANIONGAP $RemoveB'10 10 11     'hDOoEcHP$ Hematology Recent Labs  Lab 05/15/20 0558 05/16/20 0551 05/09/2020 0326  WBC 14.6*  15.6* 12.8*  RBC 3.07* 3.12* 2.83*  HGB 9.4* 9.6* 8.4*  HCT 27.4* 28.5* 26.6*  MCV 89.3 91.3 94.0  MCH 30.6 30.8 29.7  MCHC 34.3 33.7 31.6  RDW 19.4* 20.7* 20.5*  PLT 104* 110* 123*    BNPNo results for input(s): BNP, PROBNP in the last 168 hours.   DDimer No results for input(s): DDIMER in the last 168 hours.   Radiology    DG Chest Port 1 View  Result Date: 05/12/2020 CLINICAL DATA:  Acute respiratory failure EXAM: PORTABLE CHEST 1 VIEW COMPARISON:  May 15, 2020 FINDINGS: Evaluation is limited by patient positioning. Portions of the lower thorax were not visualized on this study. The support devices appear essentially stable. There are persistent bilateral airspace opacities with a dense retrocardiac opacity. There is no pneumothorax. No definite acute osseous  abnormality. IMPRESSION: No significant interval change. Electronically Signed   By: Constance Holster M.D.   On: 05/25/2020 04:28   ECHO TEE  Result Date: 05/26/2020    TRANSESOPHOGEAL ECHO REPORT   Patient Name:   [name] Date of Exam: 05/02/2020 Medical Rec #:  [id number]    Height:       68.0 in Accession #:    [id number]   Weight:       241.6 lb Date of Birth:  1945-08-12    BSA:          2.215 m Patient Age:    43 years     BP:           123/63 mmHg Patient Gender: M            HR:           92 bpm. Exam Location:  [facility] Procedure: Transesophageal Echo, Color Doppler and Cardiac Doppler Indications:     Not listed on TEE check-in  History:         Patient has prior history of Echocardiogram examinations, most                  recent 05/14/2020. CAD; Risk Factors:Diabetes. AICD.  Sonographer:     Sherrie Sport RDCS (AE) Referring Phys:  [id number] Kimball Diagnosing Phys: Kathlyn Sacramento MD PROCEDURE: The transesophogeal probe was passed without difficulty through the esophogus of the patient. Sedation performed by performing physician. Image quality was good. The patient's vital signs; including heart rate, blood pressure, and oxygen saturation; remained stable throughout the procedure. The patient developed no complications during the procedure. IMPRESSIONS  1. Left ventricular ejection fraction, by estimation, is 45 to 50%. The left ventricle has mildly decreased function. Left ventricular endocardial border not optimally defined to evaluate regional wall motion.  2. Right ventricular systolic function is normal. The right ventricular size is normal. Tricuspid regurgitation signal is inadequate for assessing PA pressure.  3. No left atrial/left atrial appendage thrombus was detected.  4. The mitral valve is normal in structure. Mild mitral valve regurgitation. No evidence of mitral stenosis.  5. The aortic valve is normal in structure. Aortic valve regurgitation is not visualized. No aortic  stenosis is present. Conclusion(s)/Recommendation(s): No evidence of vegetation/infective endocarditis on this transesophageal echocardiogram. FINDINGS  Left Ventricle: Left ventricular ejection fraction, by estimation, is 45 to 50%. The left ventricle has mildly decreased function. Left ventricular endocardial border not optimally defined to evaluate regional wall motion. The left ventricular internal cavity size was normal in size. There is no left ventricular hypertrophy. Right Ventricle: The right ventricular size is normal. No  increase in right ventricular wall thickness. Right ventricular systolic function is normal. Tricuspid regurgitation signal is inadequate for assessing PA pressure. Left Atrium: Left atrial size was normal in size. No left atrial/left atrial appendage thrombus was detected. Right Atrium: Right atrial size was normal in size. Pericardium: There is no evidence of pericardial effusion. Mitral Valve: The mitral valve is normal in structure. Mild mitral valve regurgitation. No evidence of mitral valve stenosis. Tricuspid Valve: The tricuspid valve is normal in structure. Tricuspid valve regurgitation is mild . No evidence of tricuspid stenosis. Aortic Valve: The aortic valve is normal in structure. Aortic valve regurgitation is not visualized. No aortic stenosis is present. Pulmonic Valve: The pulmonic valve was normal in structure. Pulmonic valve regurgitation is not visualized. No evidence of pulmonic stenosis. Aorta: The aortic root is normal in size and structure. Venous: The inferior vena cava was not well visualized. IAS/Shunts: No atrial level shunt detected by color flow Doppler. Kathlyn Sacramento MD Electronically signed by Kathlyn Sacramento MD Signature Date/Time: 05/02/2020/10:29:26 AM    Final     Cardiac Studies   05/2020 1. Left ventricular ejection fraction, by estimation, is 50 to 55%. The  left ventricle has low normal function. Left ventricular endocardial  border not  optimally defined to evaluate regional wall motion. There is  mild left ventricular hypertrophy. Left  ventricular diastolic parameters are indeterminate.  2. Right ventricular systolic function is normal. The right ventricular  size is normal. Tricuspid regurgitation signal is inadequate for assessing  PA pressure.  3. The mitral valve is normal in structure. No evidence of mitral valve  regurgitation. No evidence of mitral stenosis.  4. The aortic valve is normal in structure. Aortic valve regurgitation is  not visualized. No aortic stenosis is present.  5. Aortic dilatation noted. There is moderate dilatation of the ascending  aorta, measuring 45 mm.  6. The inferior vena cava is dilated in size with >50% respiratory  variability, suggesting right atrial pressure of 8 mmHg.  7. RV pacemaker lead is thickend. Can not rule out vegetation. Consider a  TEE  8. Challenging image quality   Patient Profile     75 y.o. male with history of AICD placement, CAD/MI, diabetes presenting with altered mental status, diagnosed with septic shock and MSSA bacteremia.  Intubated for respiratory failure, hospital course complicated by development of atrial flutter.  Assessment & Plan    1.  Atrial flutter  -Converted to sinus rhythm yesterday. -Not on anticoagulation due to blood loss anemia. -Echo with preserved EF   2. MSSA bacteremia, AICD  -Antibiotics as per primary team  Transesophageal echocardiogram showed no evidence of vegetation. I am going to discuss with Dr. Delaine Lame to see if there is an indication for the device to be explanted. In that case, we might need to get EP involved.    3. AICD-[employer] device  -Unclear reasoning for implantation -No events noted on interrogation.   4. Anemia  -Status post packed red blood cells  -H&H stable since transfusion  5.  Sepsis, respiratory failure -Management as per ICU team    Greater than 50% was spent in counseling and  coordination of care with patient  Total encounter time 35 minutes or more          Signed, Kathlyn Sacramento, MD  05/03/2020, 10:30 AM

## 2020-05-17 NOTE — Progress Notes (Signed)
NAME:  Billy Barton, MRN:  [id number], DOB:  25-Mar-1946, LOS: 5 ADMISSION DATE:  05/18/2020, CONSULTATION DATE:  05/14/2020 REFERRING MD:  Minna Antis, CHIEF COMPLAINT:  DKA, altered mental status   Brief History:  75yo male w/unknown past medical history who presented to the ED w/AMS and was found to be in DKA vs HSS with a blood sugar >600 and lactate 5.8  History of Present Illness:  75yo male w/unknown past medical history who presented to the ED via EMS after the son found him unresponsive, face down in the kitchen, incontinent of urine. Per the ED, the patient son reported to them they call and check on him daily and last spoke with him yesterday. When EMS arrived, the patient was awake but became unresponsive en route to the ED.   On arrival to the ED, the patient was arousable only to painful stimuli, hypothermic 94 otherwise stable vital signs. Labs revealed a blood glucose > 600, lactate 5.8, WBC 26.2, Hgb 7.1 and VBG: 7.19/36 and bicarb 13. He was given 1L LR bolus, started on endotool and antibiotics were initiated. Rapid COVID test negative  ICU was called for admission.   05/14/20 - patient with MSSA bacteremia, critially ill on mechanical ventilator.  Remains on vasopressor. Insulin gtt is off today.  Active GI bleed s/p PRBC transfusion. 05/15/20- patient remains hypotensive on MV.  Possible EGD when more stable - spoke with GI- Dr Lacretia Nicks today. Still with edema 2+ on vasopressors.  05/16/20- patient on MV.  A line not functioning will dc today.  Pt in sinus rhythm remains intubated septic awaiting tee for endocarditis workup.  29-May-2020 - TEE performed at bedside.   Past Medical History:  Unknown  Significant Hospital Events:  1/13-Admitted to ICU  Consults:  GI Cardiology ID  Procedures:  ETT 1/13>> A line 1/13 - 1/16 CVL 1/13>>  Significant Diagnostic Tests:  CT head 05/13/20 Stable non contrast CT appearance of the brain. No CT evidence of anoxic injury. 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  TEE 05/29/2020 EF 40-50% RV systolic function normal No evidence of vegetation/infective endocarditis  Micro Data:  Bld Cx 05/10/2020 - MSSA Bld Cx 05/14/20 - Negative  Tracheal Aspirate 05/14/20 - MRSA  Antimicrobials:  Vancomycin 1/12 Cefepime 1/12  Flagyl 1/12 Zosyn 1/13  Cefazolin 1/13>>  Interim History / Subjective:  No acute events overnight. TEE performed at bedside this AM, no vegetations noted.   Patient remains intubated and sedated.   Last transfused 05/14/20  Objective   Blood pressure (!) 143/58, pulse 96, temperature 99.32 F (37.4 C), resp. rate 17, height 5\' 8"  (1.727 m), weight 109.6 kg, SpO2 91 %.    Vent Mode: PRVC FiO2 (%):  [28 %] 28 % Set Rate:  [16 bmp] 16 bmp Vt Set:  [500 mL] 500 mL PEEP:  [5 cmH20] 5 cmH20 Plateau Pressure:  [14 cmH20-18 cmH20] 18 cmH20   Intake/Output Summary (Last 24 hours) at May 29, 2020 1112 Last data filed at 05-29-2020 0700 Gross per 24 hour  Intake 1113.11 ml  Output 1175 ml  Net -61.89 ml   Filed Weights   05/01/2020 2206 05/14/20 0342 05/16/20 0500  Weight: 127 kg 110.1 kg 109.6 kg    Examination: General: intubated, sedated, no acute distress HENT: [place]/AT, PERRL, sclera anicteric Lungs: Clear to auscultation Cardiovascular: RRR, s1s2, no murmur Abdomen: soft, non-distended, BS+ Extremities: trace edema, warm Neuro: sedated GU: Foley catheter in place  Resolved Hospital Problem list  DKA  Assessment & Plan:   Acute Hypoxemic Respiratory Failure MRSA Pneumonia In setting of septic shock due to MSSA bacteremia and Cardiac Arrest - Barrier to extubation appears to be mental status at this time  - Tracheal aspirate from 05/14/20 has grown MRSA and he continues to have purulent appearing secretions - Would recommend treating with 7 day course of linezolid given his renal function. Would also recommend stopping flagyl. - Will try to wean sedation and perform  SBT if possible  Shock/Cardiac arrest In setting of sepsis due to MSSA bacteremia - He has been weaned off vasopressor support  Acute Kidney Injury, non-oliguric Hypernatremia In setting of septic shock - Unknown baseline creatinine and renal function worsening since admission - Give lasix 40mg  IV and 5mg  metolazone PO - Will consult nephrology for further input  MSSA Bacteremia - ID following, treating with cefazolin - TEE negative for vegetations  Acute Blood Loss Anemia In setting of acute GI bleeding secondary to sepsis - Last transfusion 05/14/20 - GI not planning to scope  - Continue PPI BID  Diabetes Mellitus  - SSI q4hrs - Levemir 34 units BID Will need to watch closely today with initiation of TF   Best practice (evaluated daily)  Diet: TF Pain/Anxiety/Delirium protocol (if indicated): Fentanyl, precedex VAP protocol (if indicated): ordered DVT prophylaxis: scds GI prophylaxis: Pantoprazole BID Glucose control: SSI, levemir 34 units BID Mobility: bed rest Disposition:ICU Code Status: Full  Labs   CBC: Recent Labs  Lab 05/29/2020 2222 05/13/20 0026 05/14/20 0838 05/14/20 2104 05/15/20 0558 05/16/20 0551 05/30/2020 0326  WBC 26.2*   < > 18.6* 13.6* 14.6* 15.6* 12.8*  NEUTROABS 21.6*  --   --  10.8*  --   --   --   HGB 7.1*   < > 7.5* 9.3* 9.4* 9.6* 8.4*  HCT 22.0*   < > 21.5* 26.9* 27.4* 28.5* 26.6*  MCV 99.5   < > 89.6 88.8 89.3 91.3 94.0  PLT 227   < > 134* 108* 104* 110* 123*   < > = values in this interval not displayed.    Basic Metabolic Panel: Recent Labs  Lab 05/13/20 1830 05/13/20 2245 05/14/20 0333 05/15/20 0558 05/16/20 0551 05/16/2020 0326  NA 147*  --  147* 151* 154* 156*  K 3.3*  --  3.9 3.9 3.4* 3.6  CL 111  --  111 117* 119* 120*  CO2 22  --  22 24 25 25   GLUCOSE 192*  --  289* 198* 103* 178*  BUN 130*  --  119* 112* 99* 96*  CREATININE 4.26*  --  4.46* 4.69* 4.89* 5.04*  CALCIUM 8.6*  --  8.0* 8.3* 8.2* 7.7*  MG  --  1.8 1.9  1.8 1.9 1.9  PHOS  --  2.5 3.0 2.8 2.3* 3.3   GFR: Estimated Creatinine Clearance: 15.4 mL/min (A) (by C-G formula based on SCr of 5.04 mg/dL (H)). Recent Labs  Lab 05/13/2020 2222 05/13/20 0026 05/13/20 0218 05/13/20 0548 05/13/20 1000 05/14/20 2104 05/15/20 0558 05/16/20 0551 05/18/2020 0326  WBC 26.2* 21.4* 29.4* 27.2*   < > 13.6* 14.6* 15.6* 12.8*  LATICACIDVEN 5.8* 2.8* 3.4* 2.0*  --   --   --   --   --    < > = values in this interval not displayed.    Liver Function Tests: Recent Labs  Lab 05/07/2020 2222 05/14/20 0333 05/15/20 0558 05/16/20 0551 05/18/2020 0326  AST 24 21 20 16 15   ALT 11  10 6 <5 <5  ALKPHOS 68 46 48 60 58  BILITOT 0.9 0.9 0.5 0.5 0.6  PROT 5.2* 4.8* 4.8* 5.2* 5.1*  ALBUMIN 2.5* 2.1* 1.9* 1.8* 1.7*   No results for input(s): LIPASE, AMYLASE in the last 168 hours. No results for input(s): AMMONIA in the last 168 hours.  ABG    Component Value Date/Time   PHART 7.40 05/14/2020 0333   PCO2ART 32 05/14/2020 0333   PO2ART 87 05/14/2020 0333   HCO3 19.8 (L) 05/14/2020 0333   ACIDBASEDEF 4.6 (H) 05/14/2020 0333   O2SAT 96.6 05/14/2020 0333     Coagulation Profile: Recent Labs  Lab 2020-05-23 2222 05/13/20 0218  INR 1.4* 1.5*    Cardiac Enzymes: Recent Labs  Lab 05-23-2020 2222  CKTOTAL 149    HbA1C: Hgb A1c MFr Bld  Date/Time Value Ref Range Status  05/13/2020 02:49 PM 10.2 (H) 4.8 - 5.6 % Final    Comment:    (NOTE) Pre diabetes:          5.7%-6.4%  Diabetes:              >6.4%  Glycemic control for   <7.0% adults with diabetes     CBG: Recent Labs  Lab 05/16/20 1600 05/16/20 1933 05/10/2020 0010 05/02/2020 0333 05/13/2020 0732  GLUCAP 105* 114* 151* 156* 169*     Critical care time: 44     Melody Comas, MD [facility] Office: [phone]   See Amion for Pager Details

## 2020-05-17 NOTE — Progress Notes (Signed)
Inpatient Diabetes Program Recommendations  AACE/ADA: New Consensus Statement on Inpatient Glycemic Control (2015)  Target Ranges:  Prepandial:   less than 140 mg/dL      Peak postprandial:   less than 180 mg/dL (1-2 hours)      Critically ill patients:  140 - 180 mg/dL   Results for BURDETT, PINZON (MRN [id number]) as of 05/21/2020 10:22  Ref. Range 05/15/2020 23:49 05/16/2020 04:15 05/16/2020 07:12 05/16/2020 10:59 05/16/2020 11:20 05/16/2020 16:00 05/16/2020 19:33  Glucose-Capillary Latest Ref Range: 70 - 99 mg/dL 939 (H)  2 units NOVOLOG  106 (H) 86 69 (L) 120 (H) 105 (H) 114 (H)   Results for AVENIR, LOZINSKI (MRN [id number]) as of 05/18/2020 10:22  Ref. Range 05/12/2020 00:10 05/14/2020 03:33 05/03/2020 07:32  Glucose-Capillary Latest Ref Range: 70 - 99 mg/dL 076 (H) 226 (H)  2 units NOVOLOG  169 (H)  2 units NOVOLOG  34 units LEVEMIR @9 :25am    Diabetes history: Unknown Outpatient Diabetes meds: Unknown Current orders: Levemir 34 units BID     Novolog Sensitive Correction Scale/ SSI (0-9 units) Q4hours     MD- Please note that both doses of Levemir insulin were HELD yesterday 01/16  CBGs so far since 12am today: 151-156-169  May consider reduction of Levemir to half current dose since pt had some mild Hypoglycemia yesterday and no Levemir given at all yesterday  Consider reduction of Levemir to 17 units BID     --Will follow patient during hospitalization--  03-03-1971 RN, MSN, CDE Diabetes Coordinator Inpatient Glycemic Control Team Team Pager: [phone] (8a-5p)

## 2020-05-18 ENCOUNTER — Inpatient Hospital Stay: Payer: No Typology Code available for payment source

## 2020-05-18 DIAGNOSIS — E1111 Type 2 diabetes mellitus with ketoacidosis with coma: Secondary | ICD-10-CM | POA: Diagnosis not present

## 2020-05-18 DIAGNOSIS — R4182 Altered mental status, unspecified: Secondary | ICD-10-CM

## 2020-05-18 DIAGNOSIS — K921 Melena: Secondary | ICD-10-CM

## 2020-05-18 DIAGNOSIS — K922 Gastrointestinal hemorrhage, unspecified: Secondary | ICD-10-CM

## 2020-05-18 DIAGNOSIS — R7881 Bacteremia: Secondary | ICD-10-CM | POA: Diagnosis not present

## 2020-05-18 DIAGNOSIS — N179 Acute kidney failure, unspecified: Secondary | ICD-10-CM

## 2020-05-18 LAB — GLUCOSE, CAPILLARY
Glucose-Capillary: 323 mg/dL — ABNORMAL HIGH (ref 70–99)
Glucose-Capillary: 314 mg/dL — ABNORMAL HIGH (ref 70–99)
Glucose-Capillary: 355 mg/dL — ABNORMAL HIGH (ref 70–99)
Glucose-Capillary: 300 mg/dL — ABNORMAL HIGH (ref 70–99)
Glucose-Capillary: 300 mg/dL — ABNORMAL HIGH (ref 70–99)
Glucose-Capillary: 354 mg/dL — ABNORMAL HIGH (ref 70–99)

## 2020-05-18 LAB — CBC
HCT: 25.6 % — ABNORMAL LOW (ref 39.0–52.0)
HCT: 29.2 % — ABNORMAL LOW (ref 39.0–52.0)
Hemoglobin: 8.1 g/dL — ABNORMAL LOW (ref 13.0–17.0)
Hemoglobin: 9.3 g/dL — ABNORMAL LOW (ref 13.0–17.0)
MCH: 29.7 pg (ref 26.0–34.0)
MCH: 29.9 pg (ref 26.0–34.0)
MCHC: 31.6 g/dL (ref 30.0–36.0)
MCHC: 31.8 g/dL (ref 30.0–36.0)
MCV: 93.8 fL (ref 80.0–100.0)
MCV: 93.9 fL (ref 80.0–100.0)
Platelets: 171 10*3/uL (ref 150–400)
Platelets: 193 10*3/uL (ref 150–400)
RBC: 2.73 MIL/uL — ABNORMAL LOW (ref 4.22–5.81)
RBC: 3.11 MIL/uL — ABNORMAL LOW (ref 4.22–5.81)
RDW: 19.9 % — ABNORMAL HIGH (ref 11.5–15.5)
RDW: 19.7 % — ABNORMAL HIGH (ref 11.5–15.5)
WBC: 9.7 10*3/uL (ref 4.0–10.5)
WBC: 12 10*3/uL — ABNORMAL HIGH (ref 4.0–10.5)
nRBC: 0.6 % — ABNORMAL HIGH (ref 0.0–0.2)
nRBC: 0.4 % — ABNORMAL HIGH (ref 0.0–0.2)

## 2020-05-18 LAB — COMPREHENSIVE METABOLIC PANEL
ALT: <5 U/L (ref 0–44)
AST: 12 U/L — ABNORMAL LOW (ref 15–41)
Albumin: 1.6 g/dL — ABNORMAL LOW (ref 3.5–5.0)
Alkaline Phosphatase: 65 U/L (ref 38–126)
Anion gap: 10 (ref 5–15)
BUN: 96 mg/dL — ABNORMAL HIGH (ref 8–23)
CO2: 23 mmol/L (ref 22–32)
Calcium: 7.5 mg/dL — ABNORMAL LOW (ref 8.9–10.3)
Chloride: 116 mmol/L — ABNORMAL HIGH (ref 98–111)
Creatinine, Ser: 4.93 mg/dL — ABNORMAL HIGH (ref 0.61–1.24)
GFR, Estimated: 12 mL/min — ABNORMAL LOW (ref >60)
Glucose, Bld: 367 mg/dL — ABNORMAL HIGH (ref 70–99)
Potassium: 3.6 mmol/L (ref 3.5–5.1)
Sodium: 149 mmol/L — ABNORMAL HIGH (ref 135–145)
Total Bilirubin: 0.5 mg/dL (ref 0.3–1.2)
Total Protein: 5.1 g/dL — ABNORMAL LOW (ref 6.5–8.1)

## 2020-05-18 LAB — PROTEIN / CREATININE RATIO, URINE
Creatinine, Urine: 61 mg/dL
Protein Creatinine Ratio: 2.13 mg/mg{Cre} — ABNORMAL HIGH (ref 0.00–0.15)
Total Protein, Urine: 130 mg/dL

## 2020-05-18 LAB — PHOSPHORUS
Phosphorus: 3.7 mg/dL (ref 2.5–4.6)
Phosphorus: 3.9 mg/dL (ref 2.5–4.6)

## 2020-05-18 LAB — MAGNESIUM: Magnesium: 2.1 mg/dL (ref 1.7–2.4)

## 2020-05-18 MED ORDER — CEFAZOLIN SODIUM-DEXTROSE 2-4 GM/100ML-% IV SOLN
2.0000 g | Freq: Two times a day (BID) | INTRAVENOUS | Status: DC
  Administered 2020-05-18 – 2020-05-20 (×4): 2 g via INTRAVENOUS
  Filled 2020-05-18 (×5): qty 100

## 2020-05-18 MED ORDER — FREE WATER
200.0000 mL | Status: DC
  Administered 2020-05-18 – 2020-05-20 (×13): 200 mL

## 2020-05-18 MED ORDER — INSULIN ASPART 100 UNIT/ML ~~LOC~~ SOLN: 3.0000 [IU] | SUBCUTANEOUS | Status: DC

## 2020-05-18 MED ORDER — INSULIN ASPART 100 UNIT/ML ~~LOC~~ SOLN
3.0000 [IU] | SUBCUTANEOUS | Status: DC
  Administered 2020-05-18 (×3): 3 [IU] via SUBCUTANEOUS
  Filled 2020-05-18 (×3): qty 1

## 2020-05-18 MED ORDER — INSULIN ASPART 100 UNIT/ML ~~LOC~~ SOLN
6.0000 [IU] | SUBCUTANEOUS | Status: DC
  Administered 2020-05-18 – 2020-05-19 (×4): 6 [IU] via SUBCUTANEOUS
  Filled 2020-05-18 (×4): qty 1

## 2020-05-18 MED ORDER — INSULIN DETEMIR 100 UNIT/ML ~~LOC~~ SOLN
38.0000 [IU] | Freq: Two times a day (BID) | SUBCUTANEOUS | Status: DC
  Administered 2020-05-18: 38 [IU] via SUBCUTANEOUS
  Filled 2020-05-18 (×3): qty 0.38

## 2020-05-18 NOTE — Progress Notes (Signed)
Inpatient Diabetes Program Recommendations  AACE/ADA: New Consensus Statement on Inpatient Glycemic Control (2015)  Target Ranges:  Prepandial:   less than 140 mg/dL      Peak postprandial:   less than 180 mg/dL (1-2 hours)      Critically ill patients:  140 - 180 mg/dL   Lab Results  Component Value Date   GLUCAP 355 (H) 05/18/2020   HGBA1C 10.2 (H) 05/13/2020    Review of Glycemic Control Results for THAINE, GARRIGA (MRN [id number]) as of 05/18/2020 12:44  Ref. Range 05/14/2020 19:17 05/03/2020 23:09 05/18/2020 03:03 05/18/2020 07:22 05/18/2020 11:20  Glucose-Capillary Latest Ref Range: 70 - 99 mg/dL 703 (H) 500 (H) 938 (H) 314 (H) 355 (H)  Diabetes history:Unknown Outpatient Diabetes meds:Unknown Current orders: Levemir 34 units BID                           Novolog moderate Correction Scale/ SSI (0-15 units) Q 4hours     Novolog 3 units q 4 hours  Inpatient Diabetes Program Recommendations:    Note feeds started on 05/23/2020- Consider further increase of Novolog tube feed coverage to 6 units q 4 hours.    Thanks,  Beryl Meager, RN, BC-ADM Inpatient Diabetes Coordinator Pager [phone] (8a-5p)

## 2020-05-18 NOTE — Progress Notes (Signed)
PHARMACY CONSULT NOTE - FOLLOW UP  Pharmacy Consult for Electrolyte Monitoring and Replacement   Recent Labs: Potassium (mmol/L)  Date Value  05/18/2020 3.6   Magnesium (mg/dL)  Date Value  44/06/4740 2.1   Calcium (mg/dL)  Date Value  59/56/3875 7.5 (L)   Albumin (g/dL)  Date Value  64/33/2951 1.6 (L)   Phosphorus (mg/dL)  Date Value  88/41/6606 3.7   Sodium (mmol/L)  Date Value  05/18/2020 149 (H)   Assessment: 75 year old male admitted with metabolic acidosis s/t DKA vs HHS. Code Blue called after arrival to ICU and patient intubated. Blood cultures positive for MSSA. Patient remains on insulin and bicarb drips. Pharmacy to manage electrolytes.  1/13 2245 Phos 2.5, Mg 1.8 (WNL)  Goal of Therapy:  Electrolytes WNL, K ~ 4  Plan:  Electrolytes stable. Started on tube feeds. Will continue to follow along closely.  Pricilla Riffle ,PharmD Clinical Pharmacist 05/18/2020 11:59 AM

## 2020-05-18 NOTE — Progress Notes (Signed)
Called to bedside due to patient having a moderate size liquid black tarry stool. CBC pending.    Amanda Cockayne ACNP-BC

## 2020-05-18 NOTE — Progress Notes (Addendum)
NAME:  Billy Barton, MRN:  [id number], DOB:  1945/11/27, LOS: 6 ADMISSION DATE:  05-Jun-2020, CONSULTATION DATE:  Jun 05, 2020 REFERRING MD:  Minna Antis, CHIEF COMPLAINT:  DKA, altered mental status   Brief History:  75yo male w/unknown past medical history who presented to the ED w/AMS and was found to be in DKA vs HSS with a blood sugar >600 and lactate 5.8 w/AKI and Acute blood loss anemia 2/2 GI Bleed. Respiratory arrested shortly after arrival to ICU and required emergent intubation. Found to have MRSA pneumonia and MSSA Bacteremia after further workup. TEE negative for acute findings. DKA and GI Bleed have resolved. Worsening AKI. Continues to be encephalopathic which is precluding extubation at this time.   History of Present Illness:  75yo male w/unknown past medical history who presented to the ED via EMS after the son found him unresponsive, face down in the kitchen, incontinent of urine. Per the ED, the patient son reported to them they call and check on him daily and last spoke with him yesterday. When EMS arrived, the patient was awake but became unresponsive en route to the ED.   On arrival to the ED, the patient was arousable only to painful stimuli, hypothermic 94 otherwise stable vital signs. Labs revealed a blood glucose > 600, lactate 5.8, WBC 26.2, Hgb 7.1 and VBG: 7.19/36 and bicarb 13. He was given 1L LR bolus, started on endotool and antibiotics were initiated. Rapid COVID test negative  ICU was called for admission. Initially patient was able to answer orientation questions but confused to situation. Shortly after arrival to ICU from ED, the patient became unresponsive, brady cardic and PEA arrest briefly requiring emergent intubation. Repeat CTH negative for acute findings.   05/14/20 - patient with MSSA bacteremia, critially ill on mechanical ventilator.  Remains on vasopressor. Insulin gtt is off today.  Active GI bleed s/p PRBC transfusion. 05/15/20- patient remains  hypotensive on MV.  Possible EGD when more stable - spoke with GI- Dr Lacretia Nicks today. Still with edema 2+ on vasopressors.  05/16/20- patient on MV.  A line not functioning will dc today.  Pt in sinus rhythm remains intubated septic awaiting tee for endocarditis workup.  05/11/2020 - TEE performed at bedside. Nephrology consulted for worsening renal function. 05/18/20- Remains encephalopathic on vent. Hyperglycemic overnight. D5 stopped for hypernatremia stopped and FWF flushes started  Past Medical History:  Unknown  Significant Hospital Events:  1/13-Admitted to ICU 1/13- Resipatory arrest. Intubated  Consults:  GI Cardiology ID  Procedures:  ETT 1/13>> A line 1/13 - 1/16 CVL 1/13>>  Significant Diagnostic Tests:  CT head 05/13/20 Stable non contrast CT appearance of the brain. No CT evidence of anoxic injury. 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  TEE 05/18/2020 EF 40-50% RV systolic function normal No evidence of vegetation/infective endocarditis  Micro Data:  Bld Cx 06-05-20 - MSSA Bld Cx 05/14/20 - Negative  Tracheal Aspirate 05/14/20 - MRSA  Antimicrobials:  Vancomycin 1/12- d/c Cefepime 1/12- d/c  Zosyn 1/13- d/c  Zyvox 1/17>> Cefazolin 1/13>> Flagyl 1/14>>  Interim History / Subjective:  Tmax 38.1 I&Os: +2L  Overnight hyperglycemic 2/2 D5 infusion for hypernatremia. Transitioned to [facility]. Insulin regimen ordered Remains on the ventilator with no sedation. GCS 3T. Hemodynamically stable.   Objective   Blood pressure 136/65, pulse 90, temperature 99.68 F (37.6 C), temperature source Esophageal, resp. rate 19, height 5\' 8"  (1.727 m), weight 111.9 kg, SpO2 93 %.    Vent Mode: PRVC FiO2 (%):  [  28 %-35 %] 30 % Set Rate:  [16 bmp] 16 bmp Vt Set:  [500 mL] 500 mL PEEP:  [5 cmH20] 5 cmH20   Intake/Output Summary (Last 24 hours) at 05/18/2020 0942 Last data filed at 05/18/2020 0636 Gross per 24 hour  Intake 3058.31 ml  Output 1000 ml  Net 2058.31  ml   Filed Weights   05/14/20 0342 05/16/20 0500 05/18/20 0500  Weight: 110.1 kg 109.6 kg 111.9 kg    Examination: General: intubated, sedated, no acute distress HENT: [place]/AT, PERRL, sclera anicteric Lungs: Clear to auscultation Cardiovascular: RRR, s1s2, no murmur Abdomen: soft, non-distended, BS+ Extremities: trace edema, warm Neuro: sedated GU: Foley catheter in place  Resolved Hospital Problem list   DKA  Assessment & Plan:   Acute Hypoxemic Respiratory Failure MRSA Pneumonia - Mental status precludes extubation. Not on sedation - Tracheal aspirate from 05/14/20 has grown MRSA and he continues to have purulent appearing secretions - Per note from 1/17 recommendation is to continue Zyvox x7 days. Started 1/17 and to discontinue flagyl. Flagyl still on board. Pharmacy to discuss w/ID today about discontinuing flagyl as discussed in rounds  - VAP Bundle  Shock 2/2 Cardiac Arrest- Resolved  Acute Kidney Injury, non-oliguric Hypernatremia - Unknown baseline creatinine and renal function worsening since admission - Nephrology consulted and discussed case w/nephrologist this AM. Continue to monitor for now. No recommendation for dialysis at this time. Cr slightly improved today to 4.98 from 5.06 - Kidney ultrasound pending - Will hold off on further diuresis today. Received Lasix and metolazone yesterday  MSSA Bacteremia - ID following, treating with cefazolin - TEE negative for vegetations  Acute Blood Loss Anemia - Last transfusion 05/14/20 - GI not planning to scope  - Continue PPI BID   Diabetes Mellitus  - SSI q4hrs - Levemir 34 units BID - Hyperglycemic overnight attributed to D5 infusion which was stopped - Blood sugars remain >300 - Monitor throughout today and increase Levemir if no improvement   Acute Encephalopathy - Etiology unclear suspect toxic metabolic in the setting of worsening AKI - Currently GCS 3T on no sedation - CT scan 1/12 and 1/13 with no  acute findings - Sedation stopped yesterday - Unable to obtain Brain MRI due to pacemaker - Consult neurology in AM if no improvement  Best practice (evaluated daily)  Diet: TF Pain/Anxiety/Delirium protocol (if indicated): none, GCS 3T VAP protocol (if indicated): ordered DVT prophylaxis: scds, discuss w/GI when okay to start chemical prophylaxis GI prophylaxis: Pantoprazole BID Glucose control: SSI, levemir 34 units BID Mobility: bed rest Disposition:ICU Code Status: Full  Labs   CBC: Recent Labs  Lab 05/18/2020 2222 05/13/20 0026 05/14/20 2104 05/15/20 0558 05/16/20 0551 05/19/2020 0326 05/18/20 0407  WBC 26.2*   < > 13.6* 14.6* 15.6* 12.8* 9.7  NEUTROABS 21.6*  --  10.8*  --   --   --   --   HGB 7.1*   < > 9.3* 9.4* 9.6* 8.4* 8.1*  HCT 22.0*   < > 26.9* 27.4* 28.5* 26.6* 25.6*  MCV 99.5   < > 88.8 89.3 91.3 94.0 93.8  PLT 227   < > 108* 104* 110* 123* 171   < > = values in this interval not displayed.    Basic Metabolic Panel: Recent Labs  Lab 05/14/20 0333 05/15/20 0558 05/16/20 0551 05/28/2020 0326 05/15/2020 1652 05/18/20 0407  NA 147* 151* 154* 156*  --  149*  K 3.9 3.9 3.4* 3.6  --  3.6  CL  111 117* 119* 120*  --  116*  CO2 22 24 25 25   --  23  GLUCOSE 289* 198* 103* 178*  --  367*  BUN 119* 112* 99* 96*  --  96*  CREATININE 4.46* 4.69* 4.89* 5.04*  --  4.93*  CALCIUM 8.0* 8.3* 8.2* 7.7*  --  7.5*  MG 1.9 1.8 1.9 1.9  --  2.1  PHOS 3.0 2.8 2.3* 3.3 3.1 3.7   GFR: Estimated Creatinine Clearance: 16 mL/min (A) (by C-G formula based on SCr of 4.93 mg/dL (H)). Recent Labs  Lab 05/30/2020 2222 05/13/20 0026 05/13/20 0218 05/13/20 0548 05/13/20 1000 05/15/20 0558 05/16/20 0551 05/16/2020 0326 05/18/20 0407  WBC 26.2* 21.4* 29.4* 27.2*   < > 14.6* 15.6* 12.8* 9.7  LATICACIDVEN 5.8* 2.8* 3.4* 2.0*  --   --   --   --   --    < > = values in this interval not displayed.    Liver Function Tests: Recent Labs  Lab 05/14/20 0333 05/15/20 0558  05/16/20 0551 05/23/2020 0326 05/18/20 0407  AST 21 20 16 15  12*  ALT 10 6 <5 <5 <5  ALKPHOS 46 48 60 58 65  BILITOT 0.9 0.5 0.5 0.6 0.5  PROT 4.8* 4.8* 5.2* 5.1* 5.1*  ALBUMIN 2.1* 1.9* 1.8* 1.7* 1.6*   No results for input(s): LIPASE, AMYLASE in the last 168 hours. No results for input(s): AMMONIA in the last 168 hours.  ABG    Component Value Date/Time   PHART 7.40 05/14/2020 0333   PCO2ART 32 05/14/2020 0333   PO2ART 87 05/14/2020 0333   HCO3 19.8 (L) 05/14/2020 0333   ACIDBASEDEF 4.6 (H) 05/14/2020 0333   O2SAT 96.6 05/14/2020 0333     Coagulation Profile: Recent Labs  Lab 05/11/2020 2222 05/13/20 0218  INR 1.4* 1.5*    Cardiac Enzymes: Recent Labs  Lab 05/10/2020 2222  CKTOTAL 149    HbA1C: Hgb A1c MFr Bld  Date/Time Value Ref Range Status  05/13/2020 02:49 PM 10.2 (H) 4.8 - 5.6 % Final    Comment:    (NOTE) Pre diabetes:          5.7%-6.4%  Diabetes:              >6.4%  Glycemic control for   <7.0% adults with diabetes     CBG: Recent Labs  Lab 05/27/2020 1601 05/25/2020 1917 05/10/2020 2309 05/18/20 0303 05/18/20 0722  GLUCAP 250* 296* 324* 323* 314*     Critical care time: 51 minutes

## 2020-05-18 NOTE — Progress Notes (Signed)
ID    Date of Admission:  05/24/2020     LDA Triple-lumen placed on 05/13/2020 right internal jugular Urethral catheter placed on 05/07/2020. OG tube placed on 05/18/2020. Endotracheal tube placed on 05/13/2020.      Medications:  . chlorhexidine gluconate (MEDLINE KIT)  15 mL Mouth Rinse BID  . Chlorhexidine Gluconate Cloth  6 each Topical Q0600  . feeding supplement (PROSource TF)  90 mL Per Tube TID  . free water  200 mL Per Tube Q4H  . insulin aspart  0-15 Units Subcutaneous Q4H  . insulin aspart  3 Units Subcutaneous Q4H  . insulin detemir  34 Units Subcutaneous Q12H  . mouth rinse  15 mL Mouth Rinse 10 times per day  . multivitamin with minerals  1 tablet Per Tube Daily  . mupirocin ointment  1 application Nasal BID  . pantoprazole (PROTONIX) IV  40 mg Intravenous Q12H    Objective: Vital signs in last 24 hours: Temp:  [99.14 F (37.3 C)-100.58 F (38.1 C)] 99.32 F (37.4 C) (01/18 1000) Pulse Rate:  [90-99] 90 (01/18 1000) Resp:  [13-23] 21 (01/18 1000) BP: (126-157)/(52-68) 151/68 (01/18 1000) SpO2:  [89 %-97 %] 95 % (01/18 1000) FiO2 (%):  [28 %-35 %] 30 % (01/18 0736) Weight:  [111.9 kg] 111.9 kg (01/18 0500)  PHYSICAL EXAM:  General: remains intubated Off sedation Head: Normocephalic, without obvious abnormality, atraumatic. Eyes: Conjunctivae clear, anicteric sclerae. Pupils are small but reactive- dolls eye movt ENT cannot examine Neck:  Rt IJ line Lungs: b/l air entry. Heart: s1s2 Abdomen: Soft, non-tender,not distended. Bowel sounds normal. No masses Foley  Extremities:edema extremitiesSkin: No rashes or lesions. Or bruising Lymph: Cervical, supraclavicular normal. Neurologic: cannot assess Lab Results Recent Labs    05/28/2020 0326 05/18/20 0407  WBC 12.8* 9.7  HGB 8.4* 8.1*  HCT 26.6* 25.6*  NA 156* 149*  K 3.6 3.6  CL 120* 116*  CO2 25 23  BUN 96* 96*  CREATININE 5.04* 4.93*   Liver Panel Recent Labs    05/21/2020 0326  05/18/20 0407  PROT 5.1* 5.1*  ALBUMIN 1.7* 1.6*  AST 15 12*  ALT <5 <5  ALKPHOS 58 65  BILITOT 0.6 0.5   Sedimentation Rate No results for input(s): ESRSEDRATE in the last 72 hours. C-Reactive Protein No results for input(s): CRP in the last 72 hours.  Microbiology:  Studies/Results: DG Abd 1 View  Result Date: 05/19/2020 CLINICAL DATA:  Orogastric tube placement EXAM: ABDOMEN - 1 VIEW COMPARISON:  Portable exam 1105 hours compared to 05/13/2020 FINDINGS: Tip of orogastric tube projects over mid stomach. Nonobstructive bowel gas pattern. No acute osseous findings. IMPRESSION: Tip of orogastric tube projects over mid stomach. Electronically Signed   By: Lavonia Dana M.D.   On: 05/22/2020 11:21   US RENAL  Result Date: 05/18/2020 CLINICAL DATA:  Acute kidney injury EXAM: RENAL / URINARY TRACT ULTRASOUND COMPLETE COMPARISON:  None. FINDINGS: Right Kidney: Renal measurements: 10.9 x 6.3 x 5.8 cm = volume: 206 mL. Mild diffuse thinning of renal cortex. No mass. No hydronephrosis. Left Kidney: Renal measurements: 10.9 x 5.3 x 4.5 cm = volume: 135 mL. Mild diffuse thinning of renal cortex. No hydronephrosis. 2.3 cm simple cyst seen in the mid left kidney. Bladder: Limited evaluation due to collapse configuration.  Foley in place. Other: None. IMPRESSION: Mild diffuse thinning of renal cortex consistent with chronic medical renal disease. Electronically Signed   By: Miachel Roux M.D.   On: 05/18/2020 10:32  DG Chest Port 1 View  Result Date: 05/01/2020 CLINICAL DATA:  Acute respiratory failure EXAM: PORTABLE CHEST 1 VIEW COMPARISON:  May 15, 2020 FINDINGS: Evaluation is limited by patient positioning. Portions of the lower thorax were not visualized on this study. The support devices appear essentially stable. There are persistent bilateral airspace opacities with a dense retrocardiac opacity. There is no pneumothorax. No definite acute osseous abnormality. IMPRESSION: No significant interval  change. Electronically Signed   By: Constance Holster M.D.   On: 05/14/2020 04:28   ECHO TEE  Result Date: 05/11/2020    TRANSESOPHOGEAL ECHO REPORT   Patient Name:   Billy Barton Date of Exam: 05/07/2020 Medical Rec #:  [id number]    Height:       68.0 in Accession #:    [id number]   Weight:       241.6 lb Date of Birth:  [birth year]    BSA:          2.215 m Patient Age:    74 years     BP:           123/63 mmHg Patient Gender: M            HR:           92 bpm. Exam Location:  [facility] Procedure: Transesophageal Echo, Color Doppler and Cardiac Doppler Indications:     Not listed on TEE check-in  History:         Patient has prior history of Echocardiogram examinations, most                  recent 05/14/2020. CAD; Risk Factors:Diabetes. AICD.  Sonographer:     Sherrie Sport RDCS (AE) Referring Phys:  [id number] [place] Diagnosing Phys: Kathlyn Sacramento MD PROCEDURE: The transesophogeal probe was passed without difficulty through the esophogus of the patient. Sedation performed by performing physician. Image quality was good. The patient's vital signs; including heart rate, blood pressure, and oxygen saturation; remained stable throughout the procedure. The patient developed no complications during the procedure. IMPRESSIONS  1. Left ventricular ejection fraction, by estimation, is 45 to 50%. The left ventricle has mildly decreased function. Left ventricular endocardial border not optimally defined to evaluate regional wall motion.  2. Right ventricular systolic function is normal. The right ventricular size is normal. Tricuspid regurgitation signal is inadequate for assessing PA pressure.  3. No left atrial/left atrial appendage thrombus was detected.  4. The mitral valve is normal in structure. Mild mitral valve regurgitation. No evidence of mitral stenosis.  5. The aortic valve is normal in structure. Aortic valve regurgitation is not visualized. No aortic stenosis is present. Conclusion(s)/Recommendation(s):  No evidence of vegetation/infective endocarditis on this transesophageal echocardiogram. FINDINGS  Left Ventricle: Left ventricular ejection fraction, by estimation, is 45 to 50%. The left ventricle has mildly decreased function. Left ventricular endocardial border not optimally defined to evaluate regional wall motion. The left ventricular internal cavity size was normal in size. There is no left ventricular hypertrophy. Right Ventricle: The right ventricular size is normal. No increase in right ventricular wall thickness. Right ventricular systolic function is normal. Tricuspid regurgitation signal is inadequate for assessing PA pressure. Left Atrium: Left atrial size was normal in size. No left atrial/left atrial appendage thrombus was detected. Right Atrium: Right atrial size was normal in size. Pericardium: There is no evidence of pericardial effusion. Mitral Valve: The mitral valve is normal in structure. Mild mitral valve regurgitation. No evidence of mitral valve stenosis. Tricuspid  Valve: The tricuspid valve is normal in structure. Tricuspid valve regurgitation is mild . No evidence of tricuspid stenosis. Aortic Valve: The aortic valve is normal in structure. Aortic valve regurgitation is not visualized. No aortic stenosis is present. Pulmonic Valve: The pulmonic valve was normal in structure. Pulmonic valve regurgitation is not visualized. No evidence of pulmonic stenosis. Aorta: The aortic root is normal in size and structure. Venous: The inferior vena cava was not well visualized. IAS/Shunts: No atrial level shunt detected by color flow Doppler. Kathlyn Sacramento MD Electronically signed by Kathlyn Sacramento MD Signature Date/Time: 05/11/2020/10:29:26 AM    Final      Assessment/Plan:  Acute encephalopathy/altered mental status on presentation due to DKA with hyponatremia. CT head no acute findings.  Recommend MRI/MRV to look for any severe superior sagittal vein thrombosis or infarct.  Uncontrolled  diabetes mellitus presented with DKA, hyperkalemia and hyponatremia.  Corrected now Cardiac arrest leading to resuscitation and intubation   Staph aureus bacteremia 1 out of 2 sets. Proteus bacteremia 1of 4 Patient has pacemaker.  There is no skin source.  Concern for deep infection.  TEE negative.  Repeat blood culture from 05/14/2020 negative.  Need to get records from [place] to see why the pacemaker was placed.  May be necessary to remove the pacemaker    MRSA pneumonia Patient started on linezolid.  Acute kidney injury with unknown baseline creatinine level.  Followed by renal.  He is not oliguric.   Anemia Patient had black tarry stools.  No further episodes in the past 3 days.  Last blood transfusion was 05/14/2018.  Seen by GI and the recommendation is no  indication for endoscopic intervention currently..  Discussed the management with the care team

## 2020-05-18 NOTE — Consult Note (Signed)
[facility] CONSULT NOTE    Date: 05/18/2020                  Patient Name:  Billy Barton  MRN: [id number]  DOB: 12/12/1945  Age / Sex: 75 y.o., male         PCP: Patient, No Pcp Per                 Service Requesting Consult:  Critical care                 Reason for Consult:  Acute kidney injury            History of Present Illness: Patient is a 75 y.o. male with past medical history of coronary artery disease, diabetes mellitus type 2, history of AICD placement who was admitted to [facility] on 05/02/2020 for evaluation of altered mental status.  He was admitted on 05/05/2020 with DKA and altered mental status.  His blood sugar was greater than 600 upon admission with an elevated lactate level.  When he arrived here he was only arousable to painful stimuli.  He was found to have MSSA bacteremia as well as acute respiratory failure.  He was placed on on the ventilator.  Patient was hypotensive on mechanical ventilation subsequently.  We are now asked to see him for acute kidney injury.  His baseline creatinine is currently unknown.  Upon presentation his BUN was 162 with a creatinine of 4.34.  BUN is trended down to 96 however creatinine still high at 4.93 with an EGFR of 12.  Urine output was 1 L over the preceding 24 hours.   Medications: Outpatient medications: No medications prior to admission.    Current medications: Current Facility-Administered Medications  Medication Dose Route Frequency Provider Last Rate Last Admin  . 0.9 %  sodium chloride infusion  10 mL/hr Intravenous Once Minna Antis, MD   Held at 05/13/20 0308  . 0.9 %  sodium chloride infusion   Intravenous PRN Rust-Chester, Cecelia Byars, NP   Paused at 05/18/20 0513  . acetaminophen (TYLENOL) suppository 650 mg  650 mg Rectal Q4H PRN [name], MD   650 mg at 05/15/20 0609  . ceFAZolin (ANCEF) IVPB 2g/100 mL premix  2 g Intravenous Q12H Sharen Hones, RPH 200 mL/hr at 05/18/20 0735 2 g at  05/18/20 0735  . chlorhexidine gluconate (MEDLINE KIT) (PERIDEX) 0.12 % solution 15 mL  15 mL Mouth Rinse BID [name], MD   15 mL at 05/18/20 0737  . Chlorhexidine Gluconate Cloth 2 % PADS 6 each  6 each Topical Q0600 Vida Rigger, MD   6 each at 05/18/20 0400  . dexmedetomidine (PRECEDEX) 400 MCG/100ML (4 mcg/mL) infusion  0.4-1.2 mcg/kg/hr Intravenous Titrated Melody Comas B, MD      . dextrose 50 % solution 0-50 mL  0-50 mL Intravenous PRN Minna Antis, MD   25 mL at 05/16/20 1103  . docusate (COLACE) 50 MG/5ML liquid 100 mg  100 mg Per Tube BID PRN Martina Sinner, MD      . feeding supplement (PROSource TF) liquid 90 mL  90 mL Per Tube TID Martina Sinner, MD   90 mL at 05/01/2020 2113  . feeding supplement (VITAL AF 1.2 CAL) liquid 1,000 mL  1,000 mL Per Tube Continuous Martina Sinner, MD 45 mL/hr at 05/08/2020 1353 1,000 mL at 05/01/2020 1353  . fentaNYL in NS (58mcg/ml) infusion-PREMIX  0-400 mcg/hr Intravenous Continuous Fayrene Fearing,  Colletta Maryland, NP   Stopped at 05/15/2020 1340  . free water 200 mL  200 mL Per Tube Q4H Darel Hong D, NP   200 mL at 05/18/20 0738  . insulin aspart (novoLOG) injection 0-15 Units  0-15 Units Subcutaneous Q4H Bradly Bienenstock, NP   11 Units at 05/18/20 [id number]  . insulin aspart (novoLOG) injection 3 Units  3 Units Subcutaneous Q4H Darel Hong D, NP   3 Units at 05/18/20 0736  . insulin detemir (LEVEMIR) injection 34 Units  34 Units Subcutaneous Q12H Awilda Bill, NP   34 Units at 05/05/2020 2117  . linezolid (ZYVOX) IVPB 600 mg  600 mg Intravenous Q12H Freddi Starr, MD   Stopped at 05/18/20 0005  . MEDLINE mouth rinse  15 mL Mouth Rinse 10 times per day Flora Lipps, MD   15 mL at 05/18/20 0516  . metroNIDAZOLE (FLAGYL) IVPB 500 mg  500 mg Intravenous Q8H Ravishankar, Joellyn Quails, MD 100 mL/hr at 05/18/20 0600 Infusion Verify at 05/18/20 0600  . multivitamin with minerals tablet 1 tablet  1 tablet Per Tube Daily Freddi Starr, MD      . mupirocin ointment (BACTROBAN) 2 % 1 application  1 application Nasal BID Rust-Chester, Huel Cote, NP   1 application at 30/16/01 2117  . pantoprazole (PROTONIX) injection 40 mg  40 mg Intravenous Violeta Gelinas, NP   40 mg at 05/16/2020 2116  . polyethylene glycol (MIRALAX / GLYCOLAX) packet 17 g  17 g Per Tube Daily PRN Freddi Starr, MD          Allergies: Not on File    Past Medical History: Past Medical History:  Diagnosis Date  . AICD (automatic cardioverter/defibrillator) present    a. 03/23/2015 s/p MDT DC AICD - placed @ [place], [place].  Marland Kitchen CAD (coronary artery disease)    a. report of MI - unclear of procedures performed.  . Insulin dependent type 2 diabetes mellitus ([place])      Past Surgical History: History reviewed. No pertinent surgical history.   Family History: History reviewed. No pertinent family history.   Social History: Social History   Socioeconomic History  . Marital status: Widowed    Spouse name: Not on file  . Number of children: Not on file  . Years of education: Not on file  . Highest education level: Not on file  Occupational History  . Not on file  Tobacco Use  . Smoking status: Unknown If Ever Smoked  . Smokeless tobacco: Not on file  Substance and Sexual Activity  . Alcohol use: Not Currently  . Drug use: Not on file  . Sexual activity: Not on file  Other Topics Concern  . Not on file  Social History Narrative   Was living in [place] - homeless.  Son moved him up to [place] ~ 2-3 yrs ago.  Pt lives by himself but son nearby and w/ close/frequent contact.   Social Determinants of Health   Financial Resource Strain: Not on file  Food Insecurity: Not on file  Transportation Needs: Not on file  Physical Activity: Not on file  Stress: Not on file  Social Connections: Not on file  Intimate Partner Violence: Not on file     Review of Systems: Patient unable to provide as he  is currently on the ventilator  Vital Signs: Blood pressure 136/65, pulse 90, temperature 99.68 F (37.6 C), temperature source Esophageal, resp. rate 19, height $RemoveBe'5\' 8"'[id number]$  (1.727 m),  weight 111.9 kg, SpO2 93 %.  Weight trends: Filed Weights   05/14/20 0342 05/16/20 0500 05/18/20 0500  Weight: 110.1 kg 109.6 kg 111.9 kg     Physical Exam: General:  Critically ill-appearing  Head:  Endotracheal tube in place  Eyes:  Anicteric  Neck:  Supple  Lungs:   Scattered rhonchi, vent assisted  Heart:  S1S2 no rubs  Abdomen:   Soft, nontender, bowel sounds present  Extremities:  1+ peripheral edema.  Neurologic:  Intubated, not following commands  Skin:  No acute rashes  Access:  No hemodialysis access    Lab results: Basic Metabolic Panel: Recent Labs  Lab 05/14/20 0333 05/15/20 0558 05/16/20 0551 05/19/2020 0326 05/25/2020 1652 05/18/20 0407  NA 147* 151* 154* 156*  --  149*  K 3.9 3.9 3.4* 3.6  --  3.6  CL 111 117* 119* 120*  --  116*  CO2 $Re'22 24 25 25  'KYH$ --  23  GLUCOSE 289* 198* 103* 178*  --  367*  BUN 119* 112* 99* 96*  --  96*  CREATININE 4.46* 4.69* 4.89* 5.04*  --  4.93*  CALCIUM 8.0* 8.3* 8.2* 7.7*  --  7.5*  MG 1.9 1.8 1.9 1.9  --  2.1  PHOS 3.0 2.8 2.3* 3.3 3.1 3.7    Liver Function Tests: Recent Labs  Lab 05/16/20 0551 05/13/2020 0326 05/18/20 0407  AST 16 15 12*  ALT <5 <5 <5  ALKPHOS 60 58 65  BILITOT 0.5 0.6 0.5  PROT 5.2* 5.1* 5.1*  ALBUMIN 1.8* 1.7* 1.6*   No results for input(s): LIPASE, AMYLASE in the last 168 hours. No results for input(s): AMMONIA in the last 168 hours.  CBC: Recent Labs  Lab 05/18/2020 2222 05/13/20 0026 05/14/20 2104 05/15/20 0558 05/16/20 0551 05/18/2020 0326 05/18/20 0407  WBC 26.2*   < > 13.6* 14.6* 15.6* 12.8* 9.7  NEUTROABS 21.6*  --  10.8*  --   --   --   --   HGB 7.1*   < > 9.3* 9.4* 9.6* 8.4* 8.1*  HCT 22.0*   < > 26.9* 27.4* 28.5* 26.6* 25.6*  MCV 99.5   < > 88.8 89.3 91.3 94.0 93.8  PLT 227   < > 108* 104* 110* 123*  171   < > = values in this interval not displayed.    Cardiac Enzymes: Recent Labs  Lab 05/15/2020 2222  CKTOTAL 149    BNP: Invalid input(s): POCBNP  CBG: Recent Labs  Lab 05/07/2020 1601 05/10/2020 1917 05/26/2020 2309 05/18/20 0303 05/18/20 0722  GLUCAP 250* 296* 324* 323* 314*    Microbiology: Results for orders placed or performed during the hospital encounter of 05/22/2020  Blood culture (routine single)     Status: Abnormal   Collection Time: 05/14/2020 10:24 PM   Specimen: BLOOD  Result Value Ref Range Status   Specimen Description   Final    BLOOD LEFT ASSIST CONTROL Performed at [facility], [address], [place], [place] [address]    Special Requests   Final    BOTTLES DRAWN AEROBIC AND ANAEROBIC Blood Culture adequate volume Performed at [facility], [address] [place], [place] [address]    Culture  Setup Time   Final    Organism ID to follow GRAM POSITIVE COCCI IN BOTH AEROBIC AND ANAEROBIC BOTTLES CRITICAL RESULT CALLED TO, READ BACK BY AND VERIFIED WITH: MYRA SLAUGHTER AT 1309 ON 05/13/20 SNG GRAM NEGATIVE RODS ANAEROBIC BOTTLE ONLY CRITICAL RESULT CALLED  TO, READ BACK BY AND VERIFIED WITH: MORGAN HICKS PHARMD $[email or web address]$  05/14/20 EB Performed at [facility], [place] [address], [place], [place] [address]    Culture STAPHYLOCOCCUS AUREUS PROTEUS MIRABILIS  (A)  Final   Report Status 05/16/2020 FINAL  Final   Organism ID, Bacteria STAPHYLOCOCCUS AUREUS  Final   Organism ID, Bacteria PROTEUS MIRABILIS  Final      Susceptibility   Proteus mirabilis - MIC*    AMPICILLIN <=2 SENSITIVE Sensitive     CEFAZOLIN <=4 SENSITIVE Sensitive     CEFEPIME <=0.12 SENSITIVE Sensitive     CEFTAZIDIME <=1 SENSITIVE Sensitive     CEFTRIAXONE <=0.25 SENSITIVE Sensitive     CIPROFLOXACIN <=0.25 SENSITIVE Sensitive     GENTAMICIN <=1 SENSITIVE Sensitive     IMIPENEM 8 INTERMEDIATE Intermediate     TRIMETH/SULFA <=20 SENSITIVE Sensitive      AMPICILLIN/SULBACTAM <=2 SENSITIVE Sensitive     PIP/TAZO <=4 SENSITIVE Sensitive     * PROTEUS MIRABILIS   Staphylococcus aureus - MIC*    CIPROFLOXACIN <=0.5 SENSITIVE Sensitive     ERYTHROMYCIN <=0.25 SENSITIVE Sensitive     GENTAMICIN <=0.5 SENSITIVE Sensitive     OXACILLIN 0.5 SENSITIVE Sensitive     TETRACYCLINE <=1 SENSITIVE Sensitive     VANCOMYCIN 1 SENSITIVE Sensitive     TRIMETH/SULFA <=10 SENSITIVE Sensitive     CLINDAMYCIN <=0.25 SENSITIVE Sensitive     RIFAMPIN <=0.5 SENSITIVE Sensitive     Inducible Clindamycin NEGATIVE Sensitive     * STAPHYLOCOCCUS AUREUS  Culture, blood (single)     Status: None   Collection Time: 05/10/2020 10:24 PM   Specimen: BLOOD  Result Value Ref Range Status   Specimen Description BLOOD LEFT HAND  Final   Special Requests   Final    BOTTLES DRAWN AEROBIC AND ANAEROBIC Blood Culture adequate volume   Culture   Final    NO GROWTH 5 DAYS Performed at [facility], [address] [place], [place] [address]    Report Status 05/03/2020 FINAL  Final  Blood Culture ID Panel (Reflexed)     Status: Abnormal   Collection Time: 05/02/2020 10:24 PM  Result Value Ref Range Status   Enterococcus faecalis NOT DETECTED NOT DETECTED Final   Enterococcus Faecium NOT DETECTED NOT DETECTED Final   Listeria monocytogenes NOT DETECTED NOT DETECTED Final   Staphylococcus species DETECTED (A) NOT DETECTED Final    Comment: CRITICAL RESULT CALLED TO, READ BACK BY AND VERIFIED WITH: MYRA SLAUGHTER AT 1309 ON 05/13/20 SNG    Staphylococcus aureus (BCID) DETECTED (A) NOT DETECTED Final    Comment: CRITICAL RESULT CALLED TO, READ BACK BY AND VERIFIED WITH: MYRA SLAUGHTER AT 1309 05/13/20 SNG    Staphylococcus epidermidis NOT DETECTED NOT DETECTED Final   Staphylococcus lugdunensis NOT DETECTED NOT DETECTED Final   Streptococcus species NOT DETECTED NOT DETECTED Final   Streptococcus agalactiae NOT DETECTED NOT DETECTED Final   Streptococcus pneumoniae NOT  DETECTED NOT DETECTED Final   Streptococcus pyogenes NOT DETECTED NOT DETECTED Final   A.calcoaceticus-baumannii NOT DETECTED NOT DETECTED Final   Bacteroides fragilis NOT DETECTED NOT DETECTED Final   Enterobacterales NOT DETECTED NOT DETECTED Final   Enterobacter cloacae complex NOT DETECTED NOT DETECTED Final   Escherichia coli NOT DETECTED NOT DETECTED Final   Klebsiella aerogenes NOT DETECTED NOT DETECTED Final   Klebsiella oxytoca NOT DETECTED NOT DETECTED Final   Klebsiella pneumoniae NOT DETECTED NOT DETECTED Final   Proteus species NOT DETECTED NOT DETECTED  Final   Salmonella species NOT DETECTED NOT DETECTED Final   Serratia marcescens NOT DETECTED NOT DETECTED Final   Haemophilus influenzae NOT DETECTED NOT DETECTED Final   Neisseria meningitidis NOT DETECTED NOT DETECTED Final   Pseudomonas aeruginosa NOT DETECTED NOT DETECTED Final   Stenotrophomonas maltophilia NOT DETECTED NOT DETECTED Final   Candida albicans NOT DETECTED NOT DETECTED Final   Candida auris NOT DETECTED NOT DETECTED Final   Candida glabrata NOT DETECTED NOT DETECTED Final   Candida krusei NOT DETECTED NOT DETECTED Final   Candida parapsilosis NOT DETECTED NOT DETECTED Final   Candida tropicalis NOT DETECTED NOT DETECTED Final   Cryptococcus neoformans/gattii NOT DETECTED NOT DETECTED Final   Meth resistant mecA/C and MREJ NOT DETECTED NOT DETECTED Final    Comment: Performed at [facility], [address] [place], [place] [address]  Urine culture     Status: None   Collection Time: 05/13/20 12:26 AM   Specimen: In/Out Cath Urine  Result Value Ref Range Status   Specimen Description   Final    IN/OUT CATH URINE Performed at [facility], [address] [place], [place] [address]    Special Requests   Final    NONE Performed at [facility], [address], [place], [place] [address]    Culture   Final    NO GROWTH Performed at [facility], [place] [address] [place], [place] [address]    Report Status 05/14/2020 FINAL  Final  Resp Panel by RT-PCR (Flu A&B, Covid) Nasopharyngeal Swab     Status: None   Collection Time: 05/13/20 12:26 AM   Specimen: Nasopharyngeal Swab; Nasopharyngeal(NP) swabs in vial transport medium  Result Value Ref Range Status   SARS Coronavirus 2 by RT PCR NEGATIVE NEGATIVE Final    Comment: (NOTE) SARS-CoV-2 target nucleic acids are NOT DETECTED.  The SARS-CoV-2 RNA is generally detectable in upper respiratory specimens during the acute phase of infection. The lowest concentration of SARS-CoV-2 viral copies this assay can detect is 138 copies/mL. A negative result does not preclude SARS-Cov-2 infection and should not be used as the sole basis for treatment or other patient management decisions. A negative result may occur with  improper specimen collection/handling, submission of specimen other than nasopharyngeal swab, presence of viral mutation(s) within the areas targeted by this assay, and inadequate number of viral copies(<138 copies/mL). A negative result must be combined with clinical observations, patient history, and epidemiological information. The expected result is Negative.  Fact Sheet for Patients:  EntrepreneurPulse.com.au  Fact Sheet for Healthcare Providers:  IncredibleEmployment.be  This test is no t yet approved or cleared by the [place] FDA and  has been authorized for detection and/or diagnosis of SARS-CoV-2 by FDA under an Emergency Use Authorization (EUA). This EUA will remain  in effect (meaning this test can be used) for the duration of the COVID-19 declaration under Section 564(b)(1) of the Act, 21 U.S.C.section 360bbb-3(b)(1), unless the authorization is terminated  or revoked sooner.       Influenza A by PCR NEGATIVE NEGATIVE Final   Influenza B by PCR NEGATIVE NEGATIVE Final    Comment: (NOTE) The Xpert Xpress SARS-CoV-2/FLU/RSV plus  assay is intended as an aid in the diagnosis of influenza from Nasopharyngeal swab specimens and should not be used as a sole basis for treatment. Nasal washings and aspirates are unacceptable for Xpert Xpress SARS-CoV-2/FLU/RSV testing.  Fact Sheet for Patients: EntrepreneurPulse.com.au  Fact Sheet for Healthcare Providers: IncredibleEmployment.be  This test is not yet approved or cleared by the [place] and has been authorized for detection and/or diagnosis of SARS-CoV-2 by FDA under an Emergency Use Authorization (EUA). This EUA will remain in effect (meaning this test can be used) for the duration of the COVID-19 declaration under Section 564(b)(1) of the Act, 21 U.S.C. section 360bbb-3(b)(1), unless the authorization is terminated or revoked.  Performed at [facility], [address] [place], [place] [address]   Culture, blood (Routine X 2) w Reflex to ID Panel     Status: None (Preliminary result)   Collection Time: 05/14/20  2:41 PM   Specimen: BLOOD  Result Value Ref Range Status   Specimen Description BLOOD RIGHT ANTECUBITAL  Final   Special Requests   Final    BOTTLES DRAWN AEROBIC AND ANAEROBIC Blood Culture adequate volume   Culture   Final    NO GROWTH 4 DAYS Performed at [facility], [address] [place], [place] [address]    Report Status PENDING  Incomplete  Culture, blood (Routine X 2) w Reflex to ID Panel     Status: None (Preliminary result)   Collection Time: 05/14/20  2:55 PM   Specimen: BLOOD  Result Value Ref Range Status   Specimen Description BLOOD BLOOD RIGHT HAND  Final   Special Requests   Final    BOTTLES DRAWN AEROBIC AND ANAEROBIC Blood Culture results may not be optimal due to an excessive volume of blood received in culture bottles   Culture   Final    NO GROWTH 4 DAYS Performed at [facility], [address] [place], [place] [address]    Report Status  PENDING  Incomplete  Culture, respiratory (non-expectorated)     Status: None   Collection Time: 05/14/20  5:34 PM   Specimen: Tracheal Aspirate; Respiratory  Result Value Ref Range Status   Specimen Description   Final    TRACHEAL ASPIRATE Performed at [facility], [address], [place], [place] [address]    Special Requests   Final    NONE Performed at [facility], [address] [place], [place] [address]    Gram Stain   Final    MODERATE WBC PRESENT, PREDOMINANTLY PMN FEW GRAM POSITIVE COCCI IN CLUSTERS Performed at [facility], [place] [address] [place],  [address]    Culture RARE METHICILLIN RESISTANT STAPHYLOCOCCUS AUREUS  Final   Report Status 05/18/2020 FINAL  Final   Organism ID, Bacteria METHICILLIN RESISTANT STAPHYLOCOCCUS AUREUS  Final      Susceptibility   Methicillin resistant staphylococcus aureus - MIC*    CIPROFLOXACIN >=8 RESISTANT Resistant     ERYTHROMYCIN >=8 RESISTANT Resistant     GENTAMICIN <=0.5 SENSITIVE Sensitive     OXACILLIN >=4 RESISTANT Resistant     TETRACYCLINE <=1 SENSITIVE Sensitive     VANCOMYCIN <=0.5 SENSITIVE Sensitive     TRIMETH/SULFA <=10 SENSITIVE Sensitive     CLINDAMYCIN >=8 RESISTANT Resistant     RIFAMPIN <=0.5 SENSITIVE Sensitive     Inducible Clindamycin NEGATIVE Sensitive     * RARE METHICILLIN RESISTANT STAPHYLOCOCCUS AUREUS  MRSA PCR Screening     Status: Abnormal   Collection Time: 05/14/20  9:04 PM   Specimen: Nasopharyngeal  Result Value Ref Range Status   MRSA by PCR POSITIVE (A) NEGATIVE Final    Comment:        The GeneXpert MRSA Assay (FDA approved for NASAL specimens only), is one component  of a comprehensive MRSA colonization surveillance program. It is not intended to diagnose MRSA infection nor to guide or monitor treatment for MRSA infections. RESULT CALLED TO, READ BACK BY AND VERIFIED WITH: MELISSA COBB AT [phone] 05/15/20.PMF Performed at [facility], [address] [place], [place] [address]     Coagulation Studies: No results for input(s): LABPROT, INR in the last 72 hours.  Urinalysis: No results for input(s): COLORURINE, LABSPEC, PHURINE, GLUCOSEU, HGBUR, BILIRUBINUR, KETONESUR, PROTEINUR, UROBILINOGEN, NITRITE, LEUKOCYTESUR in the last 72 hours.  Invalid input(s): APPERANCEUR    Imaging: DG Abd 1 View  Result Date: 05/28/2020 CLINICAL DATA:  Orogastric tube placement EXAM: ABDOMEN - 1 VIEW COMPARISON:  Portable exam 1105 hours compared to 05/13/2020 FINDINGS: Tip of orogastric tube projects over mid stomach. Nonobstructive bowel gas pattern. No acute osseous findings. IMPRESSION: Tip of orogastric tube projects over mid stomach. Electronically Signed   By: Lavonia Dana M.D.   On: 05/16/2020 11:21   DG Chest Port 1 View  Result Date: 05/05/2020 CLINICAL DATA:  Acute respiratory failure EXAM: PORTABLE CHEST 1 VIEW COMPARISON:  May 15, 2020 FINDINGS: Evaluation is limited by patient positioning. Portions of the lower thorax were not visualized on this study. The support devices appear essentially stable. There are persistent bilateral airspace opacities with a dense retrocardiac opacity. There is no pneumothorax. No definite acute osseous abnormality. IMPRESSION: No significant interval change. Electronically Signed   By: Constance Holster M.D.   On: 05/29/2020 04:28   ECHO TEE  Result Date: 05/30/2020    TRANSESOPHOGEAL ECHO REPORT   Patient Name:   Billy Barton Date of Exam: 05/19/2020 Medical Rec #:  [id number]    Height:       68.0 in Accession #:    [id number]   Weight:       241.6 lb Date of Birth:  November 21, 1945    BSA:          2.215 m Patient Age:    65 years     BP:           123/63 mmHg Patient Gender: M            HR:           92 bpm. Exam Location:  [facility] Procedure: Transesophageal Echo, Color Doppler and Cardiac Doppler Indications:     Not listed on TEE check-in  History:         Patient has prior history of Echocardiogram  examinations, most                  recent 05/14/2020. CAD; Risk Factors:Diabetes. AICD.  Sonographer:     Sherrie Sport RDCS (AE) Referring Phys:  [id number] [place] Diagnosing Phys: Kathlyn Sacramento MD PROCEDURE: The transesophogeal probe was passed without difficulty through the esophogus of the patient. Sedation performed by performing physician. Image quality was good. The patient's vital signs; including heart rate, blood pressure, and oxygen saturation; remained stable throughout the procedure. The patient developed no complications during the procedure. IMPRESSIONS  1. Left ventricular ejection fraction, by estimation, is 45 to 50%. The left ventricle has mildly decreased function. Left ventricular endocardial border not optimally defined to evaluate regional wall motion.  2. Right ventricular systolic function is normal. The right ventricular size is normal. Tricuspid regurgitation signal is inadequate for assessing PA pressure.  3. No left atrial/left atrial appendage thrombus was detected.  4. The mitral valve is normal in structure. Mild mitral valve regurgitation. No  evidence of mitral stenosis.  5. The aortic valve is normal in structure. Aortic valve regurgitation is not visualized. No aortic stenosis is present. Conclusion(s)/Recommendation(s): No evidence of vegetation/infective endocarditis on this transesophageal echocardiogram. FINDINGS  Left Ventricle: Left ventricular ejection fraction, by estimation, is 45 to 50%. The left ventricle has mildly decreased function. Left ventricular endocardial border not optimally defined to evaluate regional wall motion. The left ventricular internal cavity size was normal in size. There is no left ventricular hypertrophy. Right Ventricle: The right ventricular size is normal. No increase in right ventricular wall thickness. Right ventricular systolic function is normal. Tricuspid regurgitation signal is inadequate for assessing PA pressure. Left Atrium:  Left atrial size was normal in size. No left atrial/left atrial appendage thrombus was detected. Right Atrium: Right atrial size was normal in size. Pericardium: There is no evidence of pericardial effusion. Mitral Valve: The mitral valve is normal in structure. Mild mitral valve regurgitation. No evidence of mitral valve stenosis. Tricuspid Valve: The tricuspid valve is normal in structure. Tricuspid valve regurgitation is mild . No evidence of tricuspid stenosis. Aortic Valve: The aortic valve is normal in structure. Aortic valve regurgitation is not visualized. No aortic stenosis is present. Pulmonic Valve: The pulmonic valve was normal in structure. Pulmonic valve regurgitation is not visualized. No evidence of pulmonic stenosis. Aorta: The aortic root is normal in size and structure. Venous: The inferior vena cava was not well visualized. IAS/Shunts: No atrial level shunt detected by color flow Doppler. Kathlyn Sacramento MD Electronically signed by Kathlyn Sacramento MD Signature Date/Time: 05/29/2020/10:29:26 AM    Final       Assessment & Plan: Pt is a 75 y.o. male with past medical history of coronary artery disease, diabetes mellitus type 2, history of AICD placement who was admitted to [facility] on 05/07/2020 for evaluation of altered mental status.  He was admitted on 05/30/2020 with DKA and altered mental status.  1.  Acute kidney injury, baseline creatinine currently unknown. 2.  Hypernatremia. 3.  Acute respiratory failure. 4.  MSSA bacteremia.  Plan: We are asked to see the patient for evaluation management of acute kidney injury.  Baseline creatinine currently unknown.  Patient does have urine output of 1 L over the preceding 24 hours.  Will need for dialysis.  Okay to continue diuresis at this time.  We will proceed with additional work-up including renal ultrasound, SPEP, UPEP, ANA, ANCA antibodies, GBM antibodies.  If renal function deteriorates we will need to consider renal placement therapy in  the form of CRRT versus hemodialysis.  Overall prognosis guarded at the moment.  Further plan as patient progresses.  Thanks for consultation.

## 2020-05-18 NOTE — Progress Notes (Signed)
Patients blood sugars remained >300. Increased Levemir to 38units BID from 36 units and scheduled insulin coverage for tube feeds from 3 to 6 units.    Amanda Cockayne ACNP-BC

## 2020-05-18 NOTE — Progress Notes (Signed)
Pt's glucose this morning noted to be 367 on BMP.  Pt was started on D5 @ 75 ml/hr yesterday for Hypernatremia (Na 156).  Na this morning improved to 149.  Given severe hyperglycemia, will d/c D5W, and place on moderate scale SSI, along with 3 units Novolog scheduled q4h for tube feeding coverage.  Will start free water flushes 200 ml q4h for hypernatremia.     Harlon Ditty, AGACNP-BC [facility] Pager: [phone]

## 2020-05-18 NOTE — Progress Notes (Signed)
Progress Note  Patient Name: Billy Barton Date of Encounter: 05/18/2020  [facility] Cardiologist: New  Subjective   Intubated. Sedation stopped. Patient was hyperglycemic overnight.  Patient remains in [place]. Nephrology saw for worsening creatinine.   Inpatient Medications    Scheduled Meds: . chlorhexidine gluconate (MEDLINE KIT)  15 mL Mouth Rinse BID  . Chlorhexidine Gluconate Cloth  6 each Topical Q0600  . feeding supplement (PROSource TF)  90 mL Per Tube TID  . free water  200 mL Per Tube Q4H  . insulin aspart  0-15 Units Subcutaneous Q4H  . insulin aspart  3 Units Subcutaneous Q4H  . insulin detemir  34 Units Subcutaneous Q12H  . mouth rinse  15 mL Mouth Rinse 10 times per day  . multivitamin with minerals  1 tablet Per Tube Daily  . mupirocin ointment  1 application Nasal BID  . pantoprazole (PROTONIX) IV  40 mg Intravenous Q12H   Continuous Infusions: . sodium chloride Stopped (05/13/20 0308)  . sodium chloride Stopped (05/18/20 0513)  .  ceFAZolin (ANCEF) IV 2 g (05/18/20 0735)  . dexmedetomidine (PRECEDEX) IV infusion    . feeding supplement (VITAL AF 1.2 CAL) 1,000 mL (05/19/2020 1353)  . fentaNYL infusion INTRAVENOUS Stopped (05/20/2020 1340)  . linezolid (ZYVOX) IV 600 mg (05/18/20 1020)  . metronidazole 100 mL/hr at 05/18/20 0600   PRN Meds: sodium chloride, acetaminophen, dextrose, docusate, polyethylene glycol   Vital Signs    Vitals:   05/18/20 0400 05/18/20 0500 05/18/20 0600 05/18/20 0700  BP: (!) 142/63 (!) 143/63 136/65   Pulse: 94 92 90   Resp: _0 Temp: (!) 100.4 F (38 C) 100.04 F (37.8 C) 99.68 F (37.6 C)   TempSrc: Esophageal Esophageal Esophageal Esophageal  SpO2: 96% 92% 93%   Weight:  111.9 kg    Height:        Intake/Output Summary (Last 24 hours) at 05/18/2020 1028 Last data filed at 05/18/2020 0636 Gross per 24 hour  Intake 3058.31 ml  Output 1000 ml  Net 2058.31 ml   Last 3 Weights 05/18/2020 05/16/2020 05/14/2020   Weight (lbs) 246 lb 11.1 oz 241 lb 10 oz 242 lb 11.6 oz  Weight (kg) 111.9 kg 109.6 kg 110.1 kg      Telemetry    SR, HR 90s, PVCs - Personally Reviewed  ECG    No new - Personally Reviewed  Physical Exam   GEN: Intubated  Neck: No JVD Cardiac: RRR, no murmurs, rubs, or gallops.  Respiratory: course breath sounds GI: Soft, nontender, non-distended  MS: minimal edema; No deformity. Neuro:  Nonfocal  Psych: Normal affect   Labs    High Sensitivity Troponin:   Recent Labs  Lab 05/29/2020 2222 05/13/20 0026 05/13/20 0218 05/13/20 0548  TROPONINIHS 50* 47* 52* 67*      Chemistry Recent Labs  Lab 05/16/20 0551 05/03/2020 0326 05/18/20 0407  NA 154* 156* 149*  K 3.4* 3.6 3.6  CL 119* 120* 116*  CO2 _1 GLUCOSE 103* 178* 367*  BUN 99* 96* 96*  CREATININE 4.89* 5.04* 4.93*  CALCIUM 8.2* 7.7* 7.5*  PROT 5.2* 5.1* 5.1*  ALBUMIN 1.8* 1.7* 1.6*  AST 16 15 12*  ALT <5 <5 <5  ALKPHOS 60 58 65  BILITOT 0.5 0.6 0.5  GFRNONAA 12* 11* 12*  ANIONGAP _2 Hematology Recent Labs  Lab 05/16/20 0551 05/26/2020 0326 05/18/20 0407  WBC 15.6* 12.8* 9.7  RBC 3.12* 2.83* 2.73*  HGB 9.6* 8.4* 8.1*  HCT 28.5* 26.6* 25.6*  MCV 91.3 94.0 93.8  MCH 30.8 29.7 29.7  MCHC 33.7 31.6 31.6  RDW 20.7* 20.5* 19.9*  PLT 110* 123* 171    BNPNo results for input(s): BNP, PROBNP in the last 168 hours.   DDimer No results for input(s): DDIMER in the last 168 hours.   Radiology    DG Abd 1 View  Result Date: 05/20/2020 CLINICAL DATA:  Orogastric tube placement EXAM: ABDOMEN - 1 VIEW COMPARISON:  Portable exam 1105 hours compared to 05/13/2020 FINDINGS: Tip of orogastric tube projects over mid stomach. Nonobstructive bowel gas pattern. No acute osseous findings. IMPRESSION: Tip of orogastric tube projects over mid stomach. Electronically Signed   By: Lavonia Dana M.D.   On: 05/22/2020 11:21   DG Chest Port 1 View  Result Date: 05/03/2020 CLINICAL DATA:  Acute  respiratory failure EXAM: PORTABLE CHEST 1 VIEW COMPARISON:  May 15, 2020 FINDINGS: Evaluation is limited by patient positioning. Portions of the lower thorax were not visualized on this study. The support devices appear essentially stable. There are persistent bilateral airspace opacities with a dense retrocardiac opacity. There is no pneumothorax. No definite acute osseous abnormality. IMPRESSION: No significant interval change. Electronically Signed   By: Constance Holster M.D.   On: 05/19/2020 04:28   ECHO TEE  Result Date: 05/07/2020    TRANSESOPHOGEAL ECHO REPORT   Patient Name:   Billy Barton Date of Exam: 05/19/2020 Medical Rec #:  [id number]    Height:       68.0 in Accession #:    [id number]   Weight:       241.6 lb Date of Birth:  Oct 08, 1945    BSA:          2.215 m Patient Age:    75 years     BP:           123/63 mmHg Patient Gender: M            HR:           92 bpm. Exam Location:  [facility] Procedure: Transesophageal Echo, Color Doppler and Cardiac Doppler Indications:     Not listed on TEE check-in  History:         Patient has prior history of Echocardiogram examinations, most                  recent 05/14/2020. CAD; Risk Factors:Diabetes. AICD.  Sonographer:     Sherrie Sport RDCS (AE) Referring Phys:  [id number] [place] Diagnosing Phys: Kathlyn Sacramento MD PROCEDURE: The transesophogeal probe was passed without difficulty through the esophogus of the patient. Sedation performed by performing physician. Image quality was good. The patient's vital signs; including heart rate, blood pressure, and oxygen saturation; remained stable throughout the procedure. The patient developed no complications during the procedure. IMPRESSIONS  1. Left ventricular ejection fraction, by estimation, is 45 to 50%. The left ventricle has mildly decreased function. Left ventricular endocardial border not optimally defined to evaluate regional wall motion.  2. Right ventricular systolic function is normal. The  right ventricular size is normal. Tricuspid regurgitation signal is inadequate for assessing PA pressure.  3. No left atrial/left atrial appendage thrombus was detected.  4. The mitral valve is normal in structure. Mild mitral valve regurgitation. No evidence of mitral stenosis.  5. The aortic valve is normal in structure. Aortic valve regurgitation is not visualized. No aortic stenosis is present. Conclusion(s)/Recommendation(s):  No evidence of vegetation/infective endocarditis on this transesophageal echocardiogram. FINDINGS  Left Ventricle: Left ventricular ejection fraction, by estimation, is 45 to 50%. The left ventricle has mildly decreased function. Left ventricular endocardial border not optimally defined to evaluate regional wall motion. The left ventricular internal cavity size was normal in size. There is no left ventricular hypertrophy. Right Ventricle: The right ventricular size is normal. No increase in right ventricular wall thickness. Right ventricular systolic function is normal. Tricuspid regurgitation signal is inadequate for assessing PA pressure. Left Atrium: Left atrial size was normal in size. No left atrial/left atrial appendage thrombus was detected. Right Atrium: Right atrial size was normal in size. Pericardium: There is no evidence of pericardial effusion. Mitral Valve: The mitral valve is normal in structure. Mild mitral valve regurgitation. No evidence of mitral valve stenosis. Tricuspid Valve: The tricuspid valve is normal in structure. Tricuspid valve regurgitation is mild . No evidence of tricuspid stenosis. Aortic Valve: The aortic valve is normal in structure. Aortic valve regurgitation is not visualized. No aortic stenosis is present. Pulmonic Valve: The pulmonic valve was normal in structure. Pulmonic valve regurgitation is not visualized. No evidence of pulmonic stenosis. Aorta: The aortic root is normal in size and structure. Venous: The inferior vena cava was not well  visualized. IAS/Shunts: No atrial level shunt detected by color flow Doppler. Kathlyn Sacramento MD Electronically signed by Kathlyn Sacramento MD Signature Date/Time: 05/23/2020/10:29:26 AM    Final     Cardiac Studies   Jun 01, 2020 1. Left ventricular ejection fraction, by estimation, is 50 to 55%. The  left ventricle has low normal function. Left ventricular endocardial  border not optimally defined to evaluate regional wall motion. There is  mild left ventricular hypertrophy. Left  ventricular diastolic parameters are indeterminate.  2. Right ventricular systolic function is normal. The right ventricular  size is normal. Tricuspid regurgitation signal is inadequate for assessing  PA pressure.  3. The mitral valve is normal in structure. No evidence of mitral valve  regurgitation. No evidence of mitral stenosis.  4. The aortic valve is normal in structure. Aortic valve regurgitation is  not visualized. No aortic stenosis is present.  5. Aortic dilatation noted. There is moderate dilatation of the ascending  aorta, measuring 45 mm.  6. The inferior vena cava is dilated in size with >50% respiratory  variability, suggesting right atrial pressure of 8 mmHg.  7. RV pacemaker lead is thickend. Can not rule out vegetation. Consider a  TEE  8. Challenging image quality   TEE 05/18/19 1. Left ventricular ejection fraction, by estimation, is 45 to 50%. The  left ventricle has mildly decreased function. Left ventricular endocardial  border not optimally defined to evaluate regional wall motion.  2. Right ventricular systolic function is normal. The right ventricular  size is normal. Tricuspid regurgitation signal is inadequate for assessing  PA pressure.  3. No left atrial/left atrial appendage thrombus was detected.  4. The mitral valve is normal in structure. Mild mitral valve  regurgitation. No evidence of mitral stenosis.  5. The aortic valve is normal in structure. Aortic valve  regurgitation is  not visualized. No aortic stenosis is present.   Conclusion(s)/Recommendation(s): No evidence of vegetation/infective  endocarditis on this transesophageal  echocardiogram.   Patient Profile     75 y.o. male with hx of AICD placement, CAD/MI, insulin-dependent diabetes who was hospitalized with septic shock, MMSA bacteremia, acute blood loss anemia 2/2 GIB. Patient ws intubated for respiratory failure, hospital course complicated by aflutter.  TEE showed no vegetation. Hgb stable. Kidney function worsening and nephrology consulted.   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 Remains in SR - Not on a/c due to blood loss anemia. Hgb stable - Echo showed preserved EF  AICD - unclear reason for ICD device It is a medtronic device - no events noted on interrogation  Acute respiratory failure MRSA PNA Septic shock MSSA bacetermia - TEE negative for vegetation - ID following - abx per primary team  Acute anemia - s/p transfusion - GI with no plan to scope - continue PPI  AMS - CT head with no acute finding - brain MRI - sedation held 1/17  AKI - baseline creatinine unknown - nephrology consulted for worsening kidney function  For questions or updates, please contact [facility] Please consult [email or web address] for contact info under        Signed, Cadence Ninfa Meeker, PA-C  05/18/2020, 10:28 AM

## 2020-05-19 ENCOUNTER — Inpatient Hospital Stay: Payer: No Typology Code available for payment source

## 2020-05-19 DIAGNOSIS — R4182 Altered mental status, unspecified: Secondary | ICD-10-CM

## 2020-05-19 DIAGNOSIS — G9340 Encephalopathy, unspecified: Secondary | ICD-10-CM | POA: Diagnosis not present

## 2020-05-19 DIAGNOSIS — J9601 Acute respiratory failure with hypoxia: Secondary | ICD-10-CM

## 2020-05-19 DIAGNOSIS — K921 Melena: Secondary | ICD-10-CM | POA: Diagnosis not present

## 2020-05-19 DIAGNOSIS — N179 Acute kidney failure, unspecified: Secondary | ICD-10-CM

## 2020-05-19 LAB — PROTEIN ELECTRO, RANDOM URINE
Albumin ELP, Urine: 31.8 %
Alpha-1-Globulin, U: 9.4 %
Alpha-2-Globulin, U: 14.9 %
Beta Globulin, U: 16.3 %
Gamma Globulin, U: 27.6 %
Total Protein, Urine: 87.7 mg/dL

## 2020-05-19 LAB — PARATHYROID HORMONE, INTACT (NO CA): PTH: 44 pg/mL (ref 15–65)

## 2020-05-19 LAB — BASIC METABOLIC PANEL
Anion gap: 10 (ref 5–15)
Anion gap: 11 (ref 5–15)
BUN: 95 mg/dL — ABNORMAL HIGH (ref 8–23)
BUN: 93 mg/dL — ABNORMAL HIGH (ref 8–23)
CO2: 22 mmol/L (ref 22–32)
CO2: 22 mmol/L (ref 22–32)
Calcium: 7.6 mg/dL — ABNORMAL LOW (ref 8.9–10.3)
Calcium: 7.6 mg/dL — ABNORMAL LOW (ref 8.9–10.3)
Chloride: 116 mmol/L — ABNORMAL HIGH (ref 98–111)
Chloride: 117 mmol/L — ABNORMAL HIGH (ref 98–111)
Creatinine, Ser: 4.69 mg/dL — ABNORMAL HIGH (ref 0.61–1.24)
Creatinine, Ser: 4.58 mg/dL — ABNORMAL HIGH (ref 0.61–1.24)
GFR, Estimated: 12 mL/min — ABNORMAL LOW (ref >60)
GFR, Estimated: 13 mL/min — ABNORMAL LOW (ref >60)
Glucose, Bld: 383 mg/dL — ABNORMAL HIGH (ref 70–99)
Glucose, Bld: 325 mg/dL — ABNORMAL HIGH (ref 70–99)
Potassium: 3.2 mmol/L — ABNORMAL LOW (ref 3.5–5.1)
Potassium: 3.5 mmol/L (ref 3.5–5.1)
Sodium: 148 mmol/L — ABNORMAL HIGH (ref 135–145)
Sodium: 150 mmol/L — ABNORMAL HIGH (ref 135–145)

## 2020-05-19 LAB — CULTURE, BLOOD (ROUTINE X 2)
Culture: NO GROWTH
Culture: NO GROWTH
Special Requests: ADEQUATE

## 2020-05-19 LAB — CBC WITH DIFFERENTIAL/PLATELET
Abs Immature Granulocytes: 0.49 10*3/uL — ABNORMAL HIGH (ref 0.00–0.07)
Basophils Absolute: 0 10*3/uL (ref 0.0–0.1)
Basophils Relative: 0 %
Eosinophils Absolute: 0.6 10*3/uL — ABNORMAL HIGH (ref 0.0–0.5)
Eosinophils Relative: 5 %
HCT: 29.2 % — ABNORMAL LOW (ref 39.0–52.0)
Hemoglobin: 9 g/dL — ABNORMAL LOW (ref 13.0–17.0)
Immature Granulocytes: 4 %
Lymphocytes Relative: 15 %
Lymphs Abs: 1.8 10*3/uL (ref 0.7–4.0)
MCH: 29.2 pg (ref 26.0–34.0)
MCHC: 30.8 g/dL (ref 30.0–36.0)
MCV: 94.8 fL (ref 80.0–100.0)
Monocytes Absolute: 0.8 10*3/uL (ref 0.1–1.0)
Monocytes Relative: 7 %
Neutro Abs: 7.8 10*3/uL — ABNORMAL HIGH (ref 1.7–7.7)
Neutrophils Relative %: 69 %
Platelets: 208 10*3/uL (ref 150–400)
RBC: 3.08 MIL/uL — ABNORMAL LOW (ref 4.22–5.81)
RDW: 19.5 % — ABNORMAL HIGH (ref 11.5–15.5)
WBC: 11.5 10*3/uL — ABNORMAL HIGH (ref 4.0–10.5)
nRBC: 0.3 % — ABNORMAL HIGH (ref 0.0–0.2)

## 2020-05-19 LAB — GLUCOSE, CAPILLARY
Glucose-Capillary: 305 mg/dL — ABNORMAL HIGH (ref 70–99)
Glucose-Capillary: 298 mg/dL — ABNORMAL HIGH (ref 70–99)
Glucose-Capillary: 323 mg/dL — ABNORMAL HIGH (ref 70–99)
Glucose-Capillary: 300 mg/dL — ABNORMAL HIGH (ref 70–99)
Glucose-Capillary: 266 mg/dL — ABNORMAL HIGH (ref 70–99)

## 2020-05-19 LAB — HEMOGLOBIN AND HEMATOCRIT, BLOOD
HCT: 28 % — ABNORMAL LOW (ref 39.0–52.0)
HCT: 31.3 % — ABNORMAL LOW (ref 39.0–52.0)
Hemoglobin: 8.6 g/dL — ABNORMAL LOW (ref 13.0–17.0)
Hemoglobin: 9.7 g/dL — ABNORMAL LOW (ref 13.0–17.0)

## 2020-05-19 LAB — PROTEIN ELECTROPHORESIS, SERUM
A/G Ratio: 0.6 — ABNORMAL LOW (ref 0.7–1.7)
Albumin ELP: 1.7 g/dL — ABNORMAL LOW (ref 2.9–4.4)
Alpha-1-Globulin: 0.4 g/dL (ref 0.0–0.4)
Alpha-2-Globulin: 0.9 g/dL (ref 0.4–1.0)
Beta Globulin: 0.9 g/dL (ref 0.7–1.3)
Gamma Globulin: 0.6 g/dL (ref 0.4–1.8)
Globulin, Total: 2.9 g/dL (ref 2.2–3.9)
Total Protein ELP: 4.6 g/dL — ABNORMAL LOW (ref 6.0–8.5)

## 2020-05-19 LAB — MPO/PR-3 (ANCA) ANTIBODIES
ANCA Proteinase 3: <3.5 U/mL (ref 0.0–3.5)
Myeloperoxidase Abs: <9 U/mL (ref 0.0–9.0)

## 2020-05-19 LAB — C3 COMPLEMENT: C3 Complement: 97 mg/dL (ref 82–167)

## 2020-05-19 LAB — TRIGLYCERIDES: Triglycerides: 118 mg/dL (ref <150)

## 2020-05-19 LAB — C4 COMPLEMENT: Complement C4, Body Fluid: 28 mg/dL (ref 12–38)

## 2020-05-19 LAB — MAGNESIUM: Magnesium: 2.1 mg/dL (ref 1.7–2.4)

## 2020-05-19 LAB — PHOSPHORUS: Phosphorus: 4.2 mg/dL (ref 2.5–4.6)

## 2020-05-19 LAB — GLOMERULAR BASEMENT MEMBRANE ANTIBODIES: GBM Ab: 3 units (ref 0–20)

## 2020-05-19 MED ORDER — METOPROLOL TARTRATE 5 MG/5ML IV SOLN
5.0000 mg | Freq: Four times a day (QID) | INTRAVENOUS | Status: DC | PRN
  Administered 2020-05-29: 5 mg via INTRAVENOUS
  Filled 2020-05-19: qty 5

## 2020-05-19 MED ORDER — ACETAMINOPHEN 160 MG/5ML PO SOLN
650.0000 mg | Freq: Four times a day (QID) | ORAL | Status: DC | PRN
  Filled 2020-05-19: qty 20.3

## 2020-05-19 MED ORDER — INSULIN ASPART 100 UNIT/ML ~~LOC~~ SOLN
14.0000 [IU] | SUBCUTANEOUS | Status: DC
  Administered 2020-05-19 – 2020-05-20 (×2): 14 [IU] via SUBCUTANEOUS
  Filled 2020-05-19 (×2): qty 1

## 2020-05-19 MED ORDER — INSULIN DETEMIR 100 UNIT/ML ~~LOC~~ SOLN
44.0000 [IU] | Freq: Two times a day (BID) | SUBCUTANEOUS | Status: DC
  Administered 2020-05-19 – 2020-05-31 (×25): 44 [IU] via SUBCUTANEOUS
  Filled 2020-05-19 (×28): qty 0.44

## 2020-05-19 MED ORDER — SODIUM CHLORIDE 0.9 % IV SOLN
8.0000 mg/h | INTRAVENOUS | Status: DC
  Administered 2020-05-19: 05:00:00 8 mg/h via INTRAVENOUS
  Filled 2020-05-19 (×2): qty 80

## 2020-05-19 MED ORDER — SODIUM CHLORIDE 0.9 % IV SOLN
80.0000 mg | Freq: Once | INTRAVENOUS | Status: AC
  Administered 2020-05-19: 05:00:00 80 mg via INTRAVENOUS
  Filled 2020-05-19: qty 80

## 2020-05-19 MED ORDER — METOLAZONE 5 MG PO TABS
5.0000 mg | ORAL_TABLET | Freq: Once | ORAL | Status: AC
  Administered 2020-05-19: 5 mg via ORAL
  Filled 2020-05-19: qty 1

## 2020-05-19 MED ORDER — ACETAMINOPHEN 160 MG/5ML PO SOLN
650.0000 mg | Freq: Four times a day (QID) | ORAL | Status: DC | PRN
  Administered 2020-05-19: 650 mg via ORAL
  Filled 2020-05-19 (×3): qty 20.3

## 2020-05-19 MED ORDER — POTASSIUM CHLORIDE 20 MEQ PO PACK
40.0000 meq | PACK | Freq: Once | ORAL | Status: AC
  Administered 2020-05-19: 40 meq
  Filled 2020-05-19: qty 2

## 2020-05-19 MED ORDER — FUROSEMIDE 10 MG/ML IJ SOLN
40.0000 mg | Freq: Once | INTRAMUSCULAR | Status: AC
  Administered 2020-05-19: 40 mg via INTRAVENOUS
  Filled 2020-05-19: qty 4

## 2020-05-19 MED ORDER — FENTANYL CITRATE (PF) 100 MCG/2ML IJ SOLN
25.0000 ug | INTRAMUSCULAR | Status: DC | PRN
Start: 2020-05-19 — End: 2020-05-27
  Administered 2020-05-21 – 2020-05-25 (×5): 50 ug via INTRAVENOUS
  Filled 2020-05-19 (×7): qty 2

## 2020-05-19 MED ORDER — METOLAZONE 5 MG PO TABS
5.0000 mg | ORAL_TABLET | Freq: Once | ORAL | Status: DC
  Filled 2020-05-19: qty 1

## 2020-05-19 MED ORDER — PANTOPRAZOLE SODIUM 40 MG IV SOLR: 40.0000 mg | Freq: Two times a day (BID) | INTRAVENOUS | Status: DC

## 2020-05-19 MED ORDER — METOLAZONE 5 MG PO TABS
5.0000 mg | ORAL_TABLET | Freq: Once | ORAL | Status: AC
  Administered 2020-05-19: 5 mg
  Filled 2020-05-19: qty 1

## 2020-05-19 MED ORDER — INSULIN ASPART 100 UNIT/ML ~~LOC~~ SOLN
10.0000 [IU] | SUBCUTANEOUS | Status: DC
  Administered 2020-05-19 (×2): 10 [IU] via SUBCUTANEOUS
  Filled 2020-05-19 (×2): qty 1

## 2020-05-19 NOTE — Procedures (Signed)
Patient Name: Billy Barton  MRN: [id number]  Epilepsy Attending: Charlsie Quest  Referring Physician/Provider: Dr. Christiane Ha devDewald Date: 05/19/2020 Duration: 24.08 mins  Patient history: 75 year old male with altered mental status.  EEG to evaluate for seizures.  Level of alertness: Comatose  AEDs during EEG study: None  Technical aspects: This EEG study was done with scalp electrodes positioned according to the 10-20 International system of electrode placement. Electrical activity was acquired at a sampling rate of 500Hz  and reviewed with a high frequency filter of 70Hz  and a low frequency filter of 1Hz . EEG data were recorded continuously and digitally stored.   Description: EEG showed continuous generalized predominantly 6 to 7 Hz theta as well as intermittent 2 to 3 Hz generalized delta slowing. Hyperventilation and photic stimulation were not performed.     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 No seizures or epileptiform discharges were seen throughout the recording.   Shaquel Chavous 

## 2020-05-19 NOTE — Progress Notes (Signed)
eeg done °

## 2020-05-19 NOTE — Progress Notes (Signed)
Progress Note  Patient Name: Troy Kanouse Date of Encounter: 05/19/2020  [facility] Cardiologist: No primary care provider on file.   Subjective   Patient is off sedation, still intubated and unresponsive. He remains in SR.     Inpatient Medications    Scheduled Meds: . chlorhexidine gluconate (MEDLINE KIT)  15 mL Mouth Rinse BID  . Chlorhexidine Gluconate Cloth  6 each Topical Q0600  . feeding supplement (PROSource TF)  90 mL Per Tube TID  . free water  200 mL Per Tube Q4H  . insulin aspart  0-15 Units Subcutaneous Q4H  . insulin aspart  10 Units Subcutaneous Q4H  . insulin detemir  44 Units Subcutaneous Q12H  . mouth rinse  15 mL Mouth Rinse 10 times per day  . metolazone  5 mg Oral Once  . multivitamin with minerals  1 tablet Per Tube Daily  . mupirocin ointment  1 application Nasal BID  . [START ON 05/22/2020] pantoprazole  40 mg Intravenous Q12H  . potassium chloride  40 mEq Per Tube Once   Continuous Infusions: . sodium chloride Stopped (05/13/20 0308)  . sodium chloride 5 mL/hr at 05/19/20 0600  .  ceFAZolin (ANCEF) IV 2 g (05/19/20 0753)  . dexmedetomidine (PRECEDEX) IV infusion    . feeding supplement (VITAL AF 1.2 CAL) 1,000 mL (05/09/2020 1353)  . fentaNYL infusion INTRAVENOUS Stopped (05/02/2020 1340)  . linezolid (ZYVOX) IV Stopped (05/18/20 2232)  . pantoprozole (PROTONIX) infusion 8 mg/hr (05/19/20 0600)   PRN Meds: sodium chloride, acetaminophen (TYLENOL) oral liquid 160 mg/5 mL, acetaminophen, dextrose, docusate, polyethylene glycol   Vital Signs    Vitals:   05/19/20 0600 05/19/20 0700 05/19/20 0747 05/19/20 0800  BP: 94/77 (!) 161/77  (!) 164/75  Pulse: 97 96  98  Resp: (!) 23 (!) 23  (!) 23  Temp:  100.22 F (37.9 C)  (!) 100.58 F (38.1 C)  TempSrc:      SpO2: 95% 96% 96% 94%  Weight:      Height:        Intake/Output Summary (Last 24 hours) at 05/19/2020 0903 Last data filed at 05/19/2020 0800 Gross per 24 hour  Intake 2558.95 ml   Output 2175 ml  Net 383.95 ml   Last 3 Weights 05/19/2020 05/18/2020 05/16/2020  Weight (lbs) 238 lb 15.7 oz 246 lb 11.1 oz 241 lb 10 oz  Weight (kg) 108.4 kg 111.9 kg 109.6 kg      Telemetry    NSR, PVCs, HR 90s - Personally Reviewed  ECG    No new - Personally Reviewed  Physical Exam   GEN: No acute distress.   Neck: No JVD Cardiac: RRR, no murmurs, rubs, or gallops.  Respiratory:diminished breath sounds. GI: Soft, nontender, non-distended  MS: 1+ edema; No deformity. Neuro:  Nonfocal  Psych: Normal affect   Labs    High Sensitivity Troponin:   Recent Labs  Lab 05/05/2020 2222 05/13/20 0026 05/13/20 0218 05/13/20 0548  TROPONINIHS 50* 47* 52* 67*      Chemistry Recent Labs  Lab 05/16/20 0551 05/06/2020 0326 05/18/20 0407 05/19/20 0355  NA 154* 156* 149* 148*  K 3.4* 3.6 3.6 3.2*  CL 119* 120* 116* 116*  CO2 $Re'25 25 23 22  'DcO$ GLUCOSE 103* 178* 367* 383*  BUN 99* 96* 96* 95*  CREATININE 4.89* 5.04* 4.93* 4.69*  CALCIUM 8.2* 7.7* 7.5* 7.6*  PROT 5.2* 5.1* 5.1*  --   ALBUMIN 1.8* 1.7* 1.6*  --   AST 16  15 12*  --   ALT <5 <5 <5  --   ALKPHOS 60 58 65  --   BILITOT 0.5 0.6 0.5  --   GFRNONAA 12* 11* 12* 12*  ANIONGAP $RemoveB'10 11 10 10     'KzknAEIa$ Hematology Recent Labs  Lab 05/18/20 0407 05/18/20 1835 05/19/20 0355  WBC 9.7 12.0* 11.5*  RBC 2.73* 3.11* 3.08*  HGB 8.1* 9.3* 9.0*  HCT 25.6* 29.2* 29.2*  MCV 93.8 93.9 94.8  MCH 29.7 29.9 29.2  MCHC 31.6 31.8 30.8  RDW 19.9* 19.7* 19.5*  PLT 171 193 208    BNPNo results for input(s): BNP, PROBNP in the last 168 hours.   DDimer No results for input(s): DDIMER in the last 168 hours.   Radiology    DG Abd 1 View  Result Date: 05/30/2020 CLINICAL DATA:  Orogastric tube placement EXAM: ABDOMEN - 1 VIEW COMPARISON:  Portable exam 1105 hours compared to 05/13/2020 FINDINGS: Tip of orogastric tube projects over mid stomach. Nonobstructive bowel gas pattern. No acute osseous findings. IMPRESSION: Tip of orogastric  tube projects over mid stomach. Electronically Signed   By: Lavonia Dana M.D.   On: 05/14/2020 11:21   US RENAL  Result Date: 05/18/2020 CLINICAL DATA:  Acute kidney injury EXAM: RENAL / URINARY TRACT ULTRASOUND COMPLETE COMPARISON:  None. FINDINGS: Right Kidney: Renal measurements: 10.9 x 6.3 x 5.8 cm = volume: 206 mL. Mild diffuse thinning of renal cortex. No mass. No hydronephrosis. Left Kidney: Renal measurements: 10.9 x 5.3 x 4.5 cm = volume: 135 mL. Mild diffuse thinning of renal cortex. No hydronephrosis. 2.3 cm simple cyst seen in the mid left kidney. Bladder: Limited evaluation due to collapse configuration.  Foley in place. Other: None. IMPRESSION: Mild diffuse thinning of renal cortex consistent with chronic medical renal disease. Electronically Signed   By: Miachel Roux M.D.   On: 05/18/2020 10:32   DG Chest Port 1 View  Result Date: 05/19/2020 CLINICAL DATA:  Acute respiratory failure. EXAM: PORTABLE CHEST 1 VIEW COMPARISON:  Chest x-ray 05/21/2020. FINDINGS: Limited exam, hemidiaphragms not completely imaged. Endotracheal tube, right IJ line in stable position. NG tube tip below the level of the image. Cardiac pacer in stable position. Cardiomegaly. No pulmonary venous congestion. Persistent left base infiltrate noted. No prominent pleural effusion. No pneumothorax. IMPRESSION: 1. Lines and tubes in stable position. 2. Cardiac pacer in stable position. Stable cardiomegaly. 3. Persistent left base infiltrate. Electronically Signed   By: Marcello Moores  Register   On: 05/19/2020 06:15   ECHO TEE  Result Date: 05/05/2020    TRANSESOPHOGEAL ECHO REPORT   Patient Name:   DURWOOD DITTUS Date of Exam: 05/12/2020 Medical Rec #:  [id number]    Height:       68.0 in Accession #:    [id number]   Weight:       241.6 lb Date of Birth:  10/11/1945    BSA:          2.215 m Patient Age:    75 years     BP:           123/63 mmHg Patient Gender: M            HR:           92 bpm. Exam Location:  [facility] Procedure:  Transesophageal Echo, Color Doppler and Cardiac Doppler Indications:     Not listed on TEE check-in  History:         Patient has prior history of Echocardiogram  examinations, most                  recent 05/14/2020. CAD; Risk Factors:Diabetes. AICD.  Sonographer:     Sherrie Sport RDCS (AE) Referring Phys:  [id number] [place] Diagnosing Phys: Kathlyn Sacramento MD PROCEDURE: The transesophogeal probe was passed without difficulty through the esophogus of the patient. Sedation performed by performing physician. Image quality was good. The patient's vital signs; including heart rate, blood pressure, and oxygen saturation; remained stable throughout the procedure. The patient developed no complications during the procedure. IMPRESSIONS  1. Left ventricular ejection fraction, by estimation, is 45 to 50%. The left ventricle has mildly decreased function. Left ventricular endocardial border not optimally defined to evaluate regional wall motion.  2. Right ventricular systolic function is normal. The right ventricular size is normal. Tricuspid regurgitation signal is inadequate for assessing PA pressure.  3. No left atrial/left atrial appendage thrombus was detected.  4. The mitral valve is normal in structure. Mild mitral valve regurgitation. No evidence of mitral stenosis.  5. The aortic valve is normal in structure. Aortic valve regurgitation is not visualized. No aortic stenosis is present. Conclusion(s)/Recommendation(s): No evidence of vegetation/infective endocarditis on this transesophageal echocardiogram. FINDINGS  Left Ventricle: Left ventricular ejection fraction, by estimation, is 45 to 50%. The left ventricle has mildly decreased function. Left ventricular endocardial border not optimally defined to evaluate regional wall motion. The left ventricular internal cavity size was normal in size. There is no left ventricular hypertrophy. Right Ventricle: The right ventricular size is normal. No increase in right  ventricular wall thickness. Right ventricular systolic function is normal. Tricuspid regurgitation signal is inadequate for assessing PA pressure. Left Atrium: Left atrial size was normal in size. No left atrial/left atrial appendage thrombus was detected. Right Atrium: Right atrial size was normal in size. Pericardium: There is no evidence of pericardial effusion. Mitral Valve: The mitral valve is normal in structure. Mild mitral valve regurgitation. No evidence of mitral valve stenosis. Tricuspid Valve: The tricuspid valve is normal in structure. Tricuspid valve regurgitation is mild . No evidence of tricuspid stenosis. Aortic Valve: The aortic valve is normal in structure. Aortic valve regurgitation is not visualized. No aortic stenosis is present. Pulmonic Valve: The pulmonic valve was normal in structure. Pulmonic valve regurgitation is not visualized. No evidence of pulmonic stenosis. Aorta: The aortic root is normal in size and structure. Venous: The inferior vena cava was not well visualized. IAS/Shunts: No atrial level shunt detected by color flow Doppler. Kathlyn Sacramento MD Electronically signed by Kathlyn Sacramento MD Signature Date/Time: 05/26/2020/10:29:26 AM    Final     Cardiac Studies   Echo 05/2020 1. Left ventricular ejection fraction, by estimation, is 50 to 55%. The  left ventricle has low normal function. Left ventricular endocardial  border not optimally defined to evaluate regional wall motion. There is  mild left ventricular hypertrophy. Left  ventricular diastolic parameters are indeterminate.  2. Right ventricular systolic function is normal. The right ventricular  size is normal. Tricuspid regurgitation signal is inadequate for assessing  PA pressure.  3. The mitral valve is normal in structure. No evidence of mitral valve  regurgitation. No evidence of mitral stenosis.  4. The aortic valve is normal in structure. Aortic valve regurgitation is  not visualized. No aortic  stenosis is present.  5. Aortic dilatation noted. There is moderate dilatation of the ascending  aorta, measuring 45 mm.  6. The inferior vena cava is dilated in size with >50%  respiratory  variability, suggesting right atrial pressure of 8 mmHg.  7. RV pacemaker lead is thickend. Can not rule out vegetation. Consider a  TEE  8. Challenging image quality   TEE 05/18/19 1. Left ventricular ejection fraction, by estimation, is 45 to 50%. The  left ventricle has mildly decreased function. Left ventricular endocardial  border not optimally defined to evaluate regional wall motion.  2. Right ventricular systolic function is normal. The right ventricular  size is normal. Tricuspid regurgitation signal is inadequate for assessing  PA pressure.  3. No left atrial/left atrial appendage thrombus was detected.  4. The mitral valve is normal in structure. Mild mitral valve  regurgitation. No evidence of mitral stenosis.  5. The aortic valve is normal in structure. Aortic valve regurgitation is  not visualized. No aortic stenosis is present.   Conclusion(s)/Recommendation(s): No evidence of vegetation/infective  endocarditis on this transesophageal  echocardiogram.    Patient Profile     75 y.o. male with hx of AICD placement, CAD/MI, insulin-dependent diabetes who was hospitalized with septic shock, MMSA bacteremia, acute blood loss anemia 2/2 GIB. Patient ws intubated for respiratory failure, hospital course complicated by aflutter. TEE showed no vegetation. Hgb stable. Kidney function worsening and nephrology consulted.   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 Hgb stable - Echo showed preserved EF  AICD - unclear reason for ICD device It is a medtronic device - no events noted on interrogation - ICD explantation discussed with EP. No plan for procedure at this time in the setting of multiple comorbidities.  Acute  respiratory failure MRSA PNA Septic shock MSSA bacetermia - TEE negative for vegetation - ID following - Patient is off sedation however unable to extubate - abx per primary team  Acute anemia - s/p transfusion - GI with no plan to scope - continue PPI per primary team  AMS - In the setting of septic shock and uremia - CT head with no acute finding - brain MRI - sedation held 1/17. Patient is not waking up - EEG today  AKI - in the setting of septic shock - baseline creatinine unknown - nephrology consulted for worsening kidney function - given IV lasix $Remove'40mg'TBJsEbj$  x 2  and $Re'5mg'qIf$  metolazone    For questions or updates, please contact [facility] Please consult [email or web address] for contact info under        Signed, Amarisa Wilinski Ninfa Meeker, PA-C  05/19/2020, 9:03 AM

## 2020-05-19 NOTE — Progress Notes (Signed)
NAME:  Billy Barton, MRN:  [id number], DOB:  01-09-1946, LOS: 7 ADMISSION DATE:  May 13, 2020, CONSULTATION DATE:  05/13/20 REFERRING MD:  Minna Antis, CHIEF COMPLAINT:  DKA, altered mental status   Brief History:  75yo male w/unknown past medical history who presented to the ED w/AMS and was found to be in DKA vs HSS with a blood sugar >600 and lactate 5.8  History of Present Illness:  75yo male w/unknown past medical history who presented to the ED via EMS after the son found him unresponsive, face down in the kitchen, incontinent of urine. Per the ED, the patient son reported to them they call and check on him daily and last spoke with him yesterday. When EMS arrived, the patient was awake but became unresponsive en route to the ED.   On arrival to the ED, the patient was arousable only to painful stimuli, hypothermic 94 otherwise stable vital signs. Labs revealed a blood glucose > 600, lactate 5.8, WBC 26.2, Hgb 7.1 and VBG: 7.19/36 and bicarb 13. He was given 1L LR bolus, started on endotool and antibiotics were initiated. Rapid COVID test negative  ICU was called for admission.   05/14/20 - patient with MSSA bacteremia, critially ill on mechanical ventilator.  Remains on vasopressor. Insulin gtt is off today.  Active GI bleed s/p PRBC transfusion. 05/15/20- patient remains hypotensive on MV.  Possible EGD when more stable - spoke with GI- Dr Lacretia Nicks today. Still with edema 2+ on vasopressors.  05/16/20- patient on MV.  A line not functioning will dc today.  Pt in sinus rhythm remains intubated septic awaiting tee for endocarditis workup.  05/14/2020 - TEE performed at bedside.  05/18/20 - not waking up off sedation, nephrology consulted  Past Medical History:  Unknown  Significant Hospital Events:  1/13-Admitted to ICU  Consults:  GI Cardiology ID Nephrology  Procedures:  ETT 1/13>> A line 1/13 - 1/16 CVL 1/13>>  Significant Diagnostic Tests:  CT head 05/13/20 Stable non  contrast CT appearance of the brain. No CT evidence of anoxic injury. 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  TEE 05/29/2020 EF 40-50% RV systolic function normal No evidence of vegetation/infective endocarditis  Micro Data:  Bld Cx 2020-05-13 - MSSA Bld Cx 05/14/20 - Negative  Tracheal Aspirate 05/14/20 - MRSA  Antimicrobials:  Vancomycin 1/12 Cefepime 1/12  Flagyl 1/12 Zosyn 1/13  Cefazolin 1/13>>  Interim History / Subjective:  No acute events overnight. Dark bowel movement noted but no change in H/H  Starting to withdrawal to painful stimuli  Objective   Blood pressure 94/77, pulse 97, temperature 99.68 F (37.6 C), resp. rate (!) 23, height 5\' 8"  (1.727 m), weight 108.4 kg, SpO2 95 %.    Vent Mode: PRVC FiO2 (%):  [30 %] 30 % Set Rate:  [16 bmp] 16 bmp Vt Set:  [500 mL] 500 mL PEEP:  [5 cmH20] 5 cmH20   Intake/Output Summary (Last 24 hours) at 05/19/2020 0738 Last data filed at 05/19/2020 0600 Gross per 24 hour  Intake 2558.95 ml  Output 1875 ml  Net 683.95 ml   Filed Weights   05/16/20 0500 05/18/20 0500 05/19/20 0349  Weight: 109.6 kg 111.9 kg 108.4 kg    Examination: General: intubated, no acute distress HENT: [place]/AT, PERRL, sclera anicteric, roving eyes Lungs: Clear to auscultation Cardiovascular: RRR, s1s2, no murmur Abdomen: soft, non-distended, BS+ Extremities: no edema, warm Neuro: withdraws to pain, no purposeful movement  GU: Foley catheter in place  [facility]  Problem list   DKA Shock  Assessment & Plan:   Acute Hypoxemic Respiratory Failure MRSA Pneumonia In setting of septic shock due to MSSA bacteremia and Cardiac Arrest - Barrier to extubation appears to be mental status at this time  - Tracheal aspirate from 05/14/20 has grown MRSA, treating with linezolid  - VAP bundle ordered  Acute Kidney Injury, non-oliguric Hypernatremia Uremia In setting of septic shock - Unknown baseline creatinine and renal  function improved slightly over past couple days - Give lasix 40mg  IV and 5mg  metolazone PO today - Nephrology following  Encephalopathy In setting of shock, sepsis and possibly uremia. Possibly medication related. - Continue to hold sedation - add precedex if becomes agitated - will check spot EEG - CT head 1/13 with no acute findings  MSSA Bacteremia - ID following, treating with cefazolin - TEE negative for vegetations  Acute Blood Loss Anemia In setting of acute GI bleeding secondary to sepsis - Last transfusion 05/14/20 - GI not planning to scope, has signed off - On PPI gtt today after dark bowel movement overnight. Will transition back to BID PPI dosing once drip has run out. - Trend CBC q6hrs today  Diabetes Mellitus  - SSI q4hrs - Levemir 38 units BID - TF coverage increased to 6 units q4hrs  Best practice (evaluated daily)  Diet: TF Pain/Anxiety/Delirium protocol (if indicated): Fentanyl, precedex VAP protocol (if indicated): ordered DVT prophylaxis: scds GI prophylaxis: Pantoprazole BID Glucose control: SSI, levemir Mobility: bed rest Disposition:ICU Code Status: Full  Labs   CBC: Recent Labs  Lab 05/16/2020 2222 05/13/20 0026 05/14/20 2104 05/15/20 0558 05/16/20 0551 05/06/2020 0326 05/18/20 0407 05/18/20 1835 05/19/20 0355  WBC 26.2*   < > 13.6*   < > 15.6* 12.8* 9.7 12.0* 11.5*  NEUTROABS 21.6*  --  10.8*  --   --   --   --   --  7.8*  HGB 7.1*   < > 9.3*   < > 9.6* 8.4* 8.1* 9.3* 9.0*  HCT 22.0*   < > 26.9*   < > 28.5* 26.6* 25.6* 29.2* 29.2*  MCV 99.5   < > 88.8   < > 91.3 94.0 93.8 93.9 94.8  PLT 227   < > 108*   < > 110* 123* 171 193 208   < > = values in this interval not displayed.    Basic Metabolic Panel: Recent Labs  Lab 05/15/20 0558 05/16/20 0551 05/04/2020 0326 05/27/2020 1652 05/18/20 0407 05/18/20 1835 05/19/20 0355  NA 151* 154* 156*  --  149*  --  148*  K 3.9 3.4* 3.6  --  3.6  --  3.2*  CL 117* 119* 120*  --  116*  --  116*   CO2 24 25 25   --  23  --  22  GLUCOSE 198* 103* 178*  --  367*  --  383*  BUN 112* 99* 96*  --  96*  --  95*  CREATININE 4.69* 4.89* 5.04*  --  4.93*  --  4.69*  CALCIUM 8.3* 8.2* 7.7*  --  7.5*  --  7.6*  MG 1.8 1.9 1.9  --  2.1  --  2.1  PHOS 2.8 2.3* 3.3 3.1 3.7 3.9 4.2   GFR: Estimated Creatinine Clearance: 16.5 mL/min (A) (by C-G formula based on SCr of 4.69 mg/dL (H)). Recent Labs  Lab 05/29/2020 2222 05/13/20 0026 05/13/20 0218 05/13/20 0548 05/13/20 1000 05/16/2020 0326 05/18/20 0407 05/18/20 1835 05/19/20 0355  WBC  26.2* 21.4* 29.4* 27.2*   < > 12.8* 9.7 12.0* 11.5*  LATICACIDVEN 5.8* 2.8* 3.4* 2.0*  --   --   --   --   --    < > = values in this interval not displayed.    Liver Function Tests: Recent Labs  Lab 05/14/20 0333 05/15/20 0558 05/16/20 0551 05/09/2020 0326 05/18/20 0407  AST 21 20 16 15  12*  ALT 10 6 <5 <5 <5  ALKPHOS 46 48 60 58 65  BILITOT 0.9 0.5 0.5 0.6 0.5  PROT 4.8* 4.8* 5.2* 5.1* 5.1*  ALBUMIN 2.1* 1.9* 1.8* 1.7* 1.6*   No results for input(s): LIPASE, AMYLASE in the last 168 hours. No results for input(s): AMMONIA in the last 168 hours.  ABG    Component Value Date/Time   PHART 7.40 05/14/2020 0333   PCO2ART 32 05/14/2020 0333   PO2ART 87 05/14/2020 0333   HCO3 19.8 (L) 05/14/2020 0333   ACIDBASEDEF 4.6 (H) 05/14/2020 0333   O2SAT 96.6 05/14/2020 0333     Coagulation Profile: Recent Labs  Lab 05/20/2020 2222 05/13/20 0218  INR 1.4* 1.5*    Cardiac Enzymes: Recent Labs  Lab 05/30/2020 2222  CKTOTAL 149    HbA1C: Hgb A1c MFr Bld  Date/Time Value Ref Range Status  05/13/2020 02:49 PM 10.2 (H) 4.8 - 5.6 % Final    Comment:    (NOTE) Pre diabetes:          5.7%-6.4%  Diabetes:              >6.4%  Glycemic control for   <7.0% adults with diabetes     CBG: Recent Labs  Lab 05/18/20 1635 05/18/20 1933 05/18/20 2303 05/19/20 0311 05/19/20 0726  GLUCAP 300* 300* 354* 305* 298*     Critical care time: 40  minutes     05/21/20, MD [facility] Office: [phone]   See Amion for Pager Details

## 2020-05-19 NOTE — Progress Notes (Signed)
[facility]  ROUNDING NOTE   Subjective:  Patient remains critically ill. Creatinine 4.69. However urine output 1.4 L over the preceding 24 hours.   Objective:  Vital signs in last 24 hours:  Temp:  [99.5 F (37.5 C)-100.58 F (38.1 C)] 100.04 F (37.8 C) (01/19 1100) Pulse Rate:  [89-98] 96 (01/19 1100) Resp:  [19-24] 24 (01/19 1100) BP: (94-183)/(67-95) 162/79 (01/19 1100) SpO2:  [93 %-98 %] 95 % (01/19 1153) FiO2 (%):  [28 %-30 %] 28 % (01/19 1153) Weight:  [108.4 kg] 108.4 kg (01/19 0349)  Weight change: -3.5 kg Filed Weights   05/16/20 0500 05/18/20 0500 05/19/20 0349  Weight: 109.6 kg 111.9 kg 108.4 kg    Intake/Output: I/O last 3 completed shifts: In: 3973 [I.V.:949.3; NG/GT:1530; IV Piggyback:1493.8] Out: 2875 [Urine:2425; Emesis/NG output:450]   Intake/Output this shift:  Total I/O In: -  Out: 950 [Urine:950]  Physical Exam: General:  Critically ill-appearing  Head:  Endotracheal tube in place  Eyes:  Anicteric  Neck:  Supple  Lungs:   Scattered rhonchi, vent assisted  Heart:  S1S2 no rubs  Abdomen:   Soft, nontender, bowel sounds present  Extremities:  1+ peripheral edema.  Neurologic:  Intubated  Skin:  No rash  Access:  No dialysis access    Basic Metabolic Panel: Recent Labs  Lab 05/15/20 0558 05/16/20 0551 05/12/2020 0326 05/14/2020 1652 05/18/20 0407 05/18/20 1835 05/19/20 0355  NA 151* 154* 156*  --  149*  --  148*  K 3.9 3.4* 3.6  --  3.6  --  3.2*  CL 117* 119* 120*  --  116*  --  116*  CO2 $Re'24 25 25  'PhB$ --  23  --  22  GLUCOSE 198* 103* 178*  --  367*  --  383*  BUN 112* 99* 96*  --  96*  --  95*  CREATININE 4.69* 4.89* 5.04*  --  4.93*  --  4.69*  CALCIUM 8.3* 8.2* 7.7*  --  7.5*  --  7.6*  MG 1.8 1.9 1.9  --  2.1  --  2.1  PHOS 2.8 2.3* 3.3 3.1 3.7 3.9 4.2    Liver Function Tests: Recent Labs  Lab 05/14/20 0333 05/15/20 0558 05/16/20 0551 05/23/2020 0326 05/18/20 0407  AST $Re'21 20 16 15 'wOT$ 12*  ALT 10 6 <5 <5 <5   ALKPHOS 46 48 60 58 65  BILITOT 0.9 0.5 0.5 0.6 0.5  PROT 4.8* 4.8* 5.2* 5.1* 5.1*  ALBUMIN 2.1* 1.9* 1.8* 1.7* 1.6*   No results for input(s): LIPASE, AMYLASE in the last 168 hours. No results for input(s): AMMONIA in the last 168 hours.  CBC: Recent Labs  Lab 05/27/2020 2222 05/13/20 0026 05/14/20 2104 05/15/20 0558 05/16/20 0551 05/11/2020 0326 05/18/20 0407 05/18/20 1835 05/19/20 0355 05/19/20 1120  WBC 26.2*   < > 13.6*   < > 15.6* 12.8* 9.7 12.0* 11.5*  --   NEUTROABS 21.6*  --  10.8*  --   --   --   --   --  7.8*  --   HGB 7.1*   < > 9.3*   < > 9.6* 8.4* 8.1* 9.3* 9.0* 8.6*  HCT 22.0*   < > 26.9*   < > 28.5* 26.6* 25.6* 29.2* 29.2* 28.0*  MCV 99.5   < > 88.8   < > 91.3 94.0 93.8 93.9 94.8  --   PLT 227   < > 108*   < > 110* 123* 171 193  208  --    < > = values in this interval not displayed.    Cardiac Enzymes: Recent Labs  Lab 05/30/2020 2222  CKTOTAL 149    BNP: Invalid input(s): POCBNP  CBG: Recent Labs  Lab 05/18/20 1933 05/18/20 2303 05/19/20 0311 05/19/20 0726 05/19/20 1126  GLUCAP 300* 354* 305* 298* 323*    Microbiology: Results for orders placed or performed during the hospital encounter of 05/03/2020  Blood culture (routine single)     Status: Abnormal   Collection Time: 05/25/2020 10:24 PM   Specimen: BLOOD  Result Value Ref Range Status   Specimen Description   Final    BLOOD LEFT ASSIST CONTROL Performed at [facility], [address] [place], [place] [address]    Special Requests   Final    BOTTLES DRAWN AEROBIC AND ANAEROBIC Blood Culture adequate volume Performed at [facility], [address] [place], [place] [address]    Culture  Setup Time   Final    Organism ID to follow GRAM POSITIVE COCCI IN BOTH AEROBIC AND ANAEROBIC BOTTLES CRITICAL RESULT CALLED TO, READ BACK BY AND VERIFIED WITH: MYRA SLAUGHTER AT 1309 ON 05/13/20 SNG GRAM NEGATIVE RODS ANAEROBIC BOTTLE ONLY CRITICAL RESULT CALLED TO, READ BACK BY  AND VERIFIED WITH: MORGAN HICKS PHARMD $[email or web address]$  05/14/20 EB Performed at [facility], [address], [place], [place] [address]    Culture STAPHYLOCOCCUS AUREUS PROTEUS MIRABILIS  (A)  Final   Report Status 05/16/2020 FINAL  Final   Organism ID, Bacteria STAPHYLOCOCCUS AUREUS  Final   Organism ID, Bacteria PROTEUS MIRABILIS  Final      Susceptibility   Proteus mirabilis - MIC*    AMPICILLIN <=2 SENSITIVE Sensitive     CEFAZOLIN <=4 SENSITIVE Sensitive     CEFEPIME <=0.12 SENSITIVE Sensitive     CEFTAZIDIME <=1 SENSITIVE Sensitive     CEFTRIAXONE <=0.25 SENSITIVE Sensitive     CIPROFLOXACIN <=0.25 SENSITIVE Sensitive     GENTAMICIN <=1 SENSITIVE Sensitive     IMIPENEM 8 INTERMEDIATE Intermediate     TRIMETH/SULFA <=20 SENSITIVE Sensitive     AMPICILLIN/SULBACTAM <=2 SENSITIVE Sensitive     PIP/TAZO <=4 SENSITIVE Sensitive     * PROTEUS MIRABILIS   Staphylococcus aureus - MIC*    CIPROFLOXACIN <=0.5 SENSITIVE Sensitive     ERYTHROMYCIN <=0.25 SENSITIVE Sensitive     GENTAMICIN <=0.5 SENSITIVE Sensitive     OXACILLIN 0.5 SENSITIVE Sensitive     TETRACYCLINE <=1 SENSITIVE Sensitive     VANCOMYCIN 1 SENSITIVE Sensitive     TRIMETH/SULFA <=10 SENSITIVE Sensitive     CLINDAMYCIN <=0.25 SENSITIVE Sensitive     RIFAMPIN <=0.5 SENSITIVE Sensitive     Inducible Clindamycin NEGATIVE Sensitive     * STAPHYLOCOCCUS AUREUS  Culture, blood (single)     Status: None   Collection Time: 05/08/2020 10:24 PM   Specimen: BLOOD  Result Value Ref Range Status   Specimen Description BLOOD LEFT HAND  Final   Special Requests   Final    BOTTLES DRAWN AEROBIC AND ANAEROBIC Blood Culture adequate volume   Culture   Final    NO GROWTH 5 DAYS Performed at [facility], [address], [place], [place] [address]    Report Status 05/21/2020 FINAL  Final  Blood Culture ID Panel (Reflexed)     Status: Abnormal   Collection Time: 05/02/2020 10:24 PM  Result Value Ref Range Status    Enterococcus faecalis NOT DETECTED NOT DETECTED Final  Enterococcus Faecium NOT DETECTED NOT DETECTED Final   Listeria monocytogenes NOT DETECTED NOT DETECTED Final   Staphylococcus species DETECTED (A) NOT DETECTED Final    Comment: CRITICAL RESULT CALLED TO, READ BACK BY AND VERIFIED WITH: MYRA SLAUGHTER AT 1309 ON 05/13/20 SNG    Staphylococcus aureus (BCID) DETECTED (A) NOT DETECTED Final    Comment: CRITICAL RESULT CALLED TO, READ BACK BY AND VERIFIED WITH: MYRA SLAUGHTER AT 1309 05/13/20 SNG    Staphylococcus epidermidis NOT DETECTED NOT DETECTED Final   Staphylococcus lugdunensis NOT DETECTED NOT DETECTED Final   Streptococcus species NOT DETECTED NOT DETECTED Final   Streptococcus agalactiae NOT DETECTED NOT DETECTED Final   Streptococcus pneumoniae NOT DETECTED NOT DETECTED Final   Streptococcus pyogenes NOT DETECTED NOT DETECTED Final   A.calcoaceticus-baumannii NOT DETECTED NOT DETECTED Final   Bacteroides fragilis NOT DETECTED NOT DETECTED Final   Enterobacterales NOT DETECTED NOT DETECTED Final   Enterobacter cloacae complex NOT DETECTED NOT DETECTED Final   Escherichia coli NOT DETECTED NOT DETECTED Final   Klebsiella aerogenes NOT DETECTED NOT DETECTED Final   Klebsiella oxytoca NOT DETECTED NOT DETECTED Final   Klebsiella pneumoniae NOT DETECTED NOT DETECTED Final   Proteus species NOT DETECTED NOT DETECTED Final   Salmonella species NOT DETECTED NOT DETECTED Final   Serratia marcescens NOT DETECTED NOT DETECTED Final   Haemophilus influenzae NOT DETECTED NOT DETECTED Final   Neisseria meningitidis NOT DETECTED NOT DETECTED Final   Pseudomonas aeruginosa NOT DETECTED NOT DETECTED Final   Stenotrophomonas maltophilia NOT DETECTED NOT DETECTED Final   Candida albicans NOT DETECTED NOT DETECTED Final   Candida auris NOT DETECTED NOT DETECTED Final   Candida glabrata NOT DETECTED NOT DETECTED Final   Candida krusei NOT DETECTED NOT DETECTED Final   Candida parapsilosis  NOT DETECTED NOT DETECTED Final   Candida tropicalis NOT DETECTED NOT DETECTED Final   Cryptococcus neoformans/gattii NOT DETECTED NOT DETECTED Final   Meth resistant mecA/C and MREJ NOT DETECTED NOT DETECTED Final    Comment: Performed at [facility], [address], [place], [place] [address]  Urine culture     Status: None   Collection Time: 05/13/20 12:26 AM   Specimen: In/Out Cath Urine  Result Value Ref Range Status   Specimen Description   Final    IN/OUT CATH URINE Performed at [facility], [address], [place], [place] [address]    Special Requests   Final    NONE Performed at [facility], [address], [place], [place] [address]    Culture   Final    NO GROWTH Performed at [facility], [place] [address] [place], [place] [address]    Report Status 05/14/2020 FINAL  Final  Resp Panel by RT-PCR (Flu A&B, Covid) Nasopharyngeal Swab     Status: None   Collection Time: 05/13/20 12:26 AM   Specimen: Nasopharyngeal Swab; Nasopharyngeal(NP) swabs in vial transport medium  Result Value Ref Range Status   SARS Coronavirus 2 by RT PCR NEGATIVE NEGATIVE Final    Comment: (NOTE) SARS-CoV-2 target nucleic acids are NOT DETECTED.  The SARS-CoV-2 RNA is generally detectable in upper respiratory specimens during the acute phase of infection. The lowest concentration of SARS-CoV-2 viral copies this assay can detect is 138 copies/mL. A negative result does not preclude SARS-Cov-2 infection and should not be used as the sole basis for treatment or other patient management decisions. A negative result may occur with  improper specimen collection/handling, submission of specimen  other than nasopharyngeal swab, presence of viral mutation(s) within the areas targeted by this assay, and inadequate number of viral copies(<138 copies/mL). A negative result must be combined with clinical observations, patient history, and epidemiological information.  The expected result is Negative.  Fact Sheet for Patients:  EntrepreneurPulse.com.au  Fact Sheet for Healthcare Providers:  IncredibleEmployment.be  This test is no t yet approved or cleared by the [place] FDA and  has been authorized for detection and/or diagnosis of SARS-CoV-2 by FDA under an Emergency Use Authorization (EUA). This EUA will remain  in effect (meaning this test can be used) for the duration of the COVID-19 declaration under Section 564(b)(1) of the Act, 21 U.S.C.section 360bbb-3(b)(1), unless the authorization is terminated  or revoked sooner.       Influenza A by PCR NEGATIVE NEGATIVE Final   Influenza B by PCR NEGATIVE NEGATIVE Final    Comment: (NOTE) The Xpert Xpress SARS-CoV-2/FLU/RSV plus assay is intended as an aid in the diagnosis of influenza from Nasopharyngeal swab specimens and should not be used as a sole basis for treatment. Nasal washings and aspirates are unacceptable for Xpert Xpress SARS-CoV-2/FLU/RSV testing.  Fact Sheet for Patients: EntrepreneurPulse.com.au  Fact Sheet for Healthcare Providers: IncredibleEmployment.be  This test is not yet approved or cleared by the [place] FDA and has been authorized for detection and/or diagnosis of SARS-CoV-2 by FDA under an Emergency Use Authorization (EUA). This EUA will remain in effect (meaning this test can be used) for the duration of the COVID-19 declaration under Section 564(b)(1) of the Act, 21 U.S.C. section 360bbb-3(b)(1), unless the authorization is terminated or revoked.  Performed at [facility], [address] [place], [place] [address]   Culture, blood (Routine X 2) w Reflex to ID Panel     Status: None   Collection Time: 05/14/20  2:41 PM   Specimen: BLOOD  Result Value Ref Range Status   Specimen Description BLOOD RIGHT ANTECUBITAL  Final   Special Requests   Final    BOTTLES DRAWN  AEROBIC AND ANAEROBIC Blood Culture adequate volume   Culture   Final    NO GROWTH 5 DAYS Performed at [facility], [address] [place], [place] [address]    Report Status 05/19/2020 FINAL  Final  Culture, blood (Routine X 2) w Reflex to ID Panel     Status: None   Collection Time: 05/14/20  2:55 PM   Specimen: BLOOD  Result Value Ref Range Status   Specimen Description BLOOD BLOOD RIGHT HAND  Final   Special Requests   Final    BOTTLES DRAWN AEROBIC AND ANAEROBIC Blood Culture results may not be optimal due to an excessive volume of blood received in culture bottles   Culture   Final    NO GROWTH 5 DAYS Performed at [facility], [address], [place], [place] [address]    Report Status 05/19/2020 FINAL  Final  Culture, respiratory (non-expectorated)     Status: None   Collection Time: 05/14/20  5:34 PM   Specimen: Tracheal Aspirate; Respiratory  Result Value Ref Range Status   Specimen Description   Final    TRACHEAL ASPIRATE Performed at [facility], [address], [place], [place] [address]    Special Requests   Final    NONE Performed at [facility], [address] [place],  [address]    Gram Stain   Final    MODERATE WBC PRESENT, PREDOMINANTLY PMN FEW GRAM POSITIVE  COCCI IN CLUSTERS Performed at [facility], [place] [address] [place], [place] [address]    Culture RARE METHICILLIN RESISTANT STAPHYLOCOCCUS AUREUS  Final   Report Status 05/09/2020 FINAL  Final   Organism ID, Bacteria METHICILLIN RESISTANT STAPHYLOCOCCUS AUREUS  Final      Susceptibility   Methicillin resistant staphylococcus aureus - MIC*    CIPROFLOXACIN >=8 RESISTANT Resistant     ERYTHROMYCIN >=8 RESISTANT Resistant     GENTAMICIN <=0.5 SENSITIVE Sensitive     OXACILLIN >=4 RESISTANT Resistant     TETRACYCLINE <=1 SENSITIVE Sensitive     VANCOMYCIN <=0.5 SENSITIVE Sensitive     TRIMETH/SULFA <=10 SENSITIVE Sensitive     CLINDAMYCIN >=8  RESISTANT Resistant     RIFAMPIN <=0.5 SENSITIVE Sensitive     Inducible Clindamycin NEGATIVE Sensitive     * RARE METHICILLIN RESISTANT STAPHYLOCOCCUS AUREUS  MRSA PCR Screening     Status: Abnormal   Collection Time: 05/14/20  9:04 PM   Specimen: Nasopharyngeal  Result Value Ref Range Status   MRSA by PCR POSITIVE (A) NEGATIVE Final    Comment:        The GeneXpert MRSA Assay (FDA approved for NASAL specimens only), is one component of a comprehensive MRSA colonization surveillance program. It is not intended to diagnose MRSA infection nor to guide or monitor treatment for MRSA infections. RESULT CALLED TO, READ BACK BY AND VERIFIED WITH: MELISSA COBB AT [phone] 05/15/20.PMF Performed at [facility], [address] [place], [place] [address]     Coagulation Studies: No results for input(s): LABPROT, INR in the last 72 hours.  Urinalysis: No results for input(s): COLORURINE, LABSPEC, PHURINE, GLUCOSEU, HGBUR, BILIRUBINUR, KETONESUR, PROTEINUR, UROBILINOGEN, NITRITE, LEUKOCYTESUR in the last 72 hours.  Invalid input(s): APPERANCEUR    Imaging: US RENAL  Result Date: 05/18/2020 CLINICAL DATA:  Acute kidney injury EXAM: RENAL / URINARY TRACT ULTRASOUND COMPLETE COMPARISON:  None. FINDINGS: Right Kidney: Renal measurements: 10.9 x 6.3 x 5.8 cm = volume: 206 mL. Mild diffuse thinning of renal cortex. No mass. No hydronephrosis. Left Kidney: Renal measurements: 10.9 x 5.3 x 4.5 cm = volume: 135 mL. Mild diffuse thinning of renal cortex. No hydronephrosis. 2.3 cm simple cyst seen in the mid left kidney. Bladder: Limited evaluation due to collapse configuration.  Foley in place. Other: None. IMPRESSION: Mild diffuse thinning of renal cortex consistent with chronic medical renal disease. Electronically Signed   By: Miachel Roux M.D.   On: 05/18/2020 10:32   DG Chest Port 1 View  Result Date: 05/19/2020 CLINICAL DATA:  Acute respiratory failure. EXAM: PORTABLE CHEST 1 VIEW  COMPARISON:  Chest x-ray 05/05/2020. FINDINGS: Limited exam, hemidiaphragms not completely imaged. Endotracheal tube, right IJ line in stable position. NG tube tip below the level of the image. Cardiac pacer in stable position. Cardiomegaly. No pulmonary venous congestion. Persistent left base infiltrate noted. No prominent pleural effusion. No pneumothorax. IMPRESSION: 1. Lines and tubes in stable position. 2. Cardiac pacer in stable position. Stable cardiomegaly. 3. Persistent left base infiltrate. Electronically Signed   By: Marcello Moores  Register   On: 05/19/2020 06:15     Medications:   . sodium chloride Stopped (05/13/20 0308)  . sodium chloride 5 mL/hr at 05/19/20 0600  .  ceFAZolin (ANCEF) IV 2 g (05/19/20 0753)  . dexmedetomidine (PRECEDEX) IV infusion    . feeding supplement (VITAL AF 1.2 CAL) 1,000 mL (05/04/2020 1353)  . fentaNYL infusion INTRAVENOUS Stopped (05/30/2020 1340)  . linezolid (ZYVOX) IV 600  mg (05/19/20 0955)  . pantoprozole (PROTONIX) infusion 8 mg/hr (05/19/20 0600)   . chlorhexidine gluconate (MEDLINE KIT)  15 mL Mouth Rinse BID  . Chlorhexidine Gluconate Cloth  6 each Topical Q0600  . feeding supplement (PROSource TF)  90 mL Per Tube TID  . free water  200 mL Per Tube Q4H  . insulin aspart  0-15 Units Subcutaneous Q4H  . insulin aspart  10 Units Subcutaneous Q4H  . insulin detemir  44 Units Subcutaneous Q12H  . mouth rinse  15 mL Mouth Rinse 10 times per day  . multivitamin with minerals  1 tablet Per Tube Daily  . mupirocin ointment  1 application Nasal BID  . [START ON 05/22/2020] pantoprazole  40 mg Intravenous Q12H   sodium chloride, acetaminophen (TYLENOL) oral liquid 160 mg/5 mL, acetaminophen, dextrose, docusate, polyethylene glycol  Assessment/ Plan:  75 y.o. male with past medical history of coronary artery disease, diabetes mellitus type 2, history of AICD placement who was admitted to [facility] on 05/30/2020 for evaluation of altered mental status.  He was  admitted on 05/11/2020 with DKA and altered mental status.  1.  Acute kidney injury, baseline creatinine currently unknown. 2.  Hypernatremia. 3.  Acute respiratory failure. 4.  MSSA bacteremia.  Plan: Overall renal function diminished but has been improving slowly.  Creatinine 4.69.  Urine output was good at 1.4 L.  Therefore no immediate need for dialysis at this time.  Okay to proceed with diuresis as being performed.  Overall prognosis remains guarded.   LOS: 7 Keithon Mccoin 1/19/202211:58 AM

## 2020-05-19 NOTE — Progress Notes (Signed)
Inpatient Diabetes Program Recommendations  AACE/ADA: New Consensus Statement on Inpatient Glycemic Control (2015)  Target Ranges:  Prepandial:   less than 140 mg/dL      Peak postprandial:   less than 180 mg/dL (1-2 hours)      Critically ill patients:  140 - 180 mg/dL   Lab Results  Component Value Date   GLUCAP 298 (H) 05/19/2020   HGBA1C 10.2 (H) 05/13/2020    Review of Glycemic Control Results for Billy Barton, Billy Barton (MRN [id number]) as of 05/19/2020 08:39  Ref. Range 05/18/2020 23:03 05/19/2020 03:11 05/19/2020 07:26  Glucose-Capillary Latest Ref Range: 70 - 99 mg/dL 003 (H) 704 (H) 888 (H)  Diabetes history:Unknown Outpatient Diabetes meds:Unknown Current orders: Levemir 38 units BID Novolog moderate Correction Scale/ SSI (0-15 units)Q 4hours                           Novolog 6 units q 4 hours  Inpatient Diabetes Program Recommendations:    Consider increasing Levemir to 44 units bid and increase Novolog tube feed coverage to 10 units q 4 hours.   Thanks  Beryl Meager, RN, BC-ADM Inpatient Diabetes Coordinator Pager [phone] (8a-5p)

## 2020-05-19 NOTE — Plan of Care (Signed)
Pt continues to be minimally responsive despite no sedation. MRI ordered but per radiology, unable to do d/t pacemaker. Discussed with Dr.Dewald, plan for CT tomorrow if still unable to get MRI tomorrow.   Problem: Education: Goal: Knowledge of General Education information will improve Description: Including pain rating scale, medication(s)/side effects and non-pharmacologic comfort measures Outcome: Not Progressing   Problem: Health Behavior/Discharge Planning: Goal: Ability to manage health-related needs will improve Outcome: Not Progressing   Problem: Clinical Measurements: Goal: Ability to maintain clinical measurements within normal limits will improve Outcome: Not Progressing Goal: Will remain free from infection Outcome: Not Progressing Goal: Diagnostic test results will improve Outcome: Not Progressing Goal: Respiratory complications will improve Outcome: Not Progressing Goal: Cardiovascular complication will be avoided Outcome: Not Progressing   Problem: Activity: Goal: Risk for activity intolerance will decrease Outcome: Not Progressing   Problem: Nutrition: Goal: Adequate nutrition will be maintained Outcome: Not Progressing   Problem: Coping: Goal: Level of anxiety will decrease Outcome: Not Progressing   Problem: Elimination: Goal: Will not experience complications related to bowel motility Outcome: Not Progressing Goal: Will not experience complications related to urinary retention Outcome: Not Progressing   Problem: Pain Managment: Goal: General experience of comfort will improve Outcome: Not Progressing   Problem: Safety: Goal: Ability to remain free from injury will improve Outcome: Not Progressing   Problem: Skin Integrity: Goal: Risk for impaired skin integrity will decrease Outcome: Not Progressing

## 2020-05-19 NOTE — Progress Notes (Signed)
Pt found on PRVC Vt500 RR 16 SPO2 30% +5. No documentation found in notes or flowsheet on time of change over from PS.

## 2020-05-19 NOTE — Progress Notes (Signed)
Patient had large black liquid stool, patient continued to have large amount of stool pouring out while attempting to clean patient up. Jeri Modena NP made aware, new orders received and carried out.

## 2020-05-19 NOTE — Progress Notes (Signed)
PHARMACY CONSULT NOTE - FOLLOW UP  Pharmacy Consult for Electrolyte Monitoring and Replacement   Recent Labs: Potassium (mmol/L)  Date Value  05/19/2020 3.2 (L)   Magnesium (mg/dL)  Date Value  83/35/8251 2.1   Calcium (mg/dL)  Date Value  89/84/2103 7.6 (L)   Albumin (g/dL)  Date Value  12/81/1886 1.6 (L)   Phosphorus (mg/dL)  Date Value  77/37/3668 4.2   Sodium (mmol/L)  Date Value  05/19/2020 148 (H)   Assessment: 75 year old male admitted with metabolic acidosis s/t DKA vs HHS. Code Blue called after arrival to ICU and patient intubated. Blood cultures positive for MSSA. Patient remains on insulin and bicarb drips. Pharmacy to manage electrolytes.  1/13 2245 Phos 2.5, Mg 1.8 (WNL)  Goal of Therapy:  Electrolytes WNL, K ~ 4  Plan:  Potassium low this morning. Patient also received diuresis with furosemide and metolazone. Will give potassium 40 mEq per tube. Continue to follow along.  Pricilla Riffle ,PharmD Clinical Pharmacist 05/19/2020 12:21 PM

## 2020-05-20 ENCOUNTER — Inpatient Hospital Stay: Payer: No Typology Code available for payment source

## 2020-05-20 DIAGNOSIS — Z95810 Presence of automatic (implantable) cardiac defibrillator: Secondary | ICD-10-CM | POA: Diagnosis not present

## 2020-05-20 DIAGNOSIS — A419 Sepsis, unspecified organism: Secondary | ICD-10-CM

## 2020-05-20 DIAGNOSIS — R7881 Bacteremia: Secondary | ICD-10-CM | POA: Diagnosis not present

## 2020-05-20 DIAGNOSIS — G9340 Encephalopathy, unspecified: Secondary | ICD-10-CM | POA: Diagnosis not present

## 2020-05-20 DIAGNOSIS — J15212 Pneumonia due to Methicillin resistant Staphylococcus aureus: Secondary | ICD-10-CM | POA: Diagnosis not present

## 2020-05-20 DIAGNOSIS — I4892 Unspecified atrial flutter: Secondary | ICD-10-CM | POA: Diagnosis not present

## 2020-05-20 DIAGNOSIS — N179 Acute kidney failure, unspecified: Secondary | ICD-10-CM | POA: Diagnosis not present

## 2020-05-20 DIAGNOSIS — J9601 Acute respiratory failure with hypoxia: Secondary | ICD-10-CM | POA: Diagnosis not present

## 2020-05-20 DIAGNOSIS — B9561 Methicillin susceptible Staphylococcus aureus infection as the cause of diseases classified elsewhere: Secondary | ICD-10-CM | POA: Diagnosis not present

## 2020-05-20 DIAGNOSIS — R4182 Altered mental status, unspecified: Secondary | ICD-10-CM | POA: Diagnosis not present

## 2020-05-20 LAB — CBC WITH DIFFERENTIAL/PLATELET
Abs Immature Granulocytes: 0.43 10*3/uL — ABNORMAL HIGH (ref 0.00–0.07)
Basophils Absolute: 0 10*3/uL (ref 0.0–0.1)
Basophils Relative: 0 %
Eosinophils Absolute: 0.9 10*3/uL — ABNORMAL HIGH (ref 0.0–0.5)
Eosinophils Relative: 7 %
HCT: 30.4 % — ABNORMAL LOW (ref 39.0–52.0)
Hemoglobin: 9.7 g/dL — ABNORMAL LOW (ref 13.0–17.0)
Immature Granulocytes: 3 %
Lymphocytes Relative: 13 %
Lymphs Abs: 1.7 10*3/uL (ref 0.7–4.0)
MCH: 29.9 pg (ref 26.0–34.0)
MCHC: 31.9 g/dL (ref 30.0–36.0)
MCV: 93.8 fL (ref 80.0–100.0)
Monocytes Absolute: 1.2 10*3/uL — ABNORMAL HIGH (ref 0.1–1.0)
Monocytes Relative: 9 %
Neutro Abs: 9.2 10*3/uL — ABNORMAL HIGH (ref 1.7–7.7)
Neutrophils Relative %: 68 %
Platelets: 259 10*3/uL (ref 150–400)
RBC: 3.24 MIL/uL — ABNORMAL LOW (ref 4.22–5.81)
RDW: 19 % — ABNORMAL HIGH (ref 11.5–15.5)
WBC: 13.4 10*3/uL — ABNORMAL HIGH (ref 4.0–10.5)
nRBC: 0.1 % (ref 0.0–0.2)

## 2020-05-20 LAB — GLUCOSE, CAPILLARY
Glucose-Capillary: 137 mg/dL — ABNORMAL HIGH (ref 70–99)
Glucose-Capillary: 175 mg/dL — ABNORMAL HIGH (ref 70–99)
Glucose-Capillary: 201 mg/dL — ABNORMAL HIGH (ref 70–99)
Glucose-Capillary: 211 mg/dL — ABNORMAL HIGH (ref 70–99)
Glucose-Capillary: 228 mg/dL — ABNORMAL HIGH (ref 70–99)
Glucose-Capillary: 251 mg/dL — ABNORMAL HIGH (ref 70–99)
Glucose-Capillary: 295 mg/dL — ABNORMAL HIGH (ref 70–99)

## 2020-05-20 LAB — HEMOGLOBIN AND HEMATOCRIT, BLOOD
HCT: 29.2 % — ABNORMAL LOW (ref 39.0–52.0)
Hemoglobin: 9.3 g/dL — ABNORMAL LOW (ref 13.0–17.0)

## 2020-05-20 LAB — BASIC METABOLIC PANEL
Anion gap: 10 (ref 5–15)
BUN: 92 mg/dL — ABNORMAL HIGH (ref 8–23)
CO2: 24 mmol/L (ref 22–32)
Calcium: 7.6 mg/dL — ABNORMAL LOW (ref 8.9–10.3)
Chloride: 116 mmol/L — ABNORMAL HIGH (ref 98–111)
Creatinine, Ser: 4.47 mg/dL — ABNORMAL HIGH (ref 0.61–1.24)
GFR, Estimated: 13 mL/min — ABNORMAL LOW (ref >60)
Glucose, Bld: 242 mg/dL — ABNORMAL HIGH (ref 70–99)
Potassium: 3.1 mmol/L — ABNORMAL LOW (ref 3.5–5.1)
Sodium: 150 mmol/L — ABNORMAL HIGH (ref 135–145)

## 2020-05-20 LAB — PHOSPHORUS: Phosphorus: 4.4 mg/dL (ref 2.5–4.6)

## 2020-05-20 LAB — MAGNESIUM: Magnesium: 2 mg/dL (ref 1.7–2.4)

## 2020-05-20 MED ORDER — MAGNESIUM SULFATE 2 GM/50ML IV SOLN
2.0000 g | Freq: Once | INTRAVENOUS | Status: AC
  Administered 2020-05-20: 2 g via INTRAVENOUS
  Filled 2020-05-20: qty 50

## 2020-05-20 MED ORDER — FUROSEMIDE 10 MG/ML IJ SOLN
40.0000 mg | Freq: Once | INTRAMUSCULAR | Status: AC
  Administered 2020-05-20: 40 mg via INTRAVENOUS
  Filled 2020-05-20: qty 4

## 2020-05-20 MED ORDER — POTASSIUM CHLORIDE 20 MEQ PO PACK
60.0000 meq | PACK | Freq: Two times a day (BID) | ORAL | Status: DC
  Administered 2020-05-20: 60 meq via ORAL
  Filled 2020-05-20: qty 3

## 2020-05-20 MED ORDER — METOLAZONE 5 MG PO TABS
5.0000 mg | ORAL_TABLET | Freq: Once | ORAL | Status: AC
  Administered 2020-05-20: 5 mg via ORAL
  Filled 2020-05-20: qty 1

## 2020-05-20 MED ORDER — INSULIN ASPART 100 UNIT/ML ~~LOC~~ SOLN
16.0000 [IU] | SUBCUTANEOUS | Status: DC
  Administered 2020-05-20 – 2020-05-22 (×12): 16 [IU] via SUBCUTANEOUS
  Filled 2020-05-20 (×12): qty 1

## 2020-05-20 MED ORDER — FREE WATER: 200.0000 mL | Freq: Four times a day (QID) | Status: DC

## 2020-05-20 MED ORDER — POTASSIUM CHLORIDE 20 MEQ PO PACK
60.0000 meq | PACK | Freq: Two times a day (BID) | ORAL | Status: AC
  Administered 2020-05-20: 60 meq
  Filled 2020-05-20: qty 3

## 2020-05-20 MED ORDER — PANTOPRAZOLE SODIUM 40 MG IV SOLR
40.0000 mg | Freq: Two times a day (BID) | INTRAVENOUS | Status: DC
  Administered 2020-05-20 – 2020-05-24 (×9): 40 mg via INTRAVENOUS
  Filled 2020-05-20 (×9): qty 40

## 2020-05-20 NOTE — Progress Notes (Signed)
NAME:  Billy Barton, MRN:  [id number], DOB:  1945-05-19, LOS: 8 ADMISSION DATE:  05/30/2020, CONSULTATION DATE:  05/08/2020 REFERRING MD:  Minna Antis, CHIEF COMPLAINT:  DKA, altered mental status   Brief History:  75yo male w/unknown past medical history who presented to the ED w/AMS and was found to be in DKA vs HSS with a blood sugar >600 and lactate 5.8  History of Present Illness:  75yo male w/unknown past medical history who presented to the ED via EMS after the son found him unresponsive, face down in the kitchen, incontinent of urine. Per the ED, the patient son reported to them they call and check on him daily and last spoke with him yesterday. When EMS arrived, the patient was awake but became unresponsive en route to the ED.   On arrival to the ED, the patient was arousable only to painful stimuli, hypothermic 94 otherwise stable vital signs. Labs revealed a blood glucose > 600, lactate 5.8, WBC 26.2, Hgb 7.1 and VBG: 7.19/36 and bicarb 13. He was given 1L LR bolus, started on endotool and antibiotics were initiated. Rapid COVID test negative  ICU was called for admission.   05/14/20 - patient with MSSA bacteremia, critially ill on mechanical ventilator.  Remains on vasopressor. Insulin gtt is off today.  Active GI bleed s/p PRBC transfusion. 05/15/20- patient remains hypotensive on MV.  Possible EGD when more stable - spoke with GI- Dr Lacretia Nicks today. Still with edema 2+ on vasopressors.  05/16/20- patient on MV.  A line not functioning will dc today.  Pt in sinus rhythm remains intubated septic awaiting tee for endocarditis workup.  May 31, 2020 - TEE performed at bedside.  05/18/20 - not waking up off sedation, nephrology consulted  Past Medical History:  Unknown  Significant Hospital Events:  1/13-Admitted to ICU  Consults:  GI Cardiology ID Nephrology  Procedures:  ETT 1/13>> A line 1/13 - 1/16 CVL 1/13>>  Significant Diagnostic Tests:  CT head 05/13/20 Stable non  contrast CT appearance of the brain. No CT evidence of anoxic injury. 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  TEE May 31, 2020 EF 40-50% RV systolic function normal No evidence of vegetation/infective endocarditis  Micro Data:  Bld Cx 05/26/2020 - MSSA Bld Cx 05/14/20 - Negative  Tracheal Aspirate 05/14/20 - MRSA  Antimicrobials:  Vancomycin 1/12 Cefepime 1/12  Flagyl 1/12 Zosyn 1/13  Cefazolin 1/13>>  Interim History / Subjective:  No acute events overnight. No changes in mental status.  Objective   Blood pressure (!) 166/82, pulse 98, temperature 99.5 F (37.5 C), resp. rate (!) 24, height 5\' 8"  (1.727 m), weight 107.8 kg, SpO2 90 %.    Vent Mode: PRVC FiO2 (%):  [28 %-30 %] 30 % Set Rate:  [16 bmp] 16 bmp Vt Set:  [500 mL] 500 mL PEEP:  [5 cmH20] 5 cmH20 Pressure Support:  [10 cmH20] 10 cmH20   Intake/Output Summary (Last 24 hours) at 05/20/2020 0723 Last data filed at 05/20/2020 0600 Gross per 24 hour  Intake 3785.39 ml  Output 3450 ml  Net 335.39 ml   Filed Weights   05/18/20 0500 05/19/20 0349 05/20/20 0407  Weight: 111.9 kg 108.4 kg 107.8 kg    Examination: General: intubated, no acute distress HENT: [place]/AT, PERRL, sclera anicteric, roving eyes Lungs: clear to auscultation throughout. No wheezing or rhonchi. Cardiovascular: RRR, s1s2, no murmur Abdomen: soft, non-distended, BS+ Extremities: 1+ edema in upper extremities, trace edema lower extremities, warm Neuro: withdraws to pain, no purposeful  movement  GU: Foley catheter in place  Resolved Hospital Problem list   DKA Shock  Assessment & Plan:   Acute Hypoxemic Respiratory Failure MRSA Pneumonia In setting of septic shock due to MSSA bacteremia and Cardiac Arrest - Barrier to extubation is mental status at this time  - Tracheal aspirate from 05/14/20 has grown MRSA, treating with linezolid  - VAP bundle ordered - Continue daily PSV trials  Acute Kidney Injury,  non-oliguric Hypernatremia Uremia In setting of septic shock - Unknown baseline creatinine and renal function improved slightly over past couple days - Stop free water flushes for now - Lasix + metolazone BID today - Monitor electrolytes - Nephrology following. Discussed potential for dialysis, will reassess tomorrow.  Encephalopathy In setting of shock, sepsis and possible uremia. Possibly medication related. - Continue to hold sedation - add precedex if becomes agitated - Spot EEG 1/19 negative for seizure activity - CT head 1/13 with no acute findings - Not able to obtain MRI due to ICD and lack of personnel to perform study safely here - Check Urine drug screen for lingering sedatives  MSSA Bacteremia - ID following, treating with cefazolin - TEE negative for vegetations  Acute Blood Loss Anemia In setting of acute GI bleeding secondary to sepsis - Last transfusion 05/14/20 - GI not planning to scope, has signed off - BID PPI dosing   Diabetes Mellitus  - SSI q4hrs - Levemir 44 units BID - TF coverage increased to 16 units q4hrs  Best practice (evaluated daily)  Diet: TF Pain/Anxiety/Delirium protocol (if indicated): Fentanyl, precedex VAP protocol (if indicated): ordered DVT prophylaxis: scds GI prophylaxis: Pantoprazole BID Glucose control: SSI, levemir Mobility: bed rest Disposition:ICU Code Status: Full Family update: 1/20 spoke with patient's son. All questions answered. If patient requires transfer for imaging, he asked if he could be transferred to the [place] in [place].  Labs   CBC: Recent Labs  Lab 05/14/20 2104 05/15/20 0558 06/03/20 0326 05/18/20 0407 05/18/20 1835 05/19/20 0355 05/19/20 1120 05/19/20 1724 05/19/20 2348 05/20/20 0631  WBC 13.6*   < > 12.8* 9.7 12.0* 11.5*  --   --   --  13.4*  NEUTROABS 10.8*  --   --   --   --  7.8*  --   --   --  9.2*  HGB 9.3*   < > 8.4* 8.1* 9.3* 9.0* 8.6* 9.7* 9.3* 9.7*  HCT 26.9*   < > 26.6* 25.6* 29.2*  29.2* 28.0* 31.3* 29.2* 30.4*  MCV 88.8   < > 94.0 93.8 93.9 94.8  --   --   --  93.8  PLT 108*   < > 123* 171 193 208  --   --   --  259   < > = values in this interval not displayed.    Basic Metabolic Panel: Recent Labs  Lab 05/16/20 0551 Jun 03, 2020 0326 06-03-20 1652 05/18/20 0407 05/18/20 1835 05/19/20 0355 05/19/20 1500 05/20/20 0631  NA 154* 156*  --  149*  --  148* 150* 150*  K 3.4* 3.6  --  3.6  --  3.2* 3.5 3.1*  CL 119* 120*  --  116*  --  116* 117* 116*  CO2 25 25  --  23  --  22 22 24   GLUCOSE 103* 178*  --  367*  --  383* 325* 242*  BUN 99* 96*  --  96*  --  95* 93* 92*  CREATININE 4.89* 5.04*  --  4.93*  --  4.69* 4.58* 4.47*  CALCIUM 8.2* 7.7*  --  7.5*  --  7.6* 7.6* 7.6*  MG 1.9 1.9  --  2.1  --  2.1  --  2.0  PHOS 2.3* 3.3 3.1 3.7 3.9 4.2  --  4.4   GFR: Estimated Creatinine Clearance: 17.3 mL/min (A) (by C-G formula based on SCr of 4.47 mg/dL (H)). Recent Labs  Lab 05/18/20 0407 05/18/20 1835 05/19/20 0355 05/20/20 0631  WBC 9.7 12.0* 11.5* 13.4*    Liver Function Tests: Recent Labs  Lab 05/14/20 0333 05/15/20 0558 05/16/20 0551 05/20/2020 0326 05/18/20 0407  AST 21 20 16 15  12*  ALT 10 6 <5 <5 <5  ALKPHOS 46 48 60 58 65  BILITOT 0.9 0.5 0.5 0.6 0.5  PROT 4.8* 4.8* 5.2* 5.1* 5.1*  ALBUMIN 2.1* 1.9* 1.8* 1.7* 1.6*   No results for input(s): LIPASE, AMYLASE in the last 168 hours. No results for input(s): AMMONIA in the last 168 hours.  ABG    Component Value Date/Time   PHART 7.40 05/14/2020 0333   PCO2ART 32 05/14/2020 0333   PO2ART 87 05/14/2020 0333   HCO3 19.8 (L) 05/14/2020 0333   ACIDBASEDEF 4.6 (H) 05/14/2020 0333   O2SAT 96.6 05/14/2020 0333     Coagulation Profile: No results for input(s): INR, PROTIME in the last 168 hours.  Cardiac Enzymes: No results for input(s): CKTOTAL, CKMB, CKMBINDEX, TROPONINI in the last 168 hours.  HbA1C: Hgb A1c MFr Bld  Date/Time Value Ref Range Status  05/13/2020 02:49 PM 10.2 (H) 4.8 -  5.6 % Final    Comment:    (NOTE) Pre diabetes:          5.7%-6.4%  Diabetes:              >6.4%  Glycemic control for   <7.0% adults with diabetes     CBG: Recent Labs  Lab 05/19/20 1126 05/19/20 1606 05/19/20 1953 05/20/20 0010 05/20/20 0355  GLUCAP 323* 300* 266* 295* 251*     Critical care time: 35 minutes     05/22/20, MD [facility] Office: [phone]   See Amion for Pager Details

## 2020-05-20 NOTE — Progress Notes (Signed)
Nutrition Follow-up  DOCUMENTATION CODES:   Obesity unspecified  INTERVENTION:  Continue Vital AF 1.2 Cal at 45 mL/hr (1080 mL goal daily volume) + PROSource TF 90 mL TID per tube. Provides 1536 kcal, 147 grams of protein, 875 mL H2O daily.  Continue MVI daily per tube.  NUTRITION DIAGNOSIS:   Inadequate oral intake related to inability to eat as evidenced by NPO status.  Ongoing.  GOAL:   Patient will meet greater than or equal to 90% of their needs  Met with TF regimen.  MONITOR:   Vent status,Labs,Weight trends,TF tolerance,I & O's  REASON FOR ASSESSMENT:   Ventilator,Consult Enteral/tube feeding initiation and management  ASSESSMENT:   75 year old male with unknown PMHx admitted with AMS found to be in DKA vs HHS, also with MSSA bacteremia.  1/13 intubated 1/17 TFs initiated  Patient is currently intubated on ventilator support MV: 13.7 L/min Temp (24hrs), Avg:99.7 F (37.6 C), Min:99.14 F (37.3 C), Max:100.04 F (37.8 C)  Medications reviewed and include: Novolog 0-15 units Q4hrs, Novolog 16 units Q4hrs, Levemir 44 units Q12hrs, MVI daily, Protonix, potassium chloride 60 mEQ BID per tube today, cefazolin, linezolid.  Labs reviewed: CBG 201-251, Sodium 150, Potassium 3.1, Chloride 116, BUN 92, Creatinine 4.47.  I/O: 3150 mL UOP yesterday (1.2 mL/kg/hr)  Weight trend: 107.8 kg on 1/20; -2.3 kg from 1/14 but wts fluctuating day by day so unsure how accurate scale is  Enteral Access: 16 Fr. OGT placed 1/17; terminates in mid stomach per abdominal x-ray 1/17  Discussed on rounds. Patient off sedation but does not follow commands. Plan for repeat CT head today.  Diet Order:   Diet Order            Diet NPO time specified  Diet effective midnight                EDUCATION NEEDS:   No education needs have been identified at this time  Skin:  Skin Assessment: Reviewed RN Assessment  Last BM:  05/20/2020 - medium type 7  Height:   Ht Readings  from Last 1 Encounters:  05/05/2020 $RemoveB'5\' 8"'sMspbYIT$  (1.727 m)   Weight:   Wt Readings from Last 1 Encounters:  05/20/20 107.8 kg   Ideal Body Weight:  70 kg  BMI:  Body mass index is 36.14 kg/m.  Estimated Nutritional Needs:   Kcal:  1211-1541 (11-14 kcal/kg)  Protein:  140 grams (2 grams/kg IBW)  Fluid:  2 L/day  Jacklynn Barnacle, MS, RD, LDN Pager number available on Amion

## 2020-05-20 NOTE — Plan of Care (Signed)
Head CT done today to evaluate for changes given sedation off 48 hours and remains minimally responsive. Opening eyes to pain, moving extremities minimally, slight withdrawal at best. Pt tolerating tube feeding, rectal tube in place for liquid diarrhea, still black in color from recent GIB -- H/H stable. CBG improved with current insulin regimen. Tolerating spontaneous with 10/5 pressure support most of day today and most of day yesterday. Son receiving updates over phone.   Problem: Education: Goal: Knowledge of General Education information will improve Description: Including pain rating scale, medication(s)/side effects and non-pharmacologic comfort measures 05/20/2020 1540 by Idelle Jo, RN Outcome: Not Progressing 05/20/2020 1525 by Idelle Jo, RN Outcome: Not Progressing   Problem: Health Behavior/Discharge Planning: Goal: Ability to manage health-related needs will improve 05/20/2020 1540 by Idelle Jo, RN Outcome: Not Progressing 05/20/2020 1525 by Idelle Jo, RN Outcome: Not Progressing   Problem: Clinical Measurements: Goal: Ability to maintain clinical measurements within normal limits will improve 05/20/2020 1540 by Idelle Jo, RN Outcome: Not Progressing 05/20/2020 1525 by Idelle Jo, RN Outcome: Not Progressing Goal: Diagnostic test results will improve 05/20/2020 1540 by Idelle Jo, RN Outcome: Not Progressing 05/20/2020 1525 by Idelle Jo, RN Outcome: Not Progressing Goal: Respiratory complications will improve 05/20/2020 1540 by Idelle Jo, RN Outcome: Not Progressing 05/20/2020 1525 by Idelle Jo, RN Outcome: Not Progressing   Problem: Activity: Goal: Risk for activity intolerance will decrease 05/20/2020 1540 by Idelle Jo, RN Outcome: Not Progressing 05/20/2020 1525 by Idelle Jo, RN Outcome: Not Progressing   Problem: Coping: Goal:  Level of anxiety will decrease 05/20/2020 1540 by Idelle Jo, RN Outcome: Not Progressing 05/20/2020 1525 by Idelle Jo, RN Outcome: Not Progressing   Problem: Pain Managment: Goal: General experience of comfort will improve 05/20/2020 1540 by Idelle Jo, RN Outcome: Not Progressing 05/20/2020 1525 by Idelle Jo, RN Outcome: Not Progressing   Problem: Safety: Goal: Ability to remain free from injury will improve 05/20/2020 1540 by Idelle Jo, RN Outcome: Not Progressing 05/20/2020 1525 by Idelle Jo, RN Outcome: Not Progressing   Problem: Skin Integrity: Goal: Risk for impaired skin integrity will decrease 05/20/2020 1540 by Idelle Jo, RN Outcome: Not Progressing 05/20/2020 1525 by Idelle Jo, RN Outcome: Not Progressing   Problem: Clinical Measurements: Goal: Will remain free from infection 05/20/2020 1540 by Idelle Jo, RN Outcome: Progressing 05/20/2020 1525 by Idelle Jo, RN Outcome: Not Progressing Goal: Cardiovascular complication will be avoided 05/20/2020 1540 by Idelle Jo, RN Outcome: Progressing 05/20/2020 1525 by Idelle Jo, RN Outcome: Not Progressing   Problem: Nutrition: Goal: Adequate nutrition will be maintained 05/20/2020 1540 by Idelle Jo, RN Outcome: Progressing 05/20/2020 1525 by Idelle Jo, RN Outcome: Not Progressing   Problem: Elimination: Goal: Will not experience complications related to bowel motility 05/20/2020 1540 by Idelle Jo, RN Outcome: Progressing 05/20/2020 1525 by Idelle Jo, RN Outcome: Not Progressing Goal: Will not experience complications related to urinary retention 05/20/2020 1540 by Idelle Jo, RN Outcome: Progressing 05/20/2020 1525 by Idelle Jo, RN Outcome: Not Progressing  05/20/2020 1525 by Idelle Jo, RN Outcome: Not  Progressing   Problem: Coping: Goal: Level of anxiety will decrease 05/20/2020 1540 by Idelle Jo, RN Outcome: Not Progressing 05/20/2020 1525 by Idelle Jo, RN Outcome: Not Progressing   Problem: Pain Managment: Goal: General experience of comfort will improve 05/20/2020 1540 by Idelle Jo, RN Outcome: Not Progressing 05/20/2020 1525 by Idelle Jo, RN Outcome: Not Progressing   Problem: Safety: Goal: Ability to remain free from injury will improve 05/20/2020 1540 by [place] Noori,  RN Outcome: Not Progressing 05/20/2020 1525 by Idelle Jo, RN Outcome: Not Progressing   Problem: Skin Integrity: Goal: Risk for impaired skin integrity will decrease 05/20/2020 1540 by Idelle Jo, RN Outcome: Not Progressing 05/20/2020 1525 by Idelle Jo, RN Outcome: Not Progressing

## 2020-05-20 NOTE — Progress Notes (Signed)
Progress Note  Patient Name: Billy Barton Date of Encounter: 05/20/2020  Primary Cardiologist: [place] intubated.  Maintaining sinus rhythm.  Inpatient Medications    Scheduled Meds: . chlorhexidine gluconate (MEDLINE KIT)  15 mL Mouth Rinse BID  . Chlorhexidine Gluconate Cloth  6 each Topical Q0600  . feeding supplement (PROSource TF)  90 mL Per Tube TID  . insulin aspart  0-15 Units Subcutaneous Q4H  . insulin aspart  16 Units Subcutaneous Q4H  . insulin detemir  44 Units Subcutaneous Q12H  . mouth rinse  15 mL Mouth Rinse 10 times per day  . multivitamin with minerals  1 tablet Per Tube Daily  . pantoprazole  40 mg Intravenous Q12H  . potassium chloride  60 mEq Per Tube BID   Continuous Infusions: . sodium chloride Stopped (05/13/20 0308)  . sodium chloride 5 mL/hr at 05/20/20 1214  .  ceFAZolin (ANCEF) IV Stopped (05/20/20 6203)  . dexmedetomidine (PRECEDEX) IV infusion    . feeding supplement (VITAL AF 1.2 CAL) 45 mL/hr at 05/20/20 0400  . linezolid (ZYVOX) IV Stopped (05/20/20 1030)   PRN Meds: sodium chloride, acetaminophen (TYLENOL) oral liquid 160 mg/5 mL, acetaminophen, dextrose, docusate, fentaNYL (SUBLIMAZE) injection, metoprolol tartrate, polyethylene glycol   Vital Signs    Vitals:   05/20/20 0900 05/20/20 1000 05/20/20 1100 05/20/20 1200  BP: (!) 155/85 (!) 159/88 (!) 161/84 (!) 155/81  Pulse: 95 96 98 98  Resp: (!) 24 (!) 23 (!) 25 (!) 21  Temp: 99.32 F (37.4 C) 99.14 F (37.3 C) 99.32 F (37.4 C) 99.5 F (37.5 C)  TempSrc:      SpO2: 92% 95% 90% 98%  Weight:      Height:        Intake/Output Summary (Last 24 hours) at 05/20/2020 1232 Last data filed at 05/20/2020 1214 Gross per 24 hour  Intake 3603.59 ml  Output 3550 ml  Net 53.59 ml   Filed Weights   05/18/20 0500 05/19/20 0349 05/20/20 0407  Weight: 111.9 kg 108.4 kg 107.8 kg    Physical Exam   GEN: Well nourished, well developed.  Remains intubated. HEENT:  Grossly normal.  Neck: Supple, no JVD, carotid bruits, or masses. Cardiac: RRR, no murmurs, rubs, or gallops. No clubbing, cyanosis, edema.  Radials 2+, DP/PT 2+ and equal bilaterally.  Respiratory:  Respirations regular and unlabored, diminished breath sounds bilaterally. GI: Soft, nontender, nondistended, BS + x 4. MS: no deformity or atrophy. Skin: warm and dry, no rash. Neuro: Flinches minimally to touch and repositioning. Psych: Intubated. Unresponsive.  Labs    Chemistry Recent Labs  Lab 05/16/20 0551 05/28/2020 0326 05/18/20 0407 05/19/20 0355 05/19/20 1500 05/20/20 0631  NA 154* 156* 149* 148* 150* 150*  K 3.4* 3.6 3.6 3.2* 3.5 3.1*  CL 119* 120* 116* 116* 117* 116*  CO2 $Re'25 25 23 22 22 24  'wvm$ GLUCOSE 103* 178* 367* 383* 325* 242*  BUN 99* 96* 96* 95* 93* 92*  CREATININE 4.89* 5.04* 4.93* 4.69* 4.58* 4.47*  CALCIUM 8.2* 7.7* 7.5* 7.6* 7.6* 7.6*  PROT 5.2* 5.1* 5.1*  --   --   --   ALBUMIN 1.8* 1.7* 1.6*  --   --   --   AST 16 15 12*  --   --   --   ALT <5 <5 <5  --   --   --   ALKPHOS 60 58 65  --   --   --   BILITOT  0.5 0.6 0.5  --   --   --   GFRNONAA 12* 11* 12* 12* 13* 13*  ANIONGAP $RemoveB'10 11 10 10 11 10     'bOodZWjs$ Hematology Recent Labs  Lab 05/18/20 1835 05/19/20 0355 05/19/20 1120 05/19/20 1724 05/19/20 2348 05/20/20 0631  WBC 12.0* 11.5*  --   --   --  13.4*  RBC 3.11* 3.08*  --   --   --  3.24*  HGB 9.3* 9.0*   < > 9.7* 9.3* 9.7*  HCT 29.2* 29.2*   < > 31.3* 29.2* 30.4*  MCV 93.9 94.8  --   --   --  93.8  MCH 29.9 29.2  --   --   --  29.9  MCHC 31.8 30.8  --   --   --  31.9  RDW 19.7* 19.5*  --   --   --  19.0*  PLT 193 208  --   --   --  259   < > = values in this interval not displayed.    Cardiac Enzymes  Recent Labs  Lab 05/22/2020 2222 05/13/20 0026 05/13/20 0218 05/13/20 0548  TROPONINIHS 50* 47* 52* 67*     Lipids  Lab Results  Component Value Date   TRIG 118 05/19/2020    HbA1c  Lab Results  Component Value Date   HGBA1C 10.2 (H)  05/13/2020    Radiology    EEG  Result Date: 05/19/2020 Lora Havens, MD     05/19/2020  3:04 PM Patient Name: Albertus Chiarelli MRN: [id number] Epilepsy Attending: Lora Havens Referring Physician/Provider: Dr. Roderic Palau devDewald Date: 05/19/2020 Duration: 24.08 mins Patient history: 75 year old male with altered mental status.  EEG to evaluate for seizures. Level of alertness: Comatose AEDs during EEG study: None Technical aspects: This EEG study was done with scalp electrodes positioned according to the 10-20 International system of electrode placement. Electrical activity was acquired at a sampling rate of $Remov'500Hz'NIVFEW$  and reviewed with a high frequency filter of $RemoveB'70Hz'NOAmCoZt$  and a low frequency filter of $RemoveB'1Hz'PlLtynsI$ . EEG data were recorded continuously and digitally stored. Description: EEG showed continuous generalized predominantly 6 to 7 Hz theta as well as intermittent 2 to 3 Hz generalized delta slowing. Hyperventilation and photic stimulation were not performed.   ABNORMALITY -Continuous slow, generalized IMPRESSION: This study is suggestive of severe diffuse encephalopathy, nonspecific etiology. No seizures or epileptiform discharges were seen throughout the recording. Lora Havens   DG Abd 1 View  Result Date: 05/08/2020 CLINICAL DATA:  Orogastric tube placement EXAM: ABDOMEN - 1 VIEW COMPARISON:  Portable exam 1105 hours compared to 05/13/2020 FINDINGS: Tip of orogastric tube projects over mid stomach. Nonobstructive bowel gas pattern. No acute osseous findings. IMPRESSION: Tip of orogastric tube projects over mid stomach. Electronically Signed   By: Lavonia Dana M.D.   On: 05/07/2020 11:21   US RENAL  Result Date: 05/18/2020 CLINICAL DATA:  Acute kidney injury EXAM: RENAL / URINARY TRACT ULTRASOUND COMPLETE COMPARISON:  None. FINDINGS: Right Kidney: Renal measurements: 10.9 x 6.3 x 5.8 cm = volume: 206 mL. Mild diffuse thinning of renal cortex. No mass. No hydronephrosis. Left Kidney: Renal measurements:  10.9 x 5.3 x 4.5 cm = volume: 135 mL. Mild diffuse thinning of renal cortex. No hydronephrosis. 2.3 cm simple cyst seen in the mid left kidney. Bladder: Limited evaluation due to collapse configuration.  Foley in place. Other: None. IMPRESSION: Mild diffuse thinning of renal cortex consistent with chronic medical renal disease. Electronically Signed  By: Sharen Heck  Mir M.D.   On: 05/18/2020 10:32   DG Chest Port 1 View  Result Date: 05/19/2020 CLINICAL DATA:  Acute respiratory failure. EXAM: PORTABLE CHEST 1 VIEW COMPARISON:  Chest x-ray 05/21/2020. FINDINGS: Limited exam, hemidiaphragms not completely imaged. Endotracheal tube, right IJ line in stable position. NG tube tip below the level of the image. Cardiac pacer in stable position. Cardiomegaly. No pulmonary venous congestion. Persistent left base infiltrate noted. No prominent pleural effusion. No pneumothorax. IMPRESSION: 1. Lines and tubes in stable position. 2. Cardiac pacer in stable position. Stable cardiomegaly. 3. Persistent left base infiltrate. Electronically Signed   By: Marcello Moores  Register   On: 05/19/2020 06:15   DG Chest Port 1 View  Result Date: 05/01/2020 CLINICAL DATA:  Acute respiratory failure EXAM: PORTABLE CHEST 1 VIEW COMPARISON:  May 15, 2020 FINDINGS: Evaluation is limited by patient positioning. Portions of the lower thorax were not visualized on this study. The support devices appear essentially stable. There are persistent bilateral airspace opacities with a dense retrocardiac opacity. There is no pneumothorax. No definite acute osseous abnormality. IMPRESSION: No significant interval change. Electronically Signed   By: Constance Holster M.D.   On: 05/30/2020 04:28   Telemetry    RSR, PVCs - Personally Reviewed  Cardiac Studies   2D Echocardiogram 1.14.2022   1. Left ventricular ejection fraction, by estimation, is 50 to 55%. The  left ventricle has low normal function. Left ventricular endocardial  border not  optimally defined to evaluate regional wall motion. There is  mild left ventricular hypertrophy. Left  ventricular diastolic parameters are indeterminate.   2. Right ventricular systolic function is normal. The right ventricular  size is normal. Tricuspid regurgitation signal is inadequate for assessing  PA pressure.   3. The mitral valve is normal in structure. No evidence of mitral valve  regurgitation. No evidence of mitral stenosis.   4. The aortic valve is normal in structure. Aortic valve regurgitation is  not visualized. No aortic stenosis is present.   5. Aortic dilatation noted. There is moderate dilatation of the ascending  aorta, measuring 45 mm.   6. The inferior vena cava is dilated in size with >50% respiratory  variability, suggesting right atrial pressure of 8 mmHg.   7. RV pacemaker lead is thickend. Can not rule out vegetation. Consider a  TEE   8. Challenging image quality  _____________  Transesophageal Echocardiogram 1.17.2022   1. Left ventricular ejection fraction, by estimation, is 45 to 50%. The  left ventricle has mildly decreased function. Left ventricular endocardial  border not optimally defined to evaluate regional wall motion.   2. Right ventricular systolic function is normal. The right ventricular  size is normal. Tricuspid regurgitation signal is inadequate for assessing  PA pressure.   3. No left atrial/left atrial appendage thrombus was detected.   4. The mitral valve is normal in structure. Mild mitral valve  regurgitation. No evidence of mitral stenosis.   5. The aortic valve is normal in structure. Aortic valve regurgitation is  not visualized. No aortic stenosis is present.  _____________    Patient Profile     75 y.o. male with hx of AICD placement, CAD/MI, insulin-dependent diabetes who was hospitalized with septic shock, MSSA bacteremia, acute blood loss anemia 2/2 GIB. Patient ws intubated for respiratory failure, hospital course  complicated by aflutter. TEE showed no vegetation. Hgb stable. Kidney function worsening and nephrology consulted.  Assessment & Plan    1.  Acute respiratory failure/MRSA pneumonia:  Patient remains intubated and off of sedation.  Management per critical care medicine and infectious disease.  2.  MSSA bacteremia/Septic shock: Off of pressors. No cardiac vegetation noted on TEE on January 17.  Reviewed with electrophysiology with recommendation for ongoing antibiotics and no need for ICD explant at this time though would consider for recurrent bacteremia in the future.  3.  Paroxysmal atrial flutter: Maintaining sinus rhythm.  No anticoagulation secondary to anemia.  Consider addition of beta-blocker therapy if hemodynamically stable.  4. Normocytic anemia: stable  5.  DKA: insulin mgmt per CCM.  6.  Acute kidney injury:  In setting of hypotension.  Diuretic management per nephrology.  7.  Hypokalemia: Supplementation ordered.  Signed, Murray Hodgkins, NP  05/20/2020, 12:32 PM    For questions or updates, please contact   Please consult [email or web address] for contact info under Cardiology/STEMI.

## 2020-05-20 NOTE — Progress Notes (Signed)
[facility]  ROUNDING NOTE   Subjective:   01/19 0701 - 01/20 0700 In: 3785.4 [I.V.:195.4; NG/GT:2790; IV EBXIDHWYS:168] Out: 3729 [Urine:3150; Stool:300] Patient with good urine output of 3.1 L over the preceding 24 hours. Creatinine currently 4.4 with an EGFR of 13 and BUN of 92. Still not following commands   Objective:  Vital signs in last 24 hours:  Temp:  [99.14 F (37.3 C)-100.04 F (37.8 C)] 99.5 F (37.5 C) (01/20 1200) Pulse Rate:  [91-111] 98 (01/20 1200) Resp:  [21-37] 21 (01/20 1200) BP: (134-190)/(72-112) 155/81 (01/20 1200) SpO2:  [90 %-98 %] 98 % (01/20 1200) FiO2 (%):  [28 %-30 %] 30 % (01/20 1200) Weight:  [107.8 kg] 107.8 kg (01/20 0407)  Weight change: -0.6 kg Filed Weights   05/18/20 0500 05/19/20 0349 05/20/20 0407  Weight: 111.9 kg 108.4 kg 107.8 kg    Intake/Output: I/O last 3 completed shifts: In: 4695.8 [I.V.:252.1; NG/GT:3150; IV Piggyback:1293.7] Out: 0211 [Urine:4575; DBZMC:802]   Intake/Output this shift:  Total I/O In: 1788.7 [I.V.:18.2; NG/GT:1320.5; IV Piggyback:450] Out: 1050 [Urine:1050]  Physical Exam: General:  Critically ill-appearing  Head:  Endotracheal tube in place  Eyes:  Anicteric  Neck:  Supple  Lungs:   Scattered rhonchi, vent assisted  Heart:  S1S2 no rubs  Abdomen:   Soft, nontender, bowel sounds present  Extremities:  1+ peripheral edema.  Neurologic:  Intubated  Skin:  No rash  Access:  No dialysis access    Basic Metabolic Panel: Recent Labs  Lab 05/16/20 0551 05/04/2020 0326 05/06/2020 1652 05/18/20 0407 05/18/20 1835 05/19/20 0355 05/19/20 1500 05/20/20 0631  NA 154* 156*  --  149*  --  148* 150* 150*  K 3.4* 3.6  --  3.6  --  3.2* 3.5 3.1*  CL 119* 120*  --  116*  --  116* 117* 116*  CO2 25 25  --  23  --  _0 GLUCOSE 103* 178*  --  367*  --  383* 325* 242*  BUN 99* 96*  --  96*  --  95* 93* 92*  CREATININE 4.89* 5.04*  --  4.93*  --  4.69* 4.58* 4.47*  CALCIUM 8.2* 7.7*  --   7.5*  --  7.6* 7.6* 7.6*  MG 1.9 1.9  --  2.1  --  2.1  --  2.0  PHOS 2.3* 3.3 3.1 3.7 3.9 4.2  --  4.4    Liver Function Tests: Recent Labs  Lab 05/14/20 0333 05/15/20 0558 05/16/20 0551 05/20/2020 0326 05/18/20 0407  AST _1 12*  ALT 10 6 <5 <5 <5  ALKPHOS 46 48 60 58 65  BILITOT 0.9 0.5 0.5 0.6 0.5  PROT 4.8* 4.8* 5.2* 5.1* 5.1*  ALBUMIN 2.1* 1.9* 1.8* 1.7* 1.6*   No results for input(s): LIPASE, AMYLASE in the last 168 hours. No results for input(s): AMMONIA in the last 168 hours.  CBC: Recent Labs  Lab 05/14/20 2104 05/15/20 0558 05/08/2020 0326 05/18/20 0407 05/18/20 1835 05/19/20 0355 05/19/20 1120 05/19/20 1724 05/19/20 2348 05/20/20 0631  WBC 13.6*   < > 12.8* 9.7 12.0* 11.5*  --   --   --  13.4*  NEUTROABS 10.8*  --   --   --   --  7.8*  --   --   --  9.2*  HGB 9.3*   < > 8.4* 8.1* 9.3* 9.0* 8.6* 9.7* 9.3* 9.7*  HCT 26.9*   < > 26.6* 25.6*  29.2* 29.2* 28.0* 31.3* 29.2* 30.4*  MCV 88.8   < > 94.0 93.8 93.9 94.8  --   --   --  93.8  PLT 108*   < > 123* 171 193 208  --   --   --  259   < > = values in this interval not displayed.    Cardiac Enzymes: No results for input(s): CKTOTAL, CKMB, CKMBINDEX, TROPONINI in the last 168 hours.  BNP: Invalid input(s): POCBNP  CBG: Recent Labs  Lab 05/19/20 1953 05/20/20 0010 05/20/20 0355 05/20/20 0740 05/20/20 1149  GLUCAP 266* 295* 251* 201* 211*    Microbiology: Results for orders placed or performed during the hospital encounter of 05/28/2020  Blood culture (routine single)     Status: Abnormal   Collection Time: 05/19/2020 10:24 PM   Specimen: BLOOD  Result Value Ref Range Status   Specimen Description   Final    BLOOD LEFT ASSIST CONTROL Performed at [facility], [address], [place], [place] [address]    Special Requests   Final    BOTTLES DRAWN AEROBIC AND ANAEROBIC Blood Culture adequate volume Performed at [facility], [address] [place], [place] [address]     Culture  Setup Time   Final    Organism ID to follow GRAM POSITIVE COCCI IN BOTH AEROBIC AND ANAEROBIC BOTTLES CRITICAL RESULT CALLED TO, READ BACK BY AND VERIFIED WITH: MYRA SLAUGHTER AT 1309 ON 05/13/20 SNG GRAM NEGATIVE RODS ANAEROBIC BOTTLE ONLY CRITICAL RESULT CALLED TO, READ BACK BY AND VERIFIED WITH: MORGAN HICKS PHARMD _0  05/14/20 EB Performed at [facility], [address] [place], [place] [address]    Culture STAPHYLOCOCCUS AUREUS PROTEUS MIRABILIS  (A)  Final   Report Status 05/16/2020 FINAL  Final   Organism ID, Bacteria STAPHYLOCOCCUS AUREUS  Final   Organism ID, Bacteria PROTEUS MIRABILIS  Final      Susceptibility   Proteus mirabilis - MIC*    AMPICILLIN <=2 SENSITIVE Sensitive     CEFAZOLIN <=4 SENSITIVE Sensitive     CEFEPIME <=0.12 SENSITIVE Sensitive     CEFTAZIDIME <=1 SENSITIVE Sensitive     CEFTRIAXONE <=0.25 SENSITIVE Sensitive     CIPROFLOXACIN <=0.25 SENSITIVE Sensitive     GENTAMICIN <=1 SENSITIVE Sensitive     IMIPENEM 8 INTERMEDIATE Intermediate     TRIMETH/SULFA <=20 SENSITIVE Sensitive     AMPICILLIN/SULBACTAM <=2 SENSITIVE Sensitive     PIP/TAZO <=4 SENSITIVE Sensitive     * PROTEUS MIRABILIS   Staphylococcus aureus - MIC*    CIPROFLOXACIN <=0.5 SENSITIVE Sensitive     ERYTHROMYCIN <=0.25 SENSITIVE Sensitive     GENTAMICIN <=0.5 SENSITIVE Sensitive     OXACILLIN 0.5 SENSITIVE Sensitive     TETRACYCLINE <=1 SENSITIVE Sensitive     VANCOMYCIN 1 SENSITIVE Sensitive     TRIMETH/SULFA <=10 SENSITIVE Sensitive     CLINDAMYCIN <=0.25 SENSITIVE Sensitive     RIFAMPIN <=0.5 SENSITIVE Sensitive     Inducible Clindamycin NEGATIVE Sensitive     * STAPHYLOCOCCUS AUREUS  Culture, blood (single)     Status: None   Collection Time: 05/16/2020 10:24 PM   Specimen: BLOOD  Result Value Ref Range Status   Specimen Description BLOOD LEFT HAND  Final   Special Requests   Final    BOTTLES DRAWN AEROBIC AND ANAEROBIC Blood Culture adequate volume    Culture   Final    NO GROWTH 5 DAYS Performed at [facility], [address],  [place], [place] [address]    Report Status 05/23/2020 FINAL  Final  Blood Culture ID Panel (Reflexed)     Status: Abnormal   Collection Time: 05/08/2020 10:24 PM  Result Value Ref Range Status   Enterococcus faecalis NOT DETECTED NOT DETECTED Final   Enterococcus Faecium NOT DETECTED NOT DETECTED Final   Listeria monocytogenes NOT DETECTED NOT DETECTED Final   Staphylococcus species DETECTED (A) NOT DETECTED Final    Comment: CRITICAL RESULT CALLED TO, READ BACK BY AND VERIFIED WITH: MYRA SLAUGHTER AT 1309 ON 05/13/20 SNG    Staphylococcus aureus (BCID) DETECTED (A) NOT DETECTED Final    Comment: CRITICAL RESULT CALLED TO, READ BACK BY AND VERIFIED WITH: MYRA SLAUGHTER AT 1309 05/13/20 SNG    Staphylococcus epidermidis NOT DETECTED NOT DETECTED Final   Staphylococcus lugdunensis NOT DETECTED NOT DETECTED Final   Streptococcus species NOT DETECTED NOT DETECTED Final   Streptococcus agalactiae NOT DETECTED NOT DETECTED Final   Streptococcus pneumoniae NOT DETECTED NOT DETECTED Final   Streptococcus pyogenes NOT DETECTED NOT DETECTED Final   A.calcoaceticus-baumannii NOT DETECTED NOT DETECTED Final   Bacteroides fragilis NOT DETECTED NOT DETECTED Final   Enterobacterales NOT DETECTED NOT DETECTED Final   Enterobacter cloacae complex NOT DETECTED NOT DETECTED Final   Escherichia coli NOT DETECTED NOT DETECTED Final   Klebsiella aerogenes NOT DETECTED NOT DETECTED Final   Klebsiella oxytoca NOT DETECTED NOT DETECTED Final   Klebsiella pneumoniae NOT DETECTED NOT DETECTED Final   Proteus species NOT DETECTED NOT DETECTED Final   Salmonella species NOT DETECTED NOT DETECTED Final   Serratia marcescens NOT DETECTED NOT DETECTED Final   Haemophilus influenzae NOT DETECTED NOT DETECTED Final   Neisseria meningitidis NOT DETECTED NOT DETECTED Final   Pseudomonas aeruginosa NOT DETECTED NOT DETECTED Final    Stenotrophomonas maltophilia NOT DETECTED NOT DETECTED Final   Candida albicans NOT DETECTED NOT DETECTED Final   Candida auris NOT DETECTED NOT DETECTED Final   Candida glabrata NOT DETECTED NOT DETECTED Final   Candida krusei NOT DETECTED NOT DETECTED Final   Candida parapsilosis NOT DETECTED NOT DETECTED Final   Candida tropicalis NOT DETECTED NOT DETECTED Final   Cryptococcus neoformans/gattii NOT DETECTED NOT DETECTED Final   Meth resistant mecA/C and MREJ NOT DETECTED NOT DETECTED Final    Comment: Performed at [facility], [address], [place], [place] [address]  Urine culture     Status: None   Collection Time: 05/13/20 12:26 AM   Specimen: In/Out Cath Urine  Result Value Ref Range Status   Specimen Description   Final    IN/OUT CATH URINE Performed at [facility], [address], [place], [place] [address]    Special Requests   Final    NONE Performed at [facility], [address], [place], [place] [address]    Culture   Final    NO GROWTH Performed at [facility], [place] [address], [place], [place] [address]    Report Status 05/14/2020 FINAL  Final  Resp Panel by RT-PCR (Flu A&B, Covid) Nasopharyngeal Swab     Status: None   Collection Time: 05/13/20 12:26 AM   Specimen: Nasopharyngeal Swab; Nasopharyngeal(NP) swabs in vial transport medium  Result Value Ref Range Status   SARS Coronavirus 2 by RT PCR NEGATIVE NEGATIVE Final    Comment: (NOTE) SARS-CoV-2 target nucleic acids are NOT DETECTED.  The SARS-CoV-2 RNA is generally detectable in upper respiratory specimens during the acute phase of infection. The lowest concentration  of SARS-CoV-2 viral copies this assay can detect is 138 copies/mL. A negative result does not preclude SARS-Cov-2 infection and should not be used as the sole basis for treatment or other patient management decisions. A negative result may occur with  improper specimen collection/handling,  submission of specimen other than nasopharyngeal swab, presence of viral mutation(s) within the areas targeted by this assay, and inadequate number of viral copies(<138 copies/mL). A negative result must be combined with clinical observations, patient history, and epidemiological information. The expected result is Negative.  Fact Sheet for Patients:  EntrepreneurPulse.com.au  Fact Sheet for Healthcare Providers:  IncredibleEmployment.be  This test is no t yet approved or cleared by the [place] FDA and  has been authorized for detection and/or diagnosis of SARS-CoV-2 by FDA under an Emergency Use Authorization (EUA). This EUA will remain  in effect (meaning this test can be used) for the duration of the COVID-19 declaration under Section 564(b)(1) of the Act, 21 U.S.C.section 360bbb-3(b)(1), unless the authorization is terminated  or revoked sooner.       Influenza A by PCR NEGATIVE NEGATIVE Final   Influenza B by PCR NEGATIVE NEGATIVE Final    Comment: (NOTE) The Xpert Xpress SARS-CoV-2/FLU/RSV plus assay is intended as an aid in the diagnosis of influenza from Nasopharyngeal swab specimens and should not be used as a sole basis for treatment. Nasal washings and aspirates are unacceptable for Xpert Xpress SARS-CoV-2/FLU/RSV testing.  Fact Sheet for Patients: EntrepreneurPulse.com.au  Fact Sheet for Healthcare Providers: IncredibleEmployment.be  This test is not yet approved or cleared by the [place] FDA and has been authorized for detection and/or diagnosis of SARS-CoV-2 by FDA under an Emergency Use Authorization (EUA). This EUA will remain in effect (meaning this test can be used) for the duration of the COVID-19 declaration under Section 564(b)(1) of the Act, 21 U.S.C. section 360bbb-3(b)(1), unless the authorization is terminated or revoked.  Performed at [facility], [address] [place], [place] [address]   Culture, blood (Routine X 2) w Reflex to ID Panel     Status: None   Collection Time: 05/14/20  2:41 PM   Specimen: BLOOD  Result Value Ref Range Status   Specimen Description BLOOD RIGHT ANTECUBITAL  Final   Special Requests   Final    BOTTLES DRAWN AEROBIC AND ANAEROBIC Blood Culture adequate volume   Culture   Final    NO GROWTH 5 DAYS Performed at [facility], [address] [place], [place] [address]    Report Status 05/19/2020 FINAL  Final  Culture, blood (Routine X 2) w Reflex to ID Panel     Status: None   Collection Time: 05/14/20  2:55 PM   Specimen: BLOOD  Result Value Ref Range Status   Specimen Description BLOOD BLOOD RIGHT HAND  Final   Special Requests   Final    BOTTLES DRAWN AEROBIC AND ANAEROBIC Blood Culture results may not be optimal due to an excessive volume of blood received in culture bottles   Culture   Final    NO GROWTH 5 DAYS Performed at [facility], [address] [place], [place] [address]    Report Status 05/19/2020 FINAL  Final  Culture, respiratory (non-expectorated)     Status: None   Collection Time: 05/14/20  5:34 PM   Specimen: Tracheal Aspirate; Respiratory  Result Value Ref Range Status   Specimen Description   Final    TRACHEAL ASPIRATE Performed at [facility], [place]  [address] [place], [place] [address]    Special Requests   Final    NONE Performed at [facility], [place], [place] [address]    Gram Stain   Final    MODERATE WBC PRESENT, PREDOMINANTLY PMN FEW GRAM POSITIVE COCCI IN CLUSTERS Performed at [facility], [place] [address] [place], [place] [address]    Culture RARE METHICILLIN RESISTANT STAPHYLOCOCCUS AUREUS  Final   Report Status 05/20/2020 FINAL  Final   Organism ID, Bacteria METHICILLIN RESISTANT STAPHYLOCOCCUS AUREUS  Final      Susceptibility   Methicillin resistant staphylococcus aureus - MIC*     CIPROFLOXACIN >=8 RESISTANT Resistant     ERYTHROMYCIN >=8 RESISTANT Resistant     GENTAMICIN <=0.5 SENSITIVE Sensitive     OXACILLIN >=4 RESISTANT Resistant     TETRACYCLINE <=1 SENSITIVE Sensitive     VANCOMYCIN <=0.5 SENSITIVE Sensitive     TRIMETH/SULFA <=10 SENSITIVE Sensitive     CLINDAMYCIN >=8 RESISTANT Resistant     RIFAMPIN <=0.5 SENSITIVE Sensitive     Inducible Clindamycin NEGATIVE Sensitive     * RARE METHICILLIN RESISTANT STAPHYLOCOCCUS AUREUS  MRSA PCR Screening     Status: Abnormal   Collection Time: 05/14/20  9:04 PM   Specimen: Nasopharyngeal  Result Value Ref Range Status   MRSA by PCR POSITIVE (A) NEGATIVE Final    Comment:        The GeneXpert MRSA Assay (FDA approved for NASAL specimens only), is one component of a comprehensive MRSA colonization surveillance program. It is not intended to diagnose MRSA infection nor to guide or monitor treatment for MRSA infections. RESULT CALLED TO, READ BACK BY AND VERIFIED WITH: MELISSA COBB AT [phone] 05/15/20.PMF Performed at [facility], [address] [place], [place] [address]     Coagulation Studies: No results for input(s): LABPROT, INR in the last 72 hours.  Urinalysis: No results for input(s): COLORURINE, LABSPEC, PHURINE, GLUCOSEU, HGBUR, BILIRUBINUR, KETONESUR, PROTEINUR, UROBILINOGEN, NITRITE, LEUKOCYTESUR in the last 72 hours.  Invalid input(s): APPERANCEUR    Imaging: EEG  Result Date: 05/19/2020 Lora Havens, MD     05/19/2020  3:04 PM Patient Name: Billy Barton MRN: [id number] Epilepsy Attending: Lora Havens Referring Physician/Provider: Dr. Roderic Palau devDewald Date: 05/19/2020 Duration: 24.08 mins Patient history: 75 year old male with altered mental status.  EEG to evaluate for seizures. Level of alertness: Comatose AEDs during EEG study: None Technical aspects: This EEG study was done with scalp electrodes positioned according to the 10-20 International system of electrode  placement. Electrical activity was acquired at a sampling rate of _0  and reviewed with a high frequency filter of _1  and a low frequency filter of _2 . EEG data were recorded continuously and digitally stored. Description: EEG showed continuous generalized predominantly 6 to 7 Hz theta as well as intermittent 2 to 3 Hz generalized delta slowing. Hyperventilation and photic stimulation were not performed.   ABNORMALITY -Continuous slow, generalized IMPRESSION: This study is suggestive of severe diffuse encephalopathy, nonspecific etiology. No seizures or epileptiform discharges were seen throughout the recording. Lora Havens   DG Chest Port 1 View  Result Date: 05/19/2020 CLINICAL DATA:  Acute respiratory failure. EXAM: PORTABLE CHEST 1 VIEW COMPARISON:  Chest x-ray 05/09/2020. FINDINGS: Limited exam, hemidiaphragms not completely imaged. Endotracheal tube, right IJ line in stable position. NG tube tip below the level of the image. Cardiac pacer in stable position. Cardiomegaly. No pulmonary venous congestion. Persistent left base infiltrate noted. No prominent pleural  effusion. No pneumothorax. IMPRESSION: 1. Lines and tubes in stable position. 2. Cardiac pacer in stable position. Stable cardiomegaly. 3. Persistent left base infiltrate. Electronically Signed   By: Marcello Moores  Register   On: 05/19/2020 06:15     Medications:   . sodium chloride Stopped (05/13/20 0308)  . sodium chloride 5 mL/hr at 05/20/20 1214  .  ceFAZolin (ANCEF) IV Stopped (05/20/20 1552)  . dexmedetomidine (PRECEDEX) IV infusion    . feeding supplement (VITAL AF 1.2 CAL) 45 mL/hr at 05/20/20 0400  . linezolid (ZYVOX) IV Stopped (05/20/20 1030)   . chlorhexidine gluconate (MEDLINE KIT)  15 mL Mouth Rinse BID  . Chlorhexidine Gluconate Cloth  6 each Topical Q0600  . feeding supplement (PROSource TF)  90 mL Per Tube TID  . insulin aspart  0-15 Units Subcutaneous Q4H  . insulin aspart  16 Units Subcutaneous Q4H  . insulin  detemir  44 Units Subcutaneous Q12H  . mouth rinse  15 mL Mouth Rinse 10 times per day  . multivitamin with minerals  1 tablet Per Tube Daily  . pantoprazole  40 mg Intravenous Q12H  . potassium chloride  60 mEq Per Tube BID   sodium chloride, acetaminophen (TYLENOL) oral liquid 160 mg/5 mL, acetaminophen, dextrose, docusate, fentaNYL (SUBLIMAZE) injection, metoprolol tartrate, polyethylene glycol  Assessment/ Plan:  75 y.o. male with past medical history of coronary artery disease, diabetes mellitus type 2, history of AICD placement who was admitted to [facility] on 05/11/2020 for evaluation of altered mental status.  He was admitted on 05/10/2020 with DKA and altered mental status.  1.  Acute kidney injury, baseline creatinine currently unknown. 2.  Hypernatremia. 3.  Acute respiratory failure. 4.  MSSA bacteremia.  Plan: Patient seen and evaluated at bedside. Patient has been receiving diuretics. Serum sodium noted to be high indicating free water deficit. However pulmonology recommending diuretics for pulmonary status. Patient with altered mental status and not following commands. Not currently on any sedation. BUN currently 92. We are considering renal replacement therapy however patient making plenty of urine at the moment. Could consider holding diuretics further and providing D5W which should help to treat the underlying azotemia as well. We will continue to monitor along with you.   LOS: 8 Oliveah Zwack 1/20/202212:36 PM

## 2020-05-20 NOTE — Progress Notes (Addendum)
Date of Admission:  05/25/2020      ID: Akshith Moncus is a 75 y.o. 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 OG tube placed on 05/18/2020. Endotracheal tube placed on 05/13/2020.  Subjective: Patient remains intubated. Off all sedation.  Unresponsive.  Medications:  . chlorhexidine gluconate (MEDLINE KIT)  15 mL Mouth Rinse BID  . Chlorhexidine Gluconate Cloth  6 each Topical Q0600  . feeding supplement (PROSource TF)  90 mL Per Tube TID  . insulin aspart  0-15 Units Subcutaneous Q4H  . insulin aspart  16 Units Subcutaneous Q4H  . insulin detemir  44 Units Subcutaneous Q12H  . mouth rinse  15 mL Mouth Rinse 10 times per day  . multivitamin with minerals  1 tablet Per Tube Daily  . pantoprazole  40 mg Intravenous Q12H  . potassium chloride  60 mEq Per Tube BID    Objective: Vital signs in last 24 hours: Temp:  [99.14 F (37.3 C)-99.86 F (37.7 C)] 99.32 F (37.4 C) (01/20 1400) Pulse Rate:  [91-111] 97 (01/20 1400) Resp:  [20-25] 23 (01/20 1400) BP: (134-190)/(72-112) 151/88 (01/20 1400) SpO2:  [90 %-98 %] 96 % (01/20 1400) FiO2 (%):  [30 %] 30 % (01/20 1200) Weight:  [107.8 kg] 107.8 kg (01/20 0407)  PHYSICAL EXAM:  General: Intubated, off all sedation, unresponsive.  Head: Normocephalic, without obvious abnormality, atraumatic. Eyes: Pupils small but reacting to light ENT cannot be examined Neck: Supple. Back: No CVA tenderness. Lungs: Clear to auscultation bilaterally. No Wheezing or Rhonchi. No rales. Heart: Regular rate and rhythm, no murmur, rub or gallop. Abdomen: Soft, non-tender,not distended. Bowel sounds normal. No  masses Extremities: atraumatic, no cyanosis. No edema. No clubbing Skin: No rashes or lesions. Or bruising Lymph: Cervical, supraclavicular normal. Neurologic: Grossly non-focal  Lab Results Recent Labs    05/19/20 0355 05/19/20 1120 05/19/20 1500 05/19/20 1724 05/19/20 2348 05/20/20 0631  WBC 11.5*  --   --   --   --  13.4*  HGB 9.0*   < >  --    < > 9.3* 9.7*  HCT 29.2*   < >  --    < > 29.2* 30.4*  NA 148*  --  150*  --   --  150*  K 3.2*  --  3.5  --   --  3.1*  CL 116*  --  117*  --   --  116*  CO2 22  --  22  --   --  24  BUN 95*  --  93*  --   --  92*  CREATININE 4.69*  --  4.58*  --   --  4.47*   < > = values in this interval not displayed.   Liver Panel Recent Labs    05/18/20 0407  PROT 5.1*  ALBUMIN 1.6*  AST 12*  ALT <5  ALKPHOS 65  BILITOT 0.5    Microbiology: 05/27/2020 Blood culture staph aureus and Proteus 104. 05/14/2020 blood culture no growth Urine culture no growth 05/14/2020 tracheal aspirate culture methicillin-resistant staph 40 years.  Studies/Results: EEG  Result Date: 05/19/2020 Lora Havens, MD     05/19/2020  3:04 PM Patient Name: Jamarea Selner MRN: [id number] Epilepsy Attending: Lora Havens Referring Physician/Provider: Dr. Roderic Palau devDewald Date: 05/19/2020 Duration: 24.08 mins Patient history: 75 year old male with altered mental status.  EEG to evaluate for seizures. Level of alertness: Comatose AEDs during EEG study: None Technical aspects: This EEG study was done with scalp electrodes positioned according to the 10-20 International system of electrode placement. Electrical activity was acquired at a sampling rate of _0  and reviewed with a high frequency filter of _1  and a low frequency filter of _2 . EEG data were recorded continuously and digitally stored. Description: EEG showed continuous generalized predominantly 6 to 7 Hz theta as well as intermittent 2 to 3 Hz generalized delta slowing. Hyperventilation and photic  stimulation were not performed.   ABNORMALITY -Continuous slow, generalized IMPRESSION: This study is suggestive of severe diffuse encephalopathy, nonspecific etiology. No seizures or epileptiform discharges were seen throughout the recording. Lora Havens   CT HEAD WO CONTRAST  Result Date: 05/20/2020 CLINICAL DATA:  Mental status change EXAM: CT HEAD WITHOUT CONTRAST TECHNIQUE: Contiguous axial images were obtained from the base of the skull through the vertex without intravenous contrast. COMPARISON:  CT head 05/13/2020 FINDINGS: Brain: Motion degraded study.  Multiple repeat images. Moderate atrophy. Extensive chronic microvascular ischemic change throughout the cerebral white matter. 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 Vascular: Negative for hyperdense vessel Skull: Negative Sinuses/Orbits: Mucosal edema paranasal sinuses. Air-fluid level right frontal sinus and right maxillary sinus Other: None IMPRESSION: Motion degraded study No acute abnormality Extensive chronic microvascular ischemic change. Electronically Signed   By: Franchot Gallo M.D.   On: 05/20/2020 13:08   DG Chest Port 1 View  Result Date: 05/19/2020 CLINICAL DATA:  Acute respiratory failure. EXAM: PORTABLE CHEST 1 VIEW COMPARISON:  Chest x-ray 05/22/2020. FINDINGS: Limited exam, hemidiaphragms not completely imaged. Endotracheal tube, right IJ line in stable position. NG tube tip below the level of the image. Cardiac pacer in stable position. Cardiomegaly. No pulmonary venous congestion. Persistent left base infiltrate noted. No prominent pleural effusion. No pneumothorax. IMPRESSION: 1. Lines and tubes in stable position. 2. Cardiac pacer in stable position. Stable cardiomegaly. 3. Persistent left base infiltrate. Electronically Signed   By: Marcello Moores  Register   On: 05/19/2020 06:15     Assessment/Plan: Acute encephalopathy and altered mental status on presentation due to DKA with hyponatremia.  CT of the head had no  acute findings repeat CT was also fine.  MRI MRV could not be done because of pacemaker.  EEG done  Uncontrolled diabetes mellitus presented with DKA, hyperkalemia and hyponatremia.  Cardiac arrest leading to resuscitation and intubation  Staph aureus bacteremia 1 out of 2 sets.  Proteus bacteremia 104.  Patient has a pacemaker.  TEE is negative.  Repeat blood culture from 05/14/2020 is negative.  Because it is of low bioburden and TEE no vegetation cardiology is planning to wait it out and observe.  We will treat patient for 3 to 4 weeks.  Then will observe him for a week to 10 days of antibiotic and repeat blood culture.  Patient currently on cefazolin and linezolid.  The latter is being used for MRSA pneumonia. As Proteus is being treated.  We can stop the cefazolin.  After another 5 days of linezolid we can switch him back to cefazolin to treat the staph aureus bacteremia for at least 3 weeks.  AKI.  Nonoliguric.  May need to get dialysis if he continues to be encephalopathy As he had been  on multiple CNS sedative agents which may linger in the system because of kidney injury.  Per  Anemia  patient had  black tarry stool.  Hemoglobin stable.  No GI intervention.   Discussed the management with the care team.

## 2020-05-20 NOTE — Progress Notes (Signed)
Pt was transported to CT from CCU and back while on the vent. 

## 2020-05-21 DIAGNOSIS — N179 Acute kidney failure, unspecified: Secondary | ICD-10-CM | POA: Diagnosis not present

## 2020-05-21 DIAGNOSIS — J9601 Acute respiratory failure with hypoxia: Secondary | ICD-10-CM | POA: Diagnosis not present

## 2020-05-21 DIAGNOSIS — R401 Stupor: Secondary | ICD-10-CM | POA: Diagnosis not present

## 2020-05-21 LAB — CBC WITH DIFFERENTIAL/PLATELET
Abs Immature Granulocytes: 0.32 10*3/uL — ABNORMAL HIGH (ref 0.00–0.07)
Basophils Absolute: 0 10*3/uL (ref 0.0–0.1)
Basophils Relative: 0 %
Eosinophils Absolute: 1 10*3/uL — ABNORMAL HIGH (ref 0.0–0.5)
Eosinophils Relative: 8 %
HCT: 29.3 % — ABNORMAL LOW (ref 39.0–52.0)
Hemoglobin: 9.2 g/dL — ABNORMAL LOW (ref 13.0–17.0)
Immature Granulocytes: 3 %
Lymphocytes Relative: 12 %
Lymphs Abs: 1.5 10*3/uL (ref 0.7–4.0)
MCH: 29.6 pg (ref 26.0–34.0)
MCHC: 31.4 g/dL (ref 30.0–36.0)
MCV: 94.2 fL (ref 80.0–100.0)
Monocytes Absolute: 1.1 10*3/uL — ABNORMAL HIGH (ref 0.1–1.0)
Monocytes Relative: 9 %
Neutro Abs: 8.4 10*3/uL — ABNORMAL HIGH (ref 1.7–7.7)
Neutrophils Relative %: 68 %
Platelets: 286 10*3/uL (ref 150–400)
RBC: 3.11 MIL/uL — ABNORMAL LOW (ref 4.22–5.81)
RDW: 19.1 % — ABNORMAL HIGH (ref 11.5–15.5)
WBC: 12.3 10*3/uL — ABNORMAL HIGH (ref 4.0–10.5)
nRBC: 0 % (ref 0.0–0.2)

## 2020-05-21 LAB — BASIC METABOLIC PANEL
Anion gap: 16 — ABNORMAL HIGH (ref 5–15)
BUN: 98 mg/dL — ABNORMAL HIGH (ref 8–23)
CO2: 21 mmol/L — ABNORMAL LOW (ref 22–32)
Calcium: 8.1 mg/dL — ABNORMAL LOW (ref 8.9–10.3)
Chloride: 114 mmol/L — ABNORMAL HIGH (ref 98–111)
Creatinine, Ser: 4.15 mg/dL — ABNORMAL HIGH (ref 0.61–1.24)
GFR, Estimated: 14 mL/min — ABNORMAL LOW (ref >60)
Glucose, Bld: 210 mg/dL — ABNORMAL HIGH (ref 70–99)
Potassium: 3.6 mmol/L (ref 3.5–5.1)
Sodium: 151 mmol/L — ABNORMAL HIGH (ref 135–145)

## 2020-05-21 LAB — GLUCOSE, CAPILLARY
Glucose-Capillary: 169 mg/dL — ABNORMAL HIGH (ref 70–99)
Glucose-Capillary: 174 mg/dL — ABNORMAL HIGH (ref 70–99)
Glucose-Capillary: 213 mg/dL — ABNORMAL HIGH (ref 70–99)
Glucose-Capillary: 178 mg/dL — ABNORMAL HIGH (ref 70–99)
Glucose-Capillary: 129 mg/dL — ABNORMAL HIGH (ref 70–99)

## 2020-05-21 LAB — MAGNESIUM: Magnesium: 2.5 mg/dL — ABNORMAL HIGH (ref 1.7–2.4)

## 2020-05-21 LAB — T4, FREE: Free T4: 0.74 ng/dL (ref 0.61–1.12)

## 2020-05-21 LAB — TSH: TSH: 1.06 u[IU]/mL (ref 0.350–4.500)

## 2020-05-21 LAB — PHOSPHORUS: Phosphorus: 5.2 mg/dL — ABNORMAL HIGH (ref 2.5–4.6)

## 2020-05-21 LAB — AMMONIA: Ammonia: 17 umol/L (ref 9–35)

## 2020-05-21 MED ORDER — CEFAZOLIN SODIUM-DEXTROSE 2-4 GM/100ML-% IV SOLN
2.0000 g | Freq: Two times a day (BID) | INTRAVENOUS | Status: AC
  Administered 2020-05-24 – 2020-05-26 (×6): 2 g via INTRAVENOUS
  Filled 2020-05-21 (×8): qty 100

## 2020-05-21 MED ORDER — ALBUMIN HUMAN 25 % IV SOLN
25.0000 g | Freq: Two times a day (BID) | INTRAVENOUS | Status: AC
  Administered 2020-05-21 – 2020-05-24 (×6): 25 g via INTRAVENOUS
  Filled 2020-05-21 (×6): qty 100

## 2020-05-21 MED ORDER — FREE WATER
200.0000 mL | Freq: Four times a day (QID) | Status: DC
  Administered 2020-05-21: 200 mL

## 2020-05-21 MED ORDER — FREE WATER
200.0000 mL | Status: DC
  Administered 2020-05-21 – 2020-05-27 (×35): 200 mL

## 2020-05-21 MED ORDER — THIAMINE HCL 100 MG/ML IJ SOLN
100.0000 mg | Freq: Every day | INTRAMUSCULAR | Status: DC
  Administered 2020-05-21 – 2020-05-28 (×8): 100 mg via INTRAVENOUS
  Filled 2020-05-21 (×8): qty 2

## 2020-05-21 NOTE — Progress Notes (Signed)
PHARMACY CONSULT NOTE - FOLLOW UP  Pharmacy Consult for Electrolyte Monitoring and Replacement   Recent Labs: Potassium (mmol/L)  Date Value  05/21/2020 3.6   Magnesium (mg/dL)  Date Value  23/53/6144 2.5 (H)   Calcium (mg/dL)  Date Value  31/54/0086 8.1 (L)   Albumin (g/dL)  Date Value  76/19/5093 1.6 (L)   Phosphorus (mg/dL)  Date Value  26/71/2458 5.2 (H)   Sodium (mmol/L)  Date Value  05/21/2020 151 (H)   Assessment: 75 year old male admitted with metabolic acidosis s/t DKA vs HHS. Code Blue called after arrival to ICU and patient intubated. Blood cultures positive for MSSA. Patient remains on insulin and bicarb drips. Pharmacy to manage electrolytes.  1/13 2245 Phos 2.5, Mg 1.8 (WNL)  Goal of Therapy:  Electrolytes WNL, K ~ 4  Plan:  Na elevated, restarting FW flushes. Electrolytes stable for now. Tolerating tube feeds. Creatinine improving. Continue to follow along.  Pricilla Riffle ,PharmD Clinical Pharmacist 05/21/2020 12:09 PM

## 2020-05-21 NOTE — Progress Notes (Signed)
[facility]  ROUNDING NOTE   Subjective:   01/20 0701 - 01/21 0700 In: 2318.4 [I.V.:18.4; NG/GT:1850; IV Piggyback:450] Out: 4656 [CLEXN:1700; Stool:100] Patient continues to have reasonable urine output of 1.5 L over the preceding 24 hours. Creatinine currently 4.15. Sodium still high at 151.   Objective:  Vital signs in last 24 hours:  Temp:  [99.32 F (37.4 C)-99.68 F (37.6 C)] 99.5 F (37.5 C) (01/21 0600) Pulse Rate:  [93-98] 94 (01/21 0600) Resp:  [20-24] 22 (01/21 0600) BP: (127-168)/(73-91) 128/77 (01/21 0600) SpO2:  [90 %-97 %] 96 % (01/21 0600) FiO2 (%):  [30 %] 30 % (01/21 0825) Weight:  [108.5 kg] 108.5 kg (01/21 0459)  Weight change: 0.7 kg Filed Weights   05/19/20 0349 05/20/20 0407 05/21/20 0459  Weight: 108.4 kg 107.8 kg 108.5 kg    Intake/Output: I/O last 3 completed shifts: In: 3264 [I.V.:69; NG/GT:2345; IV Piggyback:850] Out: 1749 [Urine:3185; Stool:200]   Intake/Output this shift:  No intake/output data recorded.  Physical Exam: General:  Critically ill-appearing  Head:  Endotracheal tube in place  Eyes:  Anicteric  Neck:  Supple  Lungs:   Scattered rhonchi, vent assisted  Heart:  S1S2 no rubs  Abdomen:   Soft, nontender, bowel sounds present  Extremities:  1+ peripheral edema.  Neurologic:  Intubated  Skin:  No rash  Access:  No dialysis access    Basic Metabolic Panel: Recent Labs  Lab 05/14/2020 0326 05/24/2020 1652 05/18/20 0407 05/18/20 1835 05/19/20 0355 05/19/20 1500 05/20/20 0631 05/21/20 0456  NA 156*  --  149*  --  148* 150* 150* 151*  K 3.6  --  3.6  --  3.2* 3.5 3.1* 3.6  CL 120*  --  116*  --  116* 117* 116* 114*  CO2 25  --  23  --  _0 21*  GLUCOSE 178*  --  367*  --  383* 325* 242* 210*  BUN 96*  --  96*  --  95* 93* 92* 98*  CREATININE 5.04*  --  4.93*  --  4.69* 4.58* 4.47* 4.15*  CALCIUM 7.7*  --  7.5*  --  7.6* 7.6* 7.6* 8.1*  MG 1.9  --  2.1  --  2.1  --  2.0 2.5*  PHOS 3.3   < > 3.7 3.9  4.2  --  4.4 5.2*   < > = values in this interval not displayed.    Liver Function Tests: Recent Labs  Lab 05/15/20 0558 05/16/20 0551 05/09/2020 0326 05/18/20 0407  AST _1 12*  ALT 6 <5 <5 <5  ALKPHOS 48 60 58 65  BILITOT 0.5 0.5 0.6 0.5  PROT 4.8* 5.2* 5.1* 5.1*  ALBUMIN 1.9* 1.8* 1.7* 1.6*   No results for input(s): LIPASE, AMYLASE in the last 168 hours. Recent Labs  Lab 05/21/20 1207  AMMONIA 17    CBC: Recent Labs  Lab 05/14/20 2104 05/15/20 0558 05/18/20 0407 05/18/20 1835 05/19/20 0355 05/19/20 1120 05/19/20 1724 05/19/20 2348 05/20/20 0631 05/21/20 0456  WBC 13.6*   < > 9.7 12.0* 11.5*  --   --   --  13.4* 12.3*  NEUTROABS 10.8*  --   --   --  7.8*  --   --   --  9.2* 8.4*  HGB 9.3*   < > 8.1* 9.3* 9.0* 8.6* 9.7* 9.3* 9.7* 9.2*  HCT 26.9*   < > 25.6* 29.2* 29.2* 28.0* 31.3* 29.2* 30.4* 29.3*  MCV 88.8   < >  93.8 93.9 94.8  --   --   --  93.8 94.2  PLT 108*   < > 171 193 208  --   --   --  259 286   < > = values in this interval not displayed.    Cardiac Enzymes: No results for input(s): CKTOTAL, CKMB, CKMBINDEX, TROPONINI in the last 168 hours.  BNP: Invalid input(s): POCBNP  CBG: Recent Labs  Lab 05/20/20 1952 05/20/20 2354 05/21/20 0405 05/21/20 0745 05/21/20 1110  GLUCAP 175* 228* 169* 174* 36*    Microbiology: Results for orders placed or performed during the hospital encounter of 05/21/2020  Blood culture (routine single)     Status: Abnormal   Collection Time: 05/10/2020 10:24 PM   Specimen: BLOOD  Result Value Ref Range Status   Specimen Description   Final    BLOOD LEFT ASSIST CONTROL Performed at [facility], [address] [place], [place] [address]    Special Requests   Final    BOTTLES DRAWN AEROBIC AND ANAEROBIC Blood Culture adequate volume Performed at [facility], [address] [place], [place] [address]    Culture  Setup Time   Final    Organism ID to follow GRAM POSITIVE COCCI IN BOTH  AEROBIC AND ANAEROBIC BOTTLES CRITICAL RESULT CALLED TO, READ BACK BY AND VERIFIED WITH: MYRA SLAUGHTER AT 1309 ON 05/13/20 SNG GRAM NEGATIVE RODS ANAEROBIC BOTTLE ONLY CRITICAL RESULT CALLED TO, READ BACK BY AND VERIFIED WITH: MORGAN HICKS PHARMD _0  05/14/20 EB Performed at [facility], [place] [address], [place], [place] [address]    Culture STAPHYLOCOCCUS AUREUS PROTEUS MIRABILIS  (A)  Final   Report Status 05/16/2020 FINAL  Final   Organism ID, Bacteria STAPHYLOCOCCUS AUREUS  Final   Organism ID, Bacteria PROTEUS MIRABILIS  Final      Susceptibility   Proteus mirabilis - MIC*    AMPICILLIN <=2 SENSITIVE Sensitive     CEFAZOLIN <=4 SENSITIVE Sensitive     CEFEPIME <=0.12 SENSITIVE Sensitive     CEFTAZIDIME <=1 SENSITIVE Sensitive     CEFTRIAXONE <=0.25 SENSITIVE Sensitive     CIPROFLOXACIN <=0.25 SENSITIVE Sensitive     GENTAMICIN <=1 SENSITIVE Sensitive     IMIPENEM 8 INTERMEDIATE Intermediate     TRIMETH/SULFA <=20 SENSITIVE Sensitive     AMPICILLIN/SULBACTAM <=2 SENSITIVE Sensitive     PIP/TAZO <=4 SENSITIVE Sensitive     * PROTEUS MIRABILIS   Staphylococcus aureus - MIC*    CIPROFLOXACIN <=0.5 SENSITIVE Sensitive     ERYTHROMYCIN <=0.25 SENSITIVE Sensitive     GENTAMICIN <=0.5 SENSITIVE Sensitive     OXACILLIN 0.5 SENSITIVE Sensitive     TETRACYCLINE <=1 SENSITIVE Sensitive     VANCOMYCIN 1 SENSITIVE Sensitive     TRIMETH/SULFA <=10 SENSITIVE Sensitive     CLINDAMYCIN <=0.25 SENSITIVE Sensitive     RIFAMPIN <=0.5 SENSITIVE Sensitive     Inducible Clindamycin NEGATIVE Sensitive     * STAPHYLOCOCCUS AUREUS  Culture, blood (single)     Status: None   Collection Time: 05/15/2020 10:24 PM   Specimen: BLOOD  Result Value Ref Range Status   Specimen Description BLOOD LEFT HAND  Final   Special Requests   Final    BOTTLES DRAWN AEROBIC AND ANAEROBIC Blood Culture adequate volume   Culture   Final    NO GROWTH 5 DAYS Performed at [facility], [address], [place],  [address]    Report Status 05/26/2020 FINAL  Final  Blood Culture ID Panel (Reflexed)     Status: Abnormal   Collection Time: 05/20/2020 10:24 PM  Result Value Ref Range Status   Enterococcus faecalis NOT DETECTED NOT DETECTED Final   Enterococcus Faecium NOT DETECTED NOT DETECTED Final   Listeria monocytogenes NOT DETECTED NOT DETECTED Final   Staphylococcus species DETECTED (A) NOT DETECTED Final    Comment: CRITICAL RESULT CALLED TO, READ BACK BY AND VERIFIED WITH: MYRA SLAUGHTER AT 1309 ON 05/13/20 SNG    Staphylococcus aureus (BCID) DETECTED (A) NOT DETECTED Final    Comment: CRITICAL RESULT CALLED TO, READ BACK BY AND VERIFIED WITH: MYRA SLAUGHTER AT 1309 05/13/20 SNG    Staphylococcus epidermidis NOT DETECTED NOT DETECTED Final   Staphylococcus lugdunensis NOT DETECTED NOT DETECTED Final   Streptococcus species NOT DETECTED NOT DETECTED Final   Streptococcus agalactiae NOT DETECTED NOT DETECTED Final   Streptococcus pneumoniae NOT DETECTED NOT DETECTED Final   Streptococcus pyogenes NOT DETECTED NOT DETECTED Final   A.calcoaceticus-baumannii NOT DETECTED NOT DETECTED Final   Bacteroides fragilis NOT DETECTED NOT DETECTED Final   Enterobacterales NOT DETECTED NOT DETECTED Final   Enterobacter cloacae complex NOT DETECTED NOT DETECTED Final   Escherichia coli NOT DETECTED NOT DETECTED Final   Klebsiella aerogenes NOT DETECTED NOT DETECTED Final   Klebsiella oxytoca NOT DETECTED NOT DETECTED Final   Klebsiella pneumoniae NOT DETECTED NOT DETECTED Final   Proteus species NOT DETECTED NOT DETECTED Final   Salmonella species NOT DETECTED NOT DETECTED Final   Serratia marcescens NOT DETECTED NOT DETECTED Final   Haemophilus influenzae NOT DETECTED NOT DETECTED Final   Neisseria meningitidis NOT DETECTED NOT DETECTED Final   Pseudomonas aeruginosa NOT DETECTED NOT DETECTED Final   Stenotrophomonas maltophilia NOT DETECTED NOT DETECTED Final   Candida albicans NOT  DETECTED NOT DETECTED Final   Candida auris NOT DETECTED NOT DETECTED Final   Candida glabrata NOT DETECTED NOT DETECTED Final   Candida krusei NOT DETECTED NOT DETECTED Final   Candida parapsilosis NOT DETECTED NOT DETECTED Final   Candida tropicalis NOT DETECTED NOT DETECTED Final   Cryptococcus neoformans/gattii NOT DETECTED NOT DETECTED Final   Meth resistant mecA/C and MREJ NOT DETECTED NOT DETECTED Final    Comment: Performed at [facility], [address] [place], [place] [address]  Urine culture     Status: None   Collection Time: 05/13/20 12:26 AM   Specimen: In/Out Cath Urine  Result Value Ref Range Status   Specimen Description   Final    IN/OUT CATH URINE Performed at [facility], [address], [place], [place] [address]    Special Requests   Final    NONE Performed at [facility], [address] [place], [place] [address]    Culture   Final    NO GROWTH Performed at [facility], [place] [address], [place], [place] [address]    Report Status 05/14/2020 FINAL  Final  Resp Panel by RT-PCR (Flu A&B, Covid) Nasopharyngeal Swab     Status: None   Collection Time: 05/13/20 12:26 AM   Specimen: Nasopharyngeal Swab; Nasopharyngeal(NP) swabs in vial transport medium  Result Value Ref Range Status   SARS Coronavirus 2 by RT PCR NEGATIVE NEGATIVE Final    Comment: (NOTE) SARS-CoV-2 target nucleic acids are NOT DETECTED.  The SARS-CoV-2 RNA is generally detectable in upper respiratory specimens during the acute phase of infection. The lowest concentration of SARS-CoV-2 viral copies this assay can detect is 138 copies/mL. A negative  result does not preclude SARS-Cov-2 infection and should not be used as the sole basis for treatment or other patient management decisions. A negative result may occur with  improper specimen collection/handling, submission of specimen other than nasopharyngeal swab, presence of viral mutation(s) within  the areas targeted by this assay, and inadequate number of viral copies(<138 copies/mL). A negative result must be combined with clinical observations, patient history, and epidemiological information. The expected result is Negative.  Fact Sheet for Patients:  EntrepreneurPulse.com.au  Fact Sheet for Healthcare Providers:  IncredibleEmployment.be  This test is no t yet approved or cleared by the [place] FDA and  has been authorized for detection and/or diagnosis of SARS-CoV-2 by FDA under an Emergency Use Authorization (EUA). This EUA will remain  in effect (meaning this test can be used) for the duration of the COVID-19 declaration under Section 564(b)(1) of the Act, 21 U.S.C.section 360bbb-3(b)(1), unless the authorization is terminated  or revoked sooner.       Influenza A by PCR NEGATIVE NEGATIVE Final   Influenza B by PCR NEGATIVE NEGATIVE Final    Comment: (NOTE) The Xpert Xpress SARS-CoV-2/FLU/RSV plus assay is intended as an aid in the diagnosis of influenza from Nasopharyngeal swab specimens and should not be used as a sole basis for treatment. Nasal washings and aspirates are unacceptable for Xpert Xpress SARS-CoV-2/FLU/RSV testing.  Fact Sheet for Patients: EntrepreneurPulse.com.au  Fact Sheet for Healthcare Providers: IncredibleEmployment.be  This test is not yet approved or cleared by the [place] FDA and has been authorized for detection and/or diagnosis of SARS-CoV-2 by FDA under an Emergency Use Authorization (EUA). This EUA will remain in effect (meaning this test can be used) for the duration of the COVID-19 declaration under Section 564(b)(1) of the Act, 21 U.S.C. section 360bbb-3(b)(1), unless the authorization is terminated or revoked.  Performed at [facility], [address] [place], [place] [address]   Culture, blood (Routine X 2) w Reflex to ID Panel      Status: None   Collection Time: 05/14/20  2:41 PM   Specimen: BLOOD  Result Value Ref Range Status   Specimen Description BLOOD RIGHT ANTECUBITAL  Final   Special Requests   Final    BOTTLES DRAWN AEROBIC AND ANAEROBIC Blood Culture adequate volume   Culture   Final    NO GROWTH 5 DAYS Performed at [facility], [address] [place], [place] [address]    Report Status 05/19/2020 FINAL  Final  Culture, blood (Routine X 2) w Reflex to ID Panel     Status: None   Collection Time: 05/14/20  2:55 PM   Specimen: BLOOD  Result Value Ref Range Status   Specimen Description BLOOD BLOOD RIGHT HAND  Final   Special Requests   Final    BOTTLES DRAWN AEROBIC AND ANAEROBIC Blood Culture results may not be optimal due to an excessive volume of blood received in culture bottles   Culture   Final    NO GROWTH 5 DAYS Performed at [facility], [address], [place], [place] [address]    Report Status 05/19/2020 FINAL  Final  Culture, respiratory (non-expectorated)     Status: None   Collection Time: 05/14/20  5:34 PM   Specimen: Tracheal Aspirate; Respiratory  Result Value Ref Range Status   Specimen Description   Final    TRACHEAL ASPIRATE Performed at [facility], [address], [place], [place] [address]    Special Requests   Final  NONE Performed at [facility], [place], [place] [address]    Gram Stain   Final    MODERATE WBC PRESENT, PREDOMINANTLY PMN FEW GRAM POSITIVE COCCI IN CLUSTERS Performed at [facility], [place] [address], [place], [place] [address]    Culture RARE METHICILLIN RESISTANT STAPHYLOCOCCUS AUREUS  Final   Report Status 05/26/2020 FINAL  Final   Organism ID, Bacteria METHICILLIN RESISTANT STAPHYLOCOCCUS AUREUS  Final      Susceptibility   Methicillin resistant staphylococcus aureus - MIC*    CIPROFLOXACIN >=8 RESISTANT Resistant     ERYTHROMYCIN >=8 RESISTANT Resistant     GENTAMICIN <=0.5  SENSITIVE Sensitive     OXACILLIN >=4 RESISTANT Resistant     TETRACYCLINE <=1 SENSITIVE Sensitive     VANCOMYCIN <=0.5 SENSITIVE Sensitive     TRIMETH/SULFA <=10 SENSITIVE Sensitive     CLINDAMYCIN >=8 RESISTANT Resistant     RIFAMPIN <=0.5 SENSITIVE Sensitive     Inducible Clindamycin NEGATIVE Sensitive     * RARE METHICILLIN RESISTANT STAPHYLOCOCCUS AUREUS  MRSA PCR Screening     Status: Abnormal   Collection Time: 05/14/20  9:04 PM   Specimen: Nasopharyngeal  Result Value Ref Range Status   MRSA by PCR POSITIVE (A) NEGATIVE Final    Comment:        The GeneXpert MRSA Assay (FDA approved for NASAL specimens only), is one component of a comprehensive MRSA colonization surveillance program. It is not intended to diagnose MRSA infection nor to guide or monitor treatment for MRSA infections. RESULT CALLED TO, READ BACK BY AND VERIFIED WITH: MELISSA COBB AT [phone] 05/15/20.PMF Performed at [facility], [address] [place], [place] [address]     Coagulation Studies: No results for input(s): LABPROT, INR in the last 72 hours.  Urinalysis: No results for input(s): COLORURINE, LABSPEC, PHURINE, GLUCOSEU, HGBUR, BILIRUBINUR, KETONESUR, PROTEINUR, UROBILINOGEN, NITRITE, LEUKOCYTESUR in the last 72 hours.  Invalid input(s): APPERANCEUR    Imaging: EEG  Result Date: 05/19/2020 Lora Havens, MD     05/19/2020  3:04 PM Patient Name: Billy Barton MRN: [id number] Epilepsy Attending: Lora Havens Referring Physician/Provider: Dr. Roderic Palau devDewald Date: 05/19/2020 Duration: 24.08 mins Patient history: 75 year old male with altered mental status.  EEG to evaluate for seizures. Level of alertness: Comatose AEDs during EEG study: None Technical aspects: This EEG study was done with scalp electrodes positioned according to the 10-20 International system of electrode placement. Electrical activity was acquired at a sampling rate of _0  and reviewed with a high frequency  filter of _1  and a low frequency filter of _2 . EEG data were recorded continuously and digitally stored. Description: EEG showed continuous generalized predominantly 6 to 7 Hz theta as well as intermittent 2 to 3 Hz generalized delta slowing. Hyperventilation and photic stimulation were not performed.   ABNORMALITY -Continuous slow, generalized IMPRESSION: This study is suggestive of severe diffuse encephalopathy, nonspecific etiology. No seizures or epileptiform discharges were seen throughout the recording. Lora Havens   CT HEAD WO CONTRAST  Result Date: 05/20/2020 CLINICAL DATA:  Mental status change EXAM: CT HEAD WITHOUT CONTRAST TECHNIQUE: Contiguous axial images were obtained from the base of the skull through the vertex without intravenous contrast. COMPARISON:  CT head 05/13/2020 FINDINGS: Brain: Motion degraded study.  Multiple repeat images. Moderate atrophy. Extensive chronic microvascular ischemic change throughout the cerebral white matter. 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 Vascular: Negative for hyperdense vessel Skull: Negative Sinuses/Orbits: Mucosal edema paranasal sinuses. Air-fluid  level right frontal sinus and right maxillary sinus Other: None IMPRESSION: Motion degraded study No acute abnormality Extensive chronic microvascular ischemic change. Electronically Signed   By: Franchot Gallo M.D.   On: 05/20/2020 13:08     Medications:    sodium chloride Stopped (05/13/20 0308)   sodium chloride Stopped (05/20/20 1216)   dexmedetomidine (PRECEDEX) IV infusion     feeding supplement (VITAL AF 1.2 CAL) 45 mL/hr at 05/21/20 0000   linezolid (ZYVOX) IV 600 mg (05/21/20 1224)    chlorhexidine gluconate (MEDLINE KIT)  15 mL Mouth Rinse BID   Chlorhexidine Gluconate Cloth  6 each Topical Q0600   feeding supplement (PROSource TF)  90 mL Per Tube TID   free water  200 mL Per Tube Q6H   insulin aspart  0-15 Units Subcutaneous Q4H   insulin aspart  16 Units  Subcutaneous Q4H   insulin detemir  44 Units Subcutaneous Q12H   mouth rinse  15 mL Mouth Rinse 10 times per day   multivitamin with minerals  1 tablet Per Tube Daily   pantoprazole  40 mg Intravenous Q12H   thiamine injection  100 mg Intravenous Daily   sodium chloride, acetaminophen (TYLENOL) oral liquid 160 mg/5 mL, acetaminophen, dextrose, docusate, fentaNYL (SUBLIMAZE) injection, metoprolol tartrate, polyethylene glycol  Assessment/ Plan:  75 y.o. male with past medical history of coronary artery disease, diabetes mellitus type 2, history of AICD placement who was admitted to [facility] on 05/26/2020 for evaluation of altered mental status.  He was admitted on 05/07/2020 with DKA and altered mental status.  1.  Acute kidney injury, baseline creatinine currently unknown. Negative ANCA/GBM 2.  Hypernatremia. 3.  Acute respiratory failure. 4.  MSSA bacteremia.  Plan: As before patient's baseline creatinine unknown. Creatinine currently 4.15 with an EGFR of 14. However patient with reasonable urine output at 1.4 L over the preceding 24 hours. Still hypernatremic. Increase free water flush to 200 cc every 4 hours. Otherwise weaning from the ventilator as per critical care. Overall prognosis remains guarded.   LOS: 9 Khylah Kendra 1/21/20221:03 PM

## 2020-05-21 NOTE — Progress Notes (Signed)
NAME:  Billy Barton, MRN:  [id number], DOB:  17-Feb-1946, LOS: 9 ADMISSION DATE:  05/10/2020, INITIAL CONSULTATION DATE:  05/03/2020 REFERRING MD:  Harvest Dark, CHIEF COMPLAINT:  DKA, altered mental status   Brief History:  75yo male w/unknown past medical history who presented to the ED w/AMS and was found to be in DKA vs HSS with a blood sugar >600 and lactate 5.8  Patient background:  75yo male w/unknown past medical history who presented to the ED via EMS after the son found him unresponsive, face down in the kitchen, incontinent of urine. Per the ED, the patient son reported to them they call and check on him daily and last spoke with him yesterday. When EMS arrived, the patient was awake but became unresponsive en route to the ED.   On arrival to the ED, the patient was arousable only to painful stimuli, hypothermic 94 otherwise stable vital signs. Labs revealed a blood glucose > 600, lactate 5.8, WBC 26.2, Hgb 7.1 and VBG: 7.19/36 and bicarb 13. He was given 1L LR bolus, started on endotool and antibiotics were initiated. Rapid COVID test negative  ICU was called for admission.   05/14/20 - patient with MSSA bacteremia, critially ill on mechanical ventilator.  Remains on vasopressor. Insulin gtt is off today.  Active GI bleed s/p PRBC transfusion. 05/15/20- patient remains hypotensive on MV.  Possible EGD when more stable - spoke with GI- Dr Viona Gilmore today. Still with edema 2+ on vasopressors.  05/16/20- patient on MV.  A line not functioning will dc today.  Pt in sinus rhythm remains intubated septic awaiting tee for endocarditis workup.  05/13/2020 - TEE performed at bedside.  05/18/20 - not waking up off sedation, nephrology consulted 05/22/2020 - remains unresponsive off all sedation  Past Medical History:  Unknown  Significant Hospital Events:  1/13-Admitted to ICU  Consults:  GI Cardiology ID Nephrology  Procedures:  ETT 1/13>> A line 1/13 - 1/16 CVL 1/13>>  Significant  Diagnostic Tests:  CT head 05/13/20 Stable non contrast CT appearance of the brain. No CT evidence of anoxic injury. 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  TEE 05/24/2020 EF 83-09% RV systolic function normal No evidence of vegetation/infective endocarditis  CT head 05/20/2020 No acute abnormality Extensive chronic microvascular ischemic change  Micro Data:  Bld Cx 05/06/2020 - MSSA Bld Cx 05/14/20 - Negative  Tracheal Aspirate 05/14/20 - MRSA  Antimicrobials:  Vancomycin 1/12 - off Cefepime 1/12 - off Flagyl 1/12 - off Zosyn 1/13 - off  Cefazolin 1/13>> off  Linezolid 17 Jan  Interim History / Subjective:  No acute events overnight. No changes in mental status.  Objective   Blood pressure 128/77, pulse 94, temperature 99.5 F (37.5 C), resp. rate (!) 22, height _0  (1.727 m), weight 108.5 kg, SpO2 96 %.    Vent Mode: PSV FiO2 (%):  [30 %] 30 % Set Rate:  [16 bmp] 16 bmp Vt Set:  [500 mL] 500 mL PEEP:  [5 cmH20] 5 cmH20 Pressure Support:  [10 cmH20] 10 cmH20   Intake/Output Summary (Last 24 hours) at 05/21/2020 1337 Last data filed at 05/21/2020 1300 Gross per 24 hour  Intake 529.74 ml  Output 1935 ml  Net -1405.26 ml   Filed Weights   05/19/20 0349 05/20/20 0407 05/21/20 0459  Weight: 108.4 kg 107.8 kg 108.5 kg    Examination: General: intubated, no acute distress HENT: [place]/AT, PERRL, eyes deviated up, sclera anicteric Lungs: clear to auscultation throughout. No  wheezing or rhonchi. Cardiovascular: RRR, s1s2, no murmur Abdomen: soft, non-distended, BS+ Extremities: Anasarca 2+, warm Neuro: No withdrawal to pain, no purposeful movement  GU: Foley catheter in place  Scheduled Meds: . chlorhexidine gluconate (MEDLINE KIT)  15 mL Mouth Rinse BID  . Chlorhexidine Gluconate Cloth  6 each Topical Q0600  . feeding supplement (PROSource TF)  90 mL Per Tube TID  . free water  200 mL Per Tube Q4H  . insulin aspart  0-15 Units Subcutaneous Q4H   . insulin aspart  16 Units Subcutaneous Q4H  . insulin detemir  44 Units Subcutaneous Q12H  . mouth rinse  15 mL Mouth Rinse 10 times per day  . multivitamin with minerals  1 tablet Per Tube Daily  . pantoprazole  40 mg Intravenous Q12H  . thiamine injection  100 mg Intravenous Daily   Continuous Infusions: . sodium chloride Stopped (05/13/20 0308)  . sodium chloride Stopped (05/20/20 1216)  . [START ON 05/24/2020]  ceFAZolin (ANCEF) IV    . dexmedetomidine (PRECEDEX) IV infusion    . feeding supplement (VITAL AF 1.2 CAL) 45 mL/hr at 05/21/20 0000  . linezolid (ZYVOX) IV 600 mg (05/21/20 1224)   PRN Meds:.sodium chloride, acetaminophen (TYLENOL) oral liquid 160 mg/5 mL, acetaminophen, dextrose, docusate, fentaNYL (SUBLIMAZE) injection, metoprolol tartrate, polyethylene glycol    Resolved Hospital Problem list   DKA Shock  Assessment & Plan:   Acute Hypoxemic Respiratory Failure MRSA Pneumonia In setting of septic shock due to MSSA bacteremia and Cardiac Arrest - Barrier to extubation is mental status at this time  - Tracheal aspirate from 05/14/20 has grown MRSA, treating with linezolid  - VAP bundle ordered - Continue daily PSV trial - Persistent encephalopathy is limitation to extubation - Palliative care consult  Acute Kidney Injury, non-oliguric Hypernatremia Uremia In setting of septic shock - Unknown baseline creatinine and BUN - Resume water flushes - Monitor electrolyte - Albumin supplementation - Nephrology following, appreciate input  Encephalopathy In setting of shock, sepsis and possible uremia. Possibly medication related. - Continue to hold sedation - add precedex if becomes agitated - Spot EEG 1/19 negative for seizure activity - CT head 1/20 with no acute findings - Not able to obtain MRI due to ICD - Check Urine drug screen for lingering sedatives (pending)  MSSA Bacteremia MRSA pneumonia - ID following, treating with cefazolin restarting  1/24 - Linezolid due to MRSA pneumonia - TEE negative for vegetations  Acute Blood Loss Anemia In setting of acute GI bleeding secondary to sepsis - Last transfusion 05/14/20 - GI not planning to scope, has signed off - BID PPI dosing   Diabetes Mellitus  - SSI q4hrs - Levemir 44 units BID - TF coverage increased to 16 units q4hrs  Best practice (evaluated daily)  Diet: TF Pain/Anxiety/Delirium protocol (if indicated): Fentanyl, precedex VAP protocol (if indicated): ordered DVT prophylaxis: scds GI prophylaxis: Pantoprazole BID Glucose control: SSI, levemir Mobility: bed rest Disposition:ICU Code Status: Full Family update: 1/20 by Dr. [name], if patient requires transfer for imaging, he asked if he could be transferred to the [place] in [facility].  Labs   CBC: Recent Labs  Lab 05/14/20 2104 05/15/20 0558 05/18/20 0407 05/18/20 1835 05/19/20 0355 05/19/20 1120 05/19/20 1724 05/19/20 2348 05/20/20 0631 05/21/20 0456  WBC 13.6*   < > 9.7 12.0* 11.5*  --   --   --  13.4* 12.3*  NEUTROABS 10.8*  --   --   --  7.8*  --   --   --  9.2* 8.4*  HGB 9.3*   < > 8.1* 9.3* 9.0* 8.6* 9.7* 9.3* 9.7* 9.2*  HCT 26.9*   < > 25.6* 29.2* 29.2* 28.0* 31.3* 29.2* 30.4* 29.3*  MCV 88.8   < > 93.8 93.9 94.8  --   --   --  93.8 94.2  PLT 108*   < > 171 193 208  --   --   --  259 286   < > = values in this interval not displayed.    Basic Metabolic Panel: Recent Labs  Lab 05/05/2020 0326 05/20/2020 1652 05/18/20 0407 05/18/20 1835 05/19/20 0355 05/19/20 1500 05/20/20 0631 05/21/20 0456  NA 156*  --  149*  --  148* 150* 150* 151*  K 3.6  --  3.6  --  3.2* 3.5 3.1* 3.6  CL 120*  --  116*  --  116* 117* 116* 114*  CO2 25  --  23  --  _0 21*  GLUCOSE 178*  --  367*  --  383* 325* 242* 210*  BUN 96*  --  96*  --  95* 93* 92* 98*  CREATININE 5.04*  --  4.93*  --  4.69* 4.58* 4.47* 4.15*  CALCIUM 7.7*  --  7.5*  --  7.6* 7.6* 7.6* 8.1*  MG 1.9  --  2.1  --  2.1  --  2.0 2.5*  PHOS 3.3    < > 3.7 3.9 4.2  --  4.4 5.2*   < > = values in this interval not displayed.   GFR: Estimated Creatinine Clearance: 18.6 mL/min (A) (by C-G formula based on SCr of 4.15 mg/dL (H)). Recent Labs  Lab 05/18/20 1835 05/19/20 0355 05/20/20 0631 05/21/20 0456  WBC 12.0* 11.5* 13.4* 12.3*    Liver Function Tests: Recent Labs  Lab 05/15/20 0558 05/16/20 0551 05/23/2020 0326 05/18/20 0407  AST _1 12*  ALT 6 <5 <5 <5  ALKPHOS 48 60 58 65  BILITOT 0.5 0.5 0.6 0.5  PROT 4.8* 5.2* 5.1* 5.1*  ALBUMIN 1.9* 1.8* 1.7* 1.6*   No results for input(s): LIPASE, AMYLASE in the last 168 hours. Recent Labs  Lab 05/21/20 1207  AMMONIA 17    ABG    Component Value Date/Time   PHART 7.40 05/14/2020 0333   PCO2ART 32 05/14/2020 0333   PO2ART 87 05/14/2020 0333   HCO3 19.8 (L) 05/14/2020 0333   ACIDBASEDEF 4.6 (H) 05/14/2020 0333   O2SAT 96.6 05/14/2020 0333     Coagulation Profile: No results for input(s): INR, PROTIME in the last 168 hours.  Cardiac Enzymes: No results for input(s): CKTOTAL, CKMB, CKMBINDEX, TROPONINI in the last 168 hours.  HbA1C: Hgb A1c MFr Bld  Date/Time Value Ref Range Status  05/13/2020 02:49 PM 10.2 (H) 4.8 - 5.6 % Final    Comment:    (NOTE) Pre diabetes:          5.7%-6.4%  Diabetes:              >6.4%  Glycemic control for   <7.0% adults with diabetes     CBG: Recent Labs  Lab 05/20/20 1952 05/20/20 2354 05/21/20 0405 05/21/20 0745 05/21/20 1110  GLUCAP 175* 228* 169* 174* 213*     Critical care time: 40 minutes     C. Derrill Kay, MD [facility]   *This note was dictated using voice recognition software/Dragon.  Despite best efforts to proofread, errors can occur which can change the meaning.  Any  change was purely unintentional.

## 2020-05-22 ENCOUNTER — Inpatient Hospital Stay: Payer: No Typology Code available for payment source

## 2020-05-22 LAB — CBC WITH DIFFERENTIAL/PLATELET
Abs Immature Granulocytes: 0.29 10*3/uL — ABNORMAL HIGH (ref 0.00–0.07)
Basophils Absolute: 0 10*3/uL (ref 0.0–0.1)
Basophils Relative: 0 %
Eosinophils Absolute: 0.9 10*3/uL — ABNORMAL HIGH (ref 0.0–0.5)
Eosinophils Relative: 8 %
HCT: 27.3 % — ABNORMAL LOW (ref 39.0–52.0)
Hemoglobin: 8.4 g/dL — ABNORMAL LOW (ref 13.0–17.0)
Immature Granulocytes: 3 %
Lymphocytes Relative: 12 %
Lymphs Abs: 1.4 10*3/uL (ref 0.7–4.0)
MCH: 29.6 pg (ref 26.0–34.0)
MCHC: 30.8 g/dL (ref 30.0–36.0)
MCV: 96.1 fL (ref 80.0–100.0)
Monocytes Absolute: 0.8 10*3/uL (ref 0.1–1.0)
Monocytes Relative: 7 %
Neutro Abs: 8.2 10*3/uL — ABNORMAL HIGH (ref 1.7–7.7)
Neutrophils Relative %: 70 %
Platelets: 270 10*3/uL (ref 150–400)
RBC: 2.84 MIL/uL — ABNORMAL LOW (ref 4.22–5.81)
RDW: 18.6 % — ABNORMAL HIGH (ref 11.5–15.5)
WBC: 11.7 10*3/uL — ABNORMAL HIGH (ref 4.0–10.5)
nRBC: 0 % (ref 0.0–0.2)

## 2020-05-22 LAB — BASIC METABOLIC PANEL
Anion gap: 13 (ref 5–15)
BUN: 94 mg/dL — ABNORMAL HIGH (ref 8–23)
CO2: 22 mmol/L (ref 22–32)
Calcium: 7.6 mg/dL — ABNORMAL LOW (ref 8.9–10.3)
Chloride: 115 mmol/L — ABNORMAL HIGH (ref 98–111)
Creatinine, Ser: 3.67 mg/dL — ABNORMAL HIGH (ref 0.61–1.24)
GFR, Estimated: 17 mL/min — ABNORMAL LOW (ref >60)
Glucose, Bld: 103 mg/dL — ABNORMAL HIGH (ref 70–99)
Potassium: 3.4 mmol/L — ABNORMAL LOW (ref 3.5–5.1)
Sodium: 150 mmol/L — ABNORMAL HIGH (ref 135–145)

## 2020-05-22 LAB — GLUCOSE, CAPILLARY
Glucose-Capillary: 115 mg/dL — ABNORMAL HIGH (ref 70–99)
Glucose-Capillary: 137 mg/dL — ABNORMAL HIGH (ref 70–99)
Glucose-Capillary: 140 mg/dL — ABNORMAL HIGH (ref 70–99)
Glucose-Capillary: 154 mg/dL — ABNORMAL HIGH (ref 70–99)
Glucose-Capillary: 164 mg/dL — ABNORMAL HIGH (ref 70–99)
Glucose-Capillary: 51 mg/dL — ABNORMAL LOW (ref 70–99)
Glucose-Capillary: 56 mg/dL — ABNORMAL LOW (ref 70–99)
Glucose-Capillary: 88 mg/dL (ref 70–99)
Glucose-Capillary: 96 mg/dL (ref 70–99)

## 2020-05-22 LAB — PHOSPHORUS: Phosphorus: 5.4 mg/dL — ABNORMAL HIGH (ref 2.5–4.6)

## 2020-05-22 LAB — MAGNESIUM: Magnesium: 2.3 mg/dL (ref 1.7–2.4)

## 2020-05-22 MED ORDER — FUROSEMIDE 10 MG/ML IJ SOLN
40.0000 mg | Freq: Every day | INTRAMUSCULAR | Status: DC
  Administered 2020-05-22 – 2020-05-26 (×5): 40 mg via INTRAVENOUS
  Filled 2020-05-22 (×5): qty 4

## 2020-05-22 MED ORDER — POTASSIUM CHLORIDE 20 MEQ PO PACK
40.0000 meq | PACK | Freq: Once | ORAL | Status: AC
  Administered 2020-05-22: 40 meq
  Filled 2020-05-22: qty 2

## 2020-05-22 MED ORDER — GLYCOPYRROLATE 0.2 MG/ML IJ SOLN
0.1000 mg | Freq: Two times a day (BID) | INTRAMUSCULAR | Status: AC
  Administered 2020-05-22 – 2020-05-23 (×4): 0.1 mg via INTRAVENOUS
  Filled 2020-05-22 (×3): qty 1

## 2020-05-22 NOTE — Progress Notes (Addendum)
0300: Pt had episode of severe gagging and coughing, appeared to vomit small amount of tube feeds. Tube feeding stopped, Britton-Lee R-C., NP notified. CXR ordered. Precedex gtt started.   04:05: Telemetry monitor showed patient had converted from SR 80s to a wide complex V-paced rhythm in the 80s with what appeared to be ST elevation. Precedex gtt stopped. EKG obtained. Britton-Lee R-C., NP notified. No new orders at this time, waiting to review morning labs.   0530: Patient returned to SR with 1st degree AVB.

## 2020-05-22 NOTE — Progress Notes (Signed)
PHARMACY CONSULT NOTE  Pharmacy Consult for Electrolyte Monitoring and Replacement   Recent Labs: Potassium (mmol/L)  Date Value  05/22/2020 3.4 (L)   Magnesium (mg/dL)  Date Value  28/00/3491 2.3   Calcium (mg/dL)  Date Value  79/15/0569 7.6 (L)   Albumin (g/dL)  Date Value  79/48/0165 1.6 (L)   Phosphorus (mg/dL)  Date Value  53/74/8270 5.4 (H)   Sodium (mmol/L)  Date Value  05/22/2020 150 (H)   Corrected Ca: 9.5 mg/dL  Assessment: 75 year old male admitted with metabolic acidosis s/t DKA vs HHS. Code Blue called after arrival to ICU and patient intubated. Blood cultures positive for MSSA.  Pharmacy to manage electrolytes.  Nutrition: ProSource 90 mL TID + Vital 45 mL/hr  Goal of Therapy:  Electrolytes WNL  Plan:   Hypernatremia: Continue free water flushes 200 mL q4h   40 mEq KCl per tube x 1 per provider  Re-check electrolytes in am  Lowella Bandy ,PharmD Clinical Pharmacist 05/22/2020 8:01 AM

## 2020-05-22 NOTE — Progress Notes (Signed)
Opens eyes spontaneously, but does not focus on any object, track or follow commands. TF currently on hold, pt noted gagging, with increased secretions into ETT tube. Suctioned with good results. No residual noted via OG, Dr. Karna Christmas notified. Pale yellow urine draining to foley bag. Black loose stool draining to rectal tube bag.

## 2020-05-22 NOTE — Progress Notes (Signed)
[facility]  ROUNDING NOTE   Subjective:   01/21 0701 - 01/22 0700 In: 984.9 [I.V.:14.6; IV Piggyback:970.3] Out: 2750 [Urine:2650; Stool:100]  Creatinine 3.67 (4.15) Na 150 IV furosemide $RemoveBefor'40mg'IxIfHgIFnAcv$  IV x 2 yesterday  Objective:  Vital signs in last 24 hours:  Temp:  [98.96 F (37.2 C)-99.5 F (37.5 C)] 99.14 F (37.3 C) (01/22 0700) Pulse Rate:  [84-97] 86 (01/22 0700) Resp:  [15-24] 20 (01/22 0700) BP: (116-153)/(64-117) 152/78 (01/22 0700) SpO2:  [89 %-98 %] 95 % (01/22 1130) FiO2 (%):  [30 %] 30 % (01/22 1200) Weight:  [107.8 kg] 107.8 kg (01/22 0500)  Weight change: -0.7 kg Filed Weights   05/20/20 0407 05/21/20 0459 05/22/20 0500  Weight: 107.8 kg 108.5 kg 107.8 kg    Intake/Output: I/O last 3 completed shifts: In: 1287.9 [I.V.:14.6; NG/GT:303; IV Piggyback:970.3] Out: 2750 [Urine:2650; Stool:100]   Intake/Output this shift:  Total I/O In: -  Out: 550 [Urine:550]  Physical Exam: General:  Critically ill  Head:  ETT  Eyes:  Anicteric  Neck:  trachea midline  Lungs:   PRVC FiO2 30%  Heart:  regular  Abdomen:   Soft, obese, bowel sounds present  Extremities:  1+ peripheral edema.  Neurologic:  Intubated, sedated  Skin:  No rash  Access:  No dialysis access    Basic Metabolic Panel: Recent Labs  Lab 05/18/20 0407 05/18/20 1835 05/19/20 0355 05/19/20 1500 05/20/20 0631 05/21/20 0456 05/22/20 0438  NA 149*  --  148* 150* 150* 151* 150*  K 3.6  --  3.2* 3.5 3.1* 3.6 3.4*  CL 116*  --  116* 117* 116* 114* 115*  CO2 23  --  $R'22 22 24 'OO$ 21* 22  GLUCOSE 367*  --  383* 325* 242* 210* 103*  BUN 96*  --  95* 93* 92* 98* 94*  CREATININE 4.93*  --  4.69* 4.58* 4.47* 4.15* 3.67*  CALCIUM 7.5*  --  7.6* 7.6* 7.6* 8.1* 7.6*  MG 2.1  --  2.1  --  2.0 2.5* 2.3  PHOS 3.7 3.9 4.2  --  4.4 5.2* 5.4*    Liver Function Tests: Recent Labs  Lab 05/16/20 0551 05/30/2020 0326 05/18/20 0407  AST 16 15 12*  ALT <5 <5 <5  ALKPHOS 60 58 65  BILITOT 0.5 0.6  0.5  PROT 5.2* 5.1* 5.1*  ALBUMIN 1.8* 1.7* 1.6*   No results for input(s): LIPASE, AMYLASE in the last 168 hours. Recent Labs  Lab 05/21/20 1207  AMMONIA 17    CBC: Recent Labs  Lab 05/18/20 1835 05/19/20 0355 05/19/20 1120 05/19/20 1724 05/19/20 2348 05/20/20 0631 05/21/20 0456 05/22/20 0438  WBC 12.0* 11.5*  --   --   --  13.4* 12.3* 11.7*  NEUTROABS  --  7.8*  --   --   --  9.2* 8.4* 8.2*  HGB 9.3* 9.0*   < > 9.7* 9.3* 9.7* 9.2* 8.4*  HCT 29.2* 29.2*   < > 31.3* 29.2* 30.4* 29.3* 27.3*  MCV 93.9 94.8  --   --   --  93.8 94.2 96.1  PLT 193 208  --   --   --  259 286 270   < > = values in this interval not displayed.    Cardiac Enzymes: No results for input(s): CKTOTAL, CKMB, CKMBINDEX, TROPONINI in the last 168 hours.  BNP: Invalid input(s): POCBNP  CBG: Recent Labs  Lab 05/22/20 0426 05/22/20 0735 05/22/20 0818 05/22/20 0838 05/22/20 1150  GLUCAP 96 51*  13* 36* 154*    Microbiology: Results for orders placed or performed during the hospital encounter of 05/29/2020  Blood culture (routine single)     Status: Abnormal   Collection Time: 05/24/2020 10:24 PM   Specimen: BLOOD  Result Value Ref Range Status   Specimen Description   Final    BLOOD LEFT ASSIST CONTROL Performed at [facility], [address] [place], [place] [address]    Special Requests   Final    BOTTLES DRAWN AEROBIC AND ANAEROBIC Blood Culture adequate volume Performed at [facility], [address] [place], [place] [address]    Culture  Setup Time   Final    Organism ID to follow GRAM POSITIVE COCCI IN BOTH AEROBIC AND ANAEROBIC BOTTLES CRITICAL RESULT CALLED TO, READ BACK BY AND VERIFIED WITH: MYRA SLAUGHTER AT 74 ON 05/13/20 SNG GRAM NEGATIVE RODS ANAEROBIC BOTTLE ONLY CRITICAL RESULT CALLED TO, READ BACK BY AND VERIFIED WITH: MORGAN HICKS PHARMD $RemoveBefo'[id number]$  05/14/20 EB Performed at [facility], [address] [place],  [address]    Culture  STAPHYLOCOCCUS AUREUS PROTEUS MIRABILIS  (A)  Final   Report Status 05/16/2020 FINAL  Final   Organism ID, Bacteria STAPHYLOCOCCUS AUREUS  Final   Organism ID, Bacteria PROTEUS MIRABILIS  Final      Susceptibility   Proteus mirabilis - MIC*    AMPICILLIN <=2 SENSITIVE Sensitive     CEFAZOLIN <=4 SENSITIVE Sensitive     CEFEPIME <=0.12 SENSITIVE Sensitive     CEFTAZIDIME <=1 SENSITIVE Sensitive     CEFTRIAXONE <=0.25 SENSITIVE Sensitive     CIPROFLOXACIN <=0.25 SENSITIVE Sensitive     GENTAMICIN <=1 SENSITIVE Sensitive     IMIPENEM 8 INTERMEDIATE Intermediate     TRIMETH/SULFA <=20 SENSITIVE Sensitive     AMPICILLIN/SULBACTAM <=2 SENSITIVE Sensitive     PIP/TAZO <=4 SENSITIVE Sensitive     * PROTEUS MIRABILIS   Staphylococcus aureus - MIC*    CIPROFLOXACIN <=0.5 SENSITIVE Sensitive     ERYTHROMYCIN <=0.25 SENSITIVE Sensitive     GENTAMICIN <=0.5 SENSITIVE Sensitive     OXACILLIN 0.5 SENSITIVE Sensitive     TETRACYCLINE <=1 SENSITIVE Sensitive     VANCOMYCIN 1 SENSITIVE Sensitive     TRIMETH/SULFA <=10 SENSITIVE Sensitive     CLINDAMYCIN <=0.25 SENSITIVE Sensitive     RIFAMPIN <=0.5 SENSITIVE Sensitive     Inducible Clindamycin NEGATIVE Sensitive     * STAPHYLOCOCCUS AUREUS  Culture, blood (single)     Status: None   Collection Time: 05/09/2020 10:24 PM   Specimen: BLOOD  Result Value Ref Range Status   Specimen Description BLOOD LEFT HAND  Final   Special Requests   Final    BOTTLES DRAWN AEROBIC AND ANAEROBIC Blood Culture adequate volume   Culture   Final    NO GROWTH 5 DAYS Performed at [facility], [address] [place],  [address]    Report Status 05/01/2020 FINAL  Final  Blood Culture ID Panel (Reflexed)     Status: Abnormal   Collection Time: 05/19/2020 10:24 PM  Result Value Ref Range Status   Enterococcus faecalis NOT DETECTED NOT DETECTED Final   Enterococcus Faecium NOT DETECTED NOT DETECTED Final   Listeria monocytogenes NOT DETECTED NOT  DETECTED Final   Staphylococcus species DETECTED (A) NOT DETECTED Final    Comment: CRITICAL RESULT CALLED TO, READ BACK BY AND VERIFIED WITH: MYRA SLAUGHTER AT 1309 ON 05/13/20 SNG    Staphylococcus aureus (BCID) DETECTED (A)  NOT DETECTED Final    Comment: CRITICAL RESULT CALLED TO, READ BACK BY AND VERIFIED WITH: MYRA SLAUGHTER AT 2355 05/13/20 SNG    Staphylococcus epidermidis NOT DETECTED NOT DETECTED Final   Staphylococcus lugdunensis NOT DETECTED NOT DETECTED Final   Streptococcus species NOT DETECTED NOT DETECTED Final   Streptococcus agalactiae NOT DETECTED NOT DETECTED Final   Streptococcus pneumoniae NOT DETECTED NOT DETECTED Final   Streptococcus pyogenes NOT DETECTED NOT DETECTED Final   A.calcoaceticus-baumannii NOT DETECTED NOT DETECTED Final   Bacteroides fragilis NOT DETECTED NOT DETECTED Final   Enterobacterales NOT DETECTED NOT DETECTED Final   Enterobacter cloacae complex NOT DETECTED NOT DETECTED Final   Escherichia coli NOT DETECTED NOT DETECTED Final   Klebsiella aerogenes NOT DETECTED NOT DETECTED Final   Klebsiella oxytoca NOT DETECTED NOT DETECTED Final   Klebsiella pneumoniae NOT DETECTED NOT DETECTED Final   Proteus species NOT DETECTED NOT DETECTED Final   Salmonella species NOT DETECTED NOT DETECTED Final   Serratia marcescens NOT DETECTED NOT DETECTED Final   Haemophilus influenzae NOT DETECTED NOT DETECTED Final   Neisseria meningitidis NOT DETECTED NOT DETECTED Final   Pseudomonas aeruginosa NOT DETECTED NOT DETECTED Final   Stenotrophomonas maltophilia NOT DETECTED NOT DETECTED Final   Candida albicans NOT DETECTED NOT DETECTED Final   Candida auris NOT DETECTED NOT DETECTED Final   Candida glabrata NOT DETECTED NOT DETECTED Final   Candida krusei NOT DETECTED NOT DETECTED Final   Candida parapsilosis NOT DETECTED NOT DETECTED Final   Candida tropicalis NOT DETECTED NOT DETECTED Final   Cryptococcus neoformans/gattii NOT DETECTED NOT DETECTED Final    Meth resistant mecA/C and MREJ NOT DETECTED NOT DETECTED Final    Comment: Performed at [facility], [address], [place], [place] [address]  Urine culture     Status: None   Collection Time: 05/13/20 12:26 AM   Specimen: In/Out Cath Urine  Result Value Ref Range Status   Specimen Description   Final    IN/OUT CATH URINE Performed at [facility], [address] [place], [place] [address]    Special Requests   Final    NONE Performed at [facility], [address] [place], [place] [address]    Culture   Final    NO GROWTH Performed at [facility], [address] [place], [place] [address]    Report Status 05/14/2020 FINAL  Final  Resp Panel by RT-PCR (Flu A&B, Covid) Nasopharyngeal Swab     Status: None   Collection Time: 05/13/20 12:26 AM   Specimen: Nasopharyngeal Swab; Nasopharyngeal(NP) swabs in vial transport medium  Result Value Ref Range Status   SARS Coronavirus 2 by RT PCR NEGATIVE NEGATIVE Final    Comment: (NOTE) SARS-CoV-2 target nucleic acids are NOT DETECTED.  The SARS-CoV-2 RNA is generally detectable in upper respiratory specimens during the acute phase of infection. The lowest concentration of SARS-CoV-2 viral copies this assay can detect is 138 copies/mL. A negative result does not preclude SARS-Cov-2 infection and should not be used as the sole basis for treatment or other patient management decisions. A negative result may occur with  improper specimen collection/handling, submission of specimen other than nasopharyngeal swab, presence of viral mutation(s) within the areas targeted by this assay, and inadequate number of viral copies(<138 copies/mL). A negative result must be combined with clinical observations, patient history, and epidemiological information. The expected result is Negative.  Fact Sheet for Patients:  EntrepreneurPulse.com.au  Fact Sheet for Healthcare Providers:  IncredibleEmployment.be  This test is no t yet approved or cleared by the [place] and  has been authorized for detection and/or diagnosis of SARS-CoV-2 by FDA under an Emergency Use Authorization (EUA). This EUA will remain  in effect (meaning this test can be used) for the duration of the COVID-19 declaration under Section 564(b)(1) of the Act, 21 U.S.C.section 360bbb-3(b)(1), unless the authorization is terminated  or revoked sooner.       Influenza A by PCR NEGATIVE NEGATIVE Final   Influenza B by PCR NEGATIVE NEGATIVE Final    Comment: (NOTE) The Xpert Xpress SARS-CoV-2/FLU/RSV plus assay is intended as an aid in the diagnosis of influenza from Nasopharyngeal swab specimens and should not be used as a sole basis for treatment. Nasal washings and aspirates are unacceptable for Xpert Xpress SARS-CoV-2/FLU/RSV testing.  Fact Sheet for Patients: EntrepreneurPulse.com.au  Fact Sheet for Healthcare Providers: IncredibleEmployment.be  This test is not yet approved or cleared by the [place] FDA and has been authorized for detection and/or diagnosis of SARS-CoV-2 by FDA under an Emergency Use Authorization (EUA). This EUA will remain in effect (meaning this test can be used) for the duration of the COVID-19 declaration under Section 564(b)(1) of the Act, 21 U.S.C. section 360bbb-3(b)(1), unless the authorization is terminated or revoked.  Performed at [facility], [address] [place], [place] [address]   Culture, blood (Routine X 2) w Reflex to ID Panel     Status: None   Collection Time: 05/14/20  2:41 PM   Specimen: BLOOD  Result Value Ref Range Status   Specimen Description BLOOD RIGHT ANTECUBITAL  Final   Special Requests   Final    BOTTLES DRAWN AEROBIC AND ANAEROBIC Blood Culture adequate volume   Culture   Final    NO GROWTH 5 DAYS Performed at [facility], [address] [place], [place] [address]    Report Status 05/19/2020 FINAL  Final  Culture, blood (Routine X 2) w Reflex to ID Panel     Status: None   Collection Time: 05/14/20  2:55 PM   Specimen: BLOOD  Result Value Ref Range Status   Specimen Description BLOOD BLOOD RIGHT HAND  Final   Special Requests   Final    BOTTLES DRAWN AEROBIC AND ANAEROBIC Blood Culture results may not be optimal due to an excessive volume of blood received in culture bottles   Culture   Final    NO GROWTH 5 DAYS Performed at [facility], [address] [place], [place] [address]    Report Status 05/19/2020 FINAL  Final  Culture, respiratory (non-expectorated)     Status: None   Collection Time: 05/14/20  5:34 PM   Specimen: Tracheal Aspirate; Respiratory  Result Value Ref Range Status   Specimen Description   Final    TRACHEAL ASPIRATE Performed at [facility], [address] [place], [place] [address]    Special Requests   Final    NONE Performed at [facility], [address] [place], [place] [address]    Gram Stain   Final    MODERATE WBC PRESENT, PREDOMINANTLY PMN FEW GRAM POSITIVE COCCI IN CLUSTERS Performed at [facility], [address], [place], [place] [address]    Culture RARE METHICILLIN RESISTANT STAPHYLOCOCCUS AUREUS  Final   Report Status 05/30/2020 FINAL  Final   Organism ID, Bacteria METHICILLIN RESISTANT STAPHYLOCOCCUS AUREUS  Final      Susceptibility   Methicillin resistant  staphylococcus aureus - MIC*    CIPROFLOXACIN >=8 RESISTANT Resistant     ERYTHROMYCIN >=8 RESISTANT Resistant     GENTAMICIN <=0.5 SENSITIVE Sensitive     OXACILLIN >=4 RESISTANT Resistant     TETRACYCLINE <=1 SENSITIVE Sensitive     VANCOMYCIN <=0.5 SENSITIVE Sensitive     TRIMETH/SULFA <=10 SENSITIVE Sensitive     CLINDAMYCIN >=8 RESISTANT Resistant     RIFAMPIN <=0.5 SENSITIVE Sensitive     Inducible Clindamycin NEGATIVE Sensitive     * RARE METHICILLIN RESISTANT  STAPHYLOCOCCUS AUREUS  MRSA PCR Screening     Status: Abnormal   Collection Time: 05/14/20  9:04 PM   Specimen: Nasopharyngeal  Result Value Ref Range Status   MRSA by PCR POSITIVE (A) NEGATIVE Final    Comment:        The GeneXpert MRSA Assay (FDA approved for NASAL specimens only), is one component of a comprehensive MRSA colonization surveillance program. It is not intended to diagnose MRSA infection nor to guide or monitor treatment for MRSA infections. RESULT CALLED TO, READ BACK BY AND VERIFIED WITH: MELISSA COBB AT [phone] 05/15/20.PMF Performed at [facility], [address] [place], [place] [address]     Coagulation Studies: No results for input(s): LABPROT, INR in the last 72 hours.  Urinalysis: No results for input(s): COLORURINE, LABSPEC, PHURINE, GLUCOSEU, HGBUR, BILIRUBINUR, KETONESUR, PROTEINUR, UROBILINOGEN, NITRITE, LEUKOCYTESUR in the last 72 hours.  Invalid input(s): APPERANCEUR    Imaging: DG Chest Port 1 View  Result Date: 05/22/2020 CLINICAL DATA:  Vomiting EXAM: PORTABLE CHEST 1 VIEW COMPARISON:  05/19/2020 FINDINGS: Endotracheal tube seen 4.6 cm above the carina. Nasogastric tube extends into the upper abdomen beyond the margin of the examination. Right internal jugular central venous catheter tip noted at the superior cavoatrial junction. Progressive retrocardiac opacification may relate to atelectasis or developing infiltrate within this region. The lungs are otherwise clear. No pneumothorax or pleural effusion. Cardiac size within normal limits. 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 The pulmonary vascularity is normal. IMPRESSION: Stable support lines and tubes. Progressive retrocardiac opacification, possibly representing developing atelectasis or infiltrate within this region. Electronically Signed   By: Helyn Numbers MD   On: 05/22/2020 06:01     Medications:   . sodium chloride Stopped (05/13/20 0308)  . sodium chloride  Stopped (05/20/20 2308)  . albumin human 25 g (05/22/20 1000)  . [START ON 05/24/2020]  ceFAZolin (ANCEF) IV    . dexmedetomidine (PRECEDEX) IV infusion Stopped (05/22/20 0428)  . feeding supplement (VITAL AF 1.2 CAL) 45 mL/hr at 05/21/20 0000  . linezolid (ZYVOX) IV 600 mg (05/22/20 0954)   . chlorhexidine gluconate (MEDLINE KIT)  15 mL Mouth Rinse BID  . Chlorhexidine Gluconate Cloth  6 each Topical Q0600  . feeding supplement (PROSource TF)  90 mL Per Tube TID  . free water  200 mL Per Tube Q4H  . furosemide  40 mg Intravenous Daily  . insulin aspart  0-15 Units Subcutaneous Q4H  . insulin aspart  16 Units Subcutaneous Q4H  . insulin detemir  44 Units Subcutaneous Q12H  . mouth rinse  15 mL Mouth Rinse 10 times per day  . multivitamin with minerals  1 tablet Per Tube Daily  . pantoprazole  40 mg Intravenous Q12H  . thiamine injection  100 mg Intravenous Daily   sodium chloride, acetaminophen (TYLENOL) oral liquid 160 mg/5 mL, acetaminophen, dextrose, docusate, fentaNYL (SUBLIMAZE) injection, metoprolol tartrate, polyethylene glycol  Assessment/ Plan:    Mr. Billy  Barton is a 75 y.o. white male with coronary artery disease, diabetes mellitus type 2, history of AICD placement who was admitted to [facility] on 05/03/2020 for evaluation of altered mental status and DKA  1.  Acute kidney injury, baseline creatinine currently unknown. Renal ultrasound consistent with chronic kidney disease.  2. Proteinuria and glycosuria consistent with diabetic nephropathy.  3.  Hypernatremia. Free water deficit of 3.9 liters 4.  Acute respiratory failure secondary to MRSA pneumonia  5. Sepsis with  MSSA  And proteus mirabilis bacteremia.  Plan: No indication for dialysis.  - discontinue furosemide - Continue free water flushes as long as NGT functioning.  - Will consider dextrose infusion if no improvement.    LOS: 10 Shavontae Gibeault 1/22/20221:03 PM

## 2020-05-22 NOTE — Progress Notes (Signed)
NAME:  Billy Barton, MRN:  [id number], DOB:  03-23-1946, LOS: 10 ADMISSION DATE:  05/04/2020, INITIAL CONSULTATION DATE:  05/24/2020 REFERRING MD:  Harvest Dark, CHIEF COMPLAINT:  DKA, altered mental status   Brief History:  75yo male w/unknown past medical history who presented to the ED w/AMS and was found to be in DKA vs HSS with a blood sugar >600 and lactate 5.8  Patient background:  75yo male w/unknown past medical history who presented to the ED via EMS after the son found him unresponsive, face down in the kitchen, incontinent of urine. Per the ED, the patient son reported to them they call and check on him daily and last spoke with him yesterday. When EMS arrived, the patient was awake but became unresponsive en route to the ED.   On arrival to the ED, the patient was arousable only to painful stimuli, hypothermic 94 otherwise stable vital signs. Labs revealed a blood glucose > 600, lactate 5.8, WBC 26.2, Hgb 7.1 and VBG: 7.19/36 and bicarb 13. He was given 1L LR bolus, started on endotool and antibiotics were initiated. Rapid COVID test negative  ICU was called for admission.   05/14/20 - patient with MSSA bacteremia, critially ill on mechanical ventilator.  Remains on vasopressor. Insulin gtt is off today.  Active GI bleed s/p PRBC transfusion. 05/15/20- patient remains hypotensive on MV.  Possible EGD when more stable - spoke with GI- Dr Viona Gilmore today. Still with edema 2+ on vasopressors.  05/16/20- patient on MV.  A line not functioning will dc today.  Pt in sinus rhythm remains intubated septic awaiting tee for endocarditis workup.  05/24/2020 - TEE performed at bedside.  05/18/20 - not waking up off sedation, nephrology consulted 05/22/2020 - remains unresponsive off all sedation, secretions from ETT are heavy, will repeat tracheal aspirate. FiO2 is down to 30%, patient intermittetly biting on ETT during examination.  He is rhonchorous bilaterally on auscultation.  Overnight patient had  non bloody vomitus and OGT feeds have been held today.  UOP overnight is adequate.   Past Medical History:  Unknown  Significant Hospital Events:  1/13-Admitted to ICU  Consults:  GI Cardiology ID Nephrology  Procedures:  ETT 1/13>> A line 1/13 - 1/16 CVL 1/13>>  Significant Diagnostic Tests:  CT head 05/13/20 Stable non contrast CT appearance of the brain. No CT evidence of anoxic injury. 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  TEE 05/14/2020 EF 17-49% RV systolic function normal No evidence of vegetation/infective endocarditis  CT head 05/20/2020 No acute abnormality Extensive chronic microvascular ischemic change  Micro Data:  Bld Cx 05/20/2020 - MSSA Bld Cx 05/14/20 - Negative  Tracheal Aspirate 05/14/20 - MRSA  Antimicrobials:  Vancomycin 1/12 - off Cefepime 1/12 - off Flagyl 1/12 - off Zosyn 1/13 - off  Cefazolin 1/13>> off  Linezolid 17 Jan  Interim History / Subjective:  No acute events overnight. No changes in mental status.  Objective   Blood pressure (!) 152/78, pulse 86, temperature 99.14 F (37.3 C), resp. rate 20, height $RemoveBe'5\' 8"'tZbwEuqPA$  (1.727 m), weight 107.8 kg, SpO2 98 %.    Vent Mode: PRVC FiO2 (%):  [30 %] 30 % Set Rate:  [16 bmp] 16 bmp Vt Set:  [500 mL] 500 mL PEEP:  [5 cmH20] 5 cmH20 Pressure Support:  [10 cmH20] 10 cmH20   Intake/Output Summary (Last 24 hours) at 05/22/2020 0749 Last data filed at 05/22/2020 0500 Gross per 24 hour  Intake 984.88 ml  Output 2650  ml  Net -1665.12 ml   Filed Weights   05/20/20 0407 05/21/20 0459 05/22/20 0500  Weight: 107.8 kg 108.5 kg 107.8 kg    Examination: General: intubated, no acute distress HENT: /AT, PERRL, eyes deviated up, sclera anicteric Lungs: bilateral  rhonchi. Cardiovascular: RRR, s1s2, no murmur Abdomen: soft, non-distended, BS+ Extremities: Anasarca 2+, warm Neuro: gcs 5T GU: Foley catheter in place  Scheduled Meds: . chlorhexidine gluconate (MEDLINE KIT)   15 mL Mouth Rinse BID  . Chlorhexidine Gluconate Cloth  6 each Topical Q0600  . feeding supplement (PROSource TF)  90 mL Per Tube TID  . free water  200 mL Per Tube Q4H  . insulin aspart  0-15 Units Subcutaneous Q4H  . insulin aspart  16 Units Subcutaneous Q4H  . insulin detemir  44 Units Subcutaneous Q12H  . mouth rinse  15 mL Mouth Rinse 10 times per day  . multivitamin with minerals  1 tablet Per Tube Daily  . pantoprazole  40 mg Intravenous Q12H  . potassium chloride  40 mEq Per Tube Once  . thiamine injection  100 mg Intravenous Daily   Continuous Infusions: . sodium chloride Stopped (05/13/20 0308)  . sodium chloride Stopped (05/20/20 2308)  . albumin human Stopped (05/22/20 0029)  . [START ON 05/24/2020]  ceFAZolin (ANCEF) IV    . dexmedetomidine (PRECEDEX) IV infusion Stopped (05/22/20 0428)  . feeding supplement (VITAL AF 1.2 CAL) 45 mL/hr at 05/21/20 0000  . linezolid (ZYVOX) IV Stopped (05/22/20 0019)   PRN Meds:.sodium chloride, acetaminophen (TYLENOL) oral liquid 160 mg/5 mL, acetaminophen, dextrose, docusate, fentaNYL (SUBLIMAZE) injection, metoprolol tartrate, polyethylene glycol    Resolved Hospital Problem list   DKA Shock  Assessment & Plan:   Acute Hypoxemic Respiratory Failure MRSA Pneumonia In setting of septic shock due to MSSA bacteremia and Cardiac Arrest - Barrier to extubation is mental status at this time  - Tracheal aspirate from 05/14/20 has grown MRSA, treating with linezolid             Repeat tracheal aspirate - 05/22/20 - VAP bundle ordered - Continue daily PSV trial - Persistent encephalopathy is limitation to extubation - Palliative care consult  Acute Kidney Injury, non-oliguric Hypernatremia Uremia In setting of septic shock - Unknown baseline creatinine and BUN - Resume water flushes - Monitor electrolyte - Albumin supplementation - Nephrology following, appreciate input  Encephalopathy In setting of shock, sepsis and possible  uremia. Possibly medication related. - Continue to hold sedation - add precedex if becomes agitated - Spot EEG 1/19 negative for seizure activity - CT head 1/20 with no acute findings - Not able to obtain MRI due to ICD - Check Urine drug screen for lingering sedatives (pending)  MSSA Bacteremia MRSA pneumonia - ID following, treating with cefazolin restarting 1/24 - Linezolid due to MRSA pneumonia - TEE negative for vegetations  Acute Blood Loss Anemia In setting of acute GI bleeding secondary to sepsis - Last transfusion 05/14/20 - GI not planning to scope, has signed off - BID PPI dosing   Diabetes Mellitus  - SSI q4hrs - Levemir 44 units BID - TF coverage increased to 16 units q4hrs  Best practice (evaluated daily)  Diet: TF Pain/Anxiety/Delirium protocol (if indicated): Fentanyl, precedex VAP protocol (if indicated): ordered DVT prophylaxis: scds GI prophylaxis: Pantoprazole BID Glucose control: SSI, levemir Mobility: bed rest Disposition:ICU Code Status: Full   Labs   CBC: Recent Labs  Lab 05/18/20 1835 05/19/20 0355 05/19/20 1120 05/19/20 1724 05/19/20 2348 05/20/20 0631  05/21/20 0456 05/22/20 0438  WBC 12.0* 11.5*  --   --   --  13.4* 12.3* 11.7*  NEUTROABS  --  7.8*  --   --   --  9.2* 8.4* 8.2*  HGB 9.3* 9.0*   < > 9.7* 9.3* 9.7* 9.2* 8.4*  HCT 29.2* 29.2*   < > 31.3* 29.2* 30.4* 29.3* 27.3*  MCV 93.9 94.8  --   --   --  93.8 94.2 96.1  PLT 193 208  --   --   --  259 286 270   < > = values in this interval not displayed.    Basic Metabolic Panel: Recent Labs  Lab 05/18/20 0407 05/18/20 1835 05/19/20 0355 05/19/20 1500 05/20/20 0631 05/21/20 0456 05/22/20 0438  NA 149*  --  148* 150* 150* 151* 150*  K 3.6  --  3.2* 3.5 3.1* 3.6 3.4*  CL 116*  --  116* 117* 116* 114* 115*  CO2 23  --  $R'22 22 24 'dX$ 21* 22  GLUCOSE 367*  --  383* 325* 242* 210* 103*  BUN 96*  --  95* 93* 92* 98* 94*  CREATININE 4.93*  --  4.69* 4.58* 4.47* 4.15* 3.67*   CALCIUM 7.5*  --  7.6* 7.6* 7.6* 8.1* 7.6*  MG 2.1  --  2.1  --  2.0 2.5* 2.3  PHOS 3.7 3.9 4.2  --  4.4 5.2* 5.4*   GFR: Estimated Creatinine Clearance: 21 mL/min (A) (by C-G formula based on SCr of 3.67 mg/dL (H)). Recent Labs  Lab 05/19/20 0355 05/20/20 0631 05/21/20 0456 05/22/20 0438  WBC 11.5* 13.4* 12.3* 11.7*    Liver Function Tests: Recent Labs  Lab 05/16/20 0551 05/10/2020 0326 05/18/20 0407  AST 16 15 12*  ALT <5 <5 <5  ALKPHOS 60 58 65  BILITOT 0.5 0.6 0.5  PROT 5.2* 5.1* 5.1*  ALBUMIN 1.8* 1.7* 1.6*   No results for input(s): LIPASE, AMYLASE in the last 168 hours. Recent Labs  Lab 05/21/20 1207  AMMONIA 17    ABG    Component Value Date/Time   PHART 7.40 05/14/2020 0333   PCO2ART 32 05/14/2020 0333   PO2ART 87 05/14/2020 0333   HCO3 19.8 (L) 05/14/2020 0333   ACIDBASEDEF 4.6 (H) 05/14/2020 0333   O2SAT 96.6 05/14/2020 0333     Coagulation Profile: No results for input(s): INR, PROTIME in the last 168 hours.  Cardiac Enzymes: No results for input(s): CKTOTAL, CKMB, CKMBINDEX, TROPONINI in the last 168 hours.  HbA1C: Hgb A1c MFr Bld  Date/Time Value Ref Range Status  05/13/2020 02:49 PM 10.2 (H) 4.8 - 5.6 % Final    Comment:    (NOTE) Pre diabetes:          5.7%-6.4%  Diabetes:              >6.4%  Glycemic control for   <7.0% adults with diabetes     CBG: Recent Labs  Lab 05/21/20 1550 05/21/20 2046 05/22/20 0025 05/22/20 0426 05/22/20 0735  GLUCAP 178* 129* 140* 96 51*     Critical care time: 33 minutes     Critical care provider statement:    Critical care time (minutes):  33   Critical care time was exclusive of:  Separately billable procedures and  treating other patients   Critical care was necessary to treat or prevent imminent or  life-threatening deterioration of the following conditions:  acute hypoxemic respiratory failure, mssa pneumonia, sepsis, multiple comorbid conditions  Critical care was time spent  personally by me on the following  activities:  Development of treatment plan with patient or surrogate,  discussions with consultants, evaluation of patient's response to  treatment, examination of patient, obtaining history from patient or  surrogate, ordering and performing treatments and interventions, ordering  and review of laboratory studies and re-evaluation of patient's condition   I assumed direction of critical care for this patient from another  provider in my specialty: no     *This note was dictated using voice recognition software/Dragon.  Despite best efforts to proofread, errors can occur which can change the meaning.  Any change was purely unintentional.   Ottie Glazier, M.D.  [facility] [place]

## 2020-05-23 ENCOUNTER — Inpatient Hospital Stay: Payer: No Typology Code available for payment source

## 2020-05-23 DIAGNOSIS — R6520 Severe sepsis without septic shock: Secondary | ICD-10-CM

## 2020-05-23 DIAGNOSIS — A419 Sepsis, unspecified organism: Secondary | ICD-10-CM | POA: Diagnosis not present

## 2020-05-23 LAB — BLOOD GAS, ARTERIAL
Acid-base deficit: 0.1 mmol/L (ref 0.0–2.0)
Bicarbonate: 23.1 mmol/L (ref 20.0–28.0)
FIO2: 0.3
O2 Saturation: 96.5 %
PEEP: 5 cmH2O
Patient temperature: 37
Pressure control: 20 cmH2O
RATE: 16 resp/min
pCO2 arterial: 31 mmHg — ABNORMAL LOW (ref 32.0–48.0)
pH, Arterial: 7.48 — ABNORMAL HIGH (ref 7.350–7.450)
pO2, Arterial: 79 mmHg — ABNORMAL LOW (ref 83.0–108.0)

## 2020-05-23 LAB — CBC WITH DIFFERENTIAL/PLATELET
Abs Immature Granulocytes: 0.25 10*3/uL — ABNORMAL HIGH (ref 0.00–0.07)
Basophils Absolute: 0 10*3/uL (ref 0.0–0.1)
Basophils Relative: 0 %
Eosinophils Absolute: 0.6 10*3/uL — ABNORMAL HIGH (ref 0.0–0.5)
Eosinophils Relative: 5 %
HCT: 28.4 % — ABNORMAL LOW (ref 39.0–52.0)
Hemoglobin: 8.5 g/dL — ABNORMAL LOW (ref 13.0–17.0)
Immature Granulocytes: 2 %
Lymphocytes Relative: 11 %
Lymphs Abs: 1.3 10*3/uL (ref 0.7–4.0)
MCH: 28.8 pg (ref 26.0–34.0)
MCHC: 29.9 g/dL — ABNORMAL LOW (ref 30.0–36.0)
MCV: 96.3 fL (ref 80.0–100.0)
Monocytes Absolute: 0.7 10*3/uL (ref 0.1–1.0)
Monocytes Relative: 6 %
Neutro Abs: 8.6 10*3/uL — ABNORMAL HIGH (ref 1.7–7.7)
Neutrophils Relative %: 76 %
Platelets: 280 10*3/uL (ref 150–400)
RBC: 2.95 MIL/uL — ABNORMAL LOW (ref 4.22–5.81)
RDW: 18.3 % — ABNORMAL HIGH (ref 11.5–15.5)
WBC: 11.4 10*3/uL — ABNORMAL HIGH (ref 4.0–10.5)
nRBC: 0 % (ref 0.0–0.2)

## 2020-05-23 LAB — GLUCOSE, CAPILLARY
Glucose-Capillary: 191 mg/dL — ABNORMAL HIGH (ref 70–99)
Glucose-Capillary: 167 mg/dL — ABNORMAL HIGH (ref 70–99)
Glucose-Capillary: 179 mg/dL — ABNORMAL HIGH (ref 70–99)
Glucose-Capillary: 174 mg/dL — ABNORMAL HIGH (ref 70–99)
Glucose-Capillary: 165 mg/dL — ABNORMAL HIGH (ref 70–99)
Glucose-Capillary: 200 mg/dL — ABNORMAL HIGH (ref 70–99)

## 2020-05-23 LAB — BASIC METABOLIC PANEL
Anion gap: 12 (ref 5–15)
BUN: 88 mg/dL — ABNORMAL HIGH (ref 8–23)
CO2: 23 mmol/L (ref 22–32)
Calcium: 7.8 mg/dL — ABNORMAL LOW (ref 8.9–10.3)
Chloride: 112 mmol/L — ABNORMAL HIGH (ref 98–111)
Creatinine, Ser: 3.42 mg/dL — ABNORMAL HIGH (ref 0.61–1.24)
GFR, Estimated: 18 mL/min — ABNORMAL LOW (ref >60)
Glucose, Bld: 191 mg/dL — ABNORMAL HIGH (ref 70–99)
Potassium: 3.6 mmol/L (ref 3.5–5.1)
Sodium: 147 mmol/L — ABNORMAL HIGH (ref 135–145)

## 2020-05-23 LAB — MAGNESIUM: Magnesium: 2.3 mg/dL (ref 1.7–2.4)

## 2020-05-23 LAB — PHOSPHORUS: Phosphorus: 5.9 mg/dL — ABNORMAL HIGH (ref 2.5–4.6)

## 2020-05-23 MED ORDER — POTASSIUM CHLORIDE 20 MEQ PO PACK
40.0000 meq | PACK | Freq: Once | ORAL | Status: AC
  Administered 2020-05-23: 40 meq
  Filled 2020-05-23: qty 2

## 2020-05-23 NOTE — Progress Notes (Signed)
CRITICAL CARE NOTE 75yo male w/unknown past medical history who presented to the ED w/AMS and was found to be in DKA vs HSS with a blood sugar >600 and lactate 5.8  Patient background:  74yo male w/unknown past medical history who presented to the ED via EMS after the son found him unresponsive, face down in the kitchen, incontinent of urine. Per the ED, the patient son reported to them they call and check on him daily and last spoke with him yesterday. When EMS arrived, the patient was awake but became unresponsive en route to the ED.   On arrival to the ED, the patient was arousable only to painful stimuli, hypothermic 94 otherwise stable vital signs. Labs revealed a blood glucose > 600, lactate 5.8, WBC 26.2, Hgb 7.1 and VBG: 7.19/36 and bicarb 13. He was given 1L LR bolus, started on endotool and antibiotics were initiated. Rapid COVID test negative  ICU was called for admission.   05/14/20- patient with MSSA bacteremia, critially ill on mechanical ventilator. Remains on vasopressor. Insulin gtt is off today. Active GI bleed s/p PRBC transfusion. 05/15/20-patient remains hypotensive on MV. Possible EGD when more stable - spoke with GI- Dr Viona Gilmore today. Still with edema 2+ on vasopressors.  05/16/20- patient on MV. A line not functioning will dc today. Pt in sinus rhythm remains intubated septic awaiting tee for endocarditis workup.  05/11/2020 - TEE performed at bedside.  05/18/20 - not waking up off sedation, nephrology consulted 05/22/2020 - remains unresponsive off all sedation, secretions from ETT are heavy, will repeat tracheal aspirate. FiO2 is down to 30%, patient intermittetly biting on ETT during examination.  He is rhonchorous bilaterally on auscultation.  Overnight patient had non bloody vomitus and OGT feeds have been held today.  UOP overnight is adequate.  1/23 patient with severe neurological deficit  Past Medical History:  Unknown  Significant Hospital Events:   1/13-Admitted to ICU  Consults:  GI Cardiology ID Nephrology  Procedures:  ETT 1/13>> A line 1/13 - 1/16 CVL 1/13>>  Significant Diagnostic Tests:  CT head 05/13/20 Stable non contrast CT appearance of the brain. No CT evidence of anoxic injury. 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  TEE 05/04/2020 EF 02-54% RV systolic function normal No evidence of vegetation/infective endocarditis  CT head 05/20/2020 No acute abnormality Extensive chronic microvascular ischemic change  Micro Data:  Bld Cx 05/05/2020 - MSSA Bld Cx 05/14/20 - Negative  Tracheal Aspirate 05/14/20 - MRSA  Antimicrobials:  Vancomycin 1/12 - off Cefepime 1/12 - off Flagyl 1/12 - off Zosyn 1/13 - off  Cefazolin 1/13>> off  Linezolid 17 Jan  CC  follow up respiratory failure  SUBJECTIVE Patient remains critically ill Prognosis is guarded Remains on vent   BP (!) 162/79   Pulse 84   Temp 99.1 F (37.3 C)   Resp (!) 21   Ht $R'5\' 8"'zw$  (1.727 m)   Wt 106.4 kg   SpO2 100%   BMI 35.67 kg/m    I/O last 3 completed shifts: In: 384.3 [I.V.:14; IV Piggyback:370.3] Out: 3550 [Urine:3400; Stool:150] No intake/output data recorded.  SpO2: 100 % FiO2 (%): 30 %  Estimated body mass index is 35.67 kg/m as calculated from the following:   Height as of this encounter: $RemoveBeforeD'5\' 8"'XdwoMNSrcXQGvD$  (1.727 m).   Weight as of this encounter: 106.4 kg.   REVIEW OF SYSTEMS  PATIENT IS UNABLE TO PROVIDE COMPLETE REVIEW OF SYSTEMS DUE TO SEVERE CRITICAL ILLNESS  PHYSICAL EXAMINATION:  GENERAL:critically ill appearing, +resp distress HEAD: Normocephalic, atraumatic.  EYES: Pupils equal, round, reactive to light.  No scleral icterus.  MOUTH: Moist mucosal membrane. NECK: Supple.  PULMONARY: +rhonchi, +wheezing CARDIOVASCULAR: S1 and S2. Regular rate and rhythm. No murmurs, rubs, or gallops.  GASTROINTESTINAL: Soft, nontender, -distended.  Positive bowel sounds.   MUSCULOSKELETAL: No  swelling, clubbing, or edema.  NEUROLOGIC: obtunded, GCS<8 SKIN:intact,warm,dry  MEDICATIONS: I have reviewed all medications and confirmed regimen as documented   CULTURE RESULTS   Recent Results (from the past 240 hour(s))  Culture, blood (Routine X 2) w Reflex to ID Panel     Status: None   Collection Time: 05/14/20  2:41 PM   Specimen: BLOOD  Result Value Ref Range Status   Specimen Description BLOOD RIGHT ANTECUBITAL  Final   Special Requests   Final    BOTTLES DRAWN AEROBIC AND ANAEROBIC Blood Culture adequate volume   Culture   Final    NO GROWTH 5 DAYS Performed at [facility], [address] [place], [place] [address]    Report Status 05/19/2020 FINAL  Final  Culture, blood (Routine X 2) w Reflex to ID Panel     Status: None   Collection Time: 05/14/20  2:55 PM   Specimen: BLOOD  Result Value Ref Range Status   Specimen Description BLOOD BLOOD RIGHT HAND  Final   Special Requests   Final    BOTTLES DRAWN AEROBIC AND ANAEROBIC Blood Culture results may not be optimal due to an excessive volume of blood received in culture bottles   Culture   Final    NO GROWTH 5 DAYS Performed at [facility], [address], [place], [place] [address]    Report Status 05/19/2020 FINAL  Final  Culture, respiratory (non-expectorated)     Status: None   Collection Time: 05/14/20  5:34 PM   Specimen: Tracheal Aspirate; Respiratory  Result Value Ref Range Status   Specimen Description   Final    TRACHEAL ASPIRATE Performed at [facility], [address] [place], [place] [address]    Special Requests   Final    NONE Performed at [facility], [address] [place], [place] [address]    Gram Stain   Final    MODERATE WBC PRESENT, PREDOMINANTLY PMN FEW GRAM POSITIVE COCCI IN CLUSTERS Performed at [facility], [place] [address] [place],  [address]    Culture RARE METHICILLIN RESISTANT STAPHYLOCOCCUS AUREUS  Final   Report  Status 05/06/2020 FINAL  Final   Organism ID, Bacteria METHICILLIN RESISTANT STAPHYLOCOCCUS AUREUS  Final      Susceptibility   Methicillin resistant staphylococcus aureus - MIC*    CIPROFLOXACIN >=8 RESISTANT Resistant     ERYTHROMYCIN >=8 RESISTANT Resistant     GENTAMICIN <=0.5 SENSITIVE Sensitive     OXACILLIN >=4 RESISTANT Resistant     TETRACYCLINE <=1 SENSITIVE Sensitive     VANCOMYCIN <=0.5 SENSITIVE Sensitive     TRIMETH/SULFA <=10 SENSITIVE Sensitive     CLINDAMYCIN >=8 RESISTANT Resistant     RIFAMPIN <=0.5 SENSITIVE Sensitive     Inducible Clindamycin NEGATIVE Sensitive     * RARE METHICILLIN RESISTANT STAPHYLOCOCCUS AUREUS  MRSA PCR Screening     Status: Abnormal   Collection Time: 05/14/20  9:04 PM   Specimen: Nasopharyngeal  Result Value Ref Range Status   MRSA by PCR POSITIVE (A) NEGATIVE Final    Comment:        The  GeneXpert MRSA Assay (FDA approved for NASAL specimens only), is one component of a comprehensive MRSA colonization surveillance program. It is not intended to diagnose MRSA infection nor to guide or monitor treatment for MRSA infections. RESULT CALLED TO, READ BACK BY AND VERIFIED WITH: MELISSA COBB AT [phone] 05/15/20.PMF Performed at [facility], [address] [place], [place] [address]   Culture, respiratory     Status: None (Preliminary result)   Collection Time: 05/22/20 11:26 AM   Specimen: Tracheal Aspirate; Respiratory  Result Value Ref Range Status   Specimen Description   Final    TRACHEAL ASPIRATE Performed at [facility], [address], [place], [place] [address]    Special Requests   Final    NONE Performed at [facility], [address] [place], [place] [address]    Gram Stain   Final    RARE WBC PRESENT, PREDOMINANTLY PMN NO ORGANISMS SEEN Performed at [facility], [place] [address], [place], [place] [address]    Culture PENDING  Incomplete   Report Status PENDING  Incomplete       CBC    Component Value Date/Time   WBC 11.4 (H) 05/23/2020 0443   RBC 2.95 (L) 05/23/2020 0443   HGB 8.5 (L) 05/23/2020 0443   HCT 28.4 (L) 05/23/2020 0443   PLT 280 05/23/2020 0443   MCV 96.3 05/23/2020 0443   MCH 28.8 05/23/2020 0443   MCHC 29.9 (L) 05/23/2020 0443   RDW 18.3 (H) 05/23/2020 0443   LYMPHSABS 1.3 05/23/2020 0443   MONOABS 0.7 05/23/2020 0443   EOSABS 0.6 (H) 05/23/2020 0443   BASOSABS 0.0 05/23/2020 0443   BMP Latest Ref Rng & Units 05/22/2020 05/21/2020 05/20/2020  Glucose 70 - 99 mg/dL 103(H) 210(H) 242(H)  BUN 8 - 23 mg/dL 94(H) 98(H) 92(H)  Creatinine 0.61 - 1.24 mg/dL 3.67(H) 4.15(H) 4.47(H)  Sodium 135 - 145 mmol/L 150(H) 151(H) 150(H)  Potassium 3.5 - 5.1 mmol/L 3.4(L) 3.6 3.1(L)  Chloride 98 - 111 mmol/L 115(H) 114(H) 116(H)  CO2 22 - 32 mmol/L 22 21(L) 24  Calcium 8.9 - 10.3 mg/dL 7.6(L) 8.1(L) 7.6(L)       Nutrition Status: Nutrition Problem: Inadequate oral intake Etiology: inability to eat Signs/Symptoms: NPO status Interventions: Prostat,Tube feeding,MVI     Indwelling Urinary Catheter continued, requirement due to   Reason to continue Indwelling Urinary Catheter strict Intake/Output monitoring for hemodynamic instability   Central Line/ continued, requirement due to  Reason to continue [place] of central venous pressure or other hemodynamic parameters and poor IV access   Ventilator continued, requirement due to severe respiratory failure   Ventilator Sedation RASS 0 to -2      ASSESSMENT AND PLAN SYNOPSIS  74 yo white male with acute MRSA pneumonia and MSSA bacteremia with severe DKA and acidosis leading to severe and acute hypoxic and hypercapnic resp failure with failure to wean from vent and inability to protect  airway with severe encephalopathy    Severe ACUTE Hypoxic and Hypercapnic Respiratory Failure -continue Full MV support -continue Bronchodilator Therapy -Wean Fio2 and PEEP as tolerated -will  perform SAT/SBT when respiratory parameters are met -VAP/VENT bundle implementation  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   Will certainly impact respiratory mechanics, ventilator weaning Suspect will need to consider additional PEEP   ACUTE KIDNEY INJURY/Renal Failure -continue Foley Catheter-assess need -Avoid nephrotoxic agents -Follow urine output, BMP -Ensure  adequate renal perfusion, optimize oxygenation -Renal dose medications     NEUROLOGY Acute toxic metabolic encephalopathy, need for sedation as needed Will need MRI  In next 24Hrs.   SHOCK-SEPSIS-PRESENT ON ADMISSION -use vasopressors to keep MAP>65 as needed   CARDIAC ICU monitoring  INFECTIOUS DISEASE -continue antibiotics as prescribed -follow up cultures -follow up ID consultation     GI GI PROPHYLAXIS as indicated  NUTRITIONAL STATUS Nutrition Status: Nutrition Problem: Inadequate oral intake Etiology: inability to eat Signs/Symptoms: NPO status Interventions: Prostat,Tube feeding,MVI   DIET-->TF'son hold, get ABD xrays Constipation protocol as indicated  ENDO - will use ICU hypoglycemic\Hyperglycemia protocol if indicated     ELECTROLYTES -follow labs as needed -replace as needed -pharmacy consultation and following   DVT/GI PRX ordered and assessed TRANSFUSIONS AS NEEDED MONITOR FSBS I Assessed the need for Labs I Assessed the need for Foley I Assessed the need for Central Venous Line Family Discussion when available I Assessed the need for Mobilization I made an Assessment of medications to be adjusted accordingly Safety Risk assessment completed   Patient is SD status Can transfer to [place], M.D.  [facility]  Medical Director [place] Director [place] Department

## 2020-05-23 NOTE — Progress Notes (Signed)
[facility]  ROUNDING NOTE   Subjective:   01/22 0701 - 01/23 0700 In: -  Out: 2400 [Urine:2250; Stool:150]  Creatinine 3.42 (3.67) (4.15) Na 147 (150)  UOP 2400  Objective:  Vital signs in last 24 hours:  Temp:  [99.1 F (37.3 C)-99.9 F (37.7 C)] 99.32 F (37.4 C) (01/23 0900) Pulse Rate:  [80-95] 85 (01/23 0900) Resp:  [17-22] 19 (01/23 0900) BP: (114-162)/(60-104) 150/77 (01/23 0900) SpO2:  [95 %-100 %] 100 % (01/23 0940) FiO2 (%):  [30 %] 30 % (01/23 0940) Weight:  [106.4 kg] 106.4 kg (01/23 0500)  Weight change: -1.4 kg Filed Weights   05/21/20 0459 05/22/20 0500 05/23/20 0500  Weight: 108.5 kg 107.8 kg 106.4 kg    Intake/Output: I/O last 3 completed shifts: In: 384.3 [I.V.:14; IV Piggyback:370.3] Out: 3550 [Urine:3400; Stool:150]   Intake/Output this shift:  Total I/O In: 950.1 [IV Piggyback:950.1] Out: 700 [Urine:700]  Physical Exam: General:  Critically ill  Head:  ETT  Eyes:  Anicteric  Neck:  trachea midline  Lungs:   PRVC FiO2 30%  Heart:  regular  Abdomen:   Soft, obese, bowel sounds present  Extremities:  1+ peripheral edema.  Neurologic:  Intubated, sedated  Skin:  No rash  Access:  No dialysis access    Basic Metabolic Panel: Recent Labs  Lab 05/19/20 0355 05/19/20 1500 05/20/20 0631 05/21/20 0456 05/22/20 0438 05/23/20 0443  NA 148* 150* 150* 151* 150* 147*  K 3.2* 3.5 3.1* 3.6 3.4* 3.6  CL 116* 117* 116* 114* 115* 112*  CO2 $Re'22 22 24 'qWx$ 21* 22 23  GLUCOSE 383* 325* 242* 210* 103* 191*  BUN 95* 93* 92* 98* 94* 88*  CREATININE 4.69* 4.58* 4.47* 4.15* 3.67* 3.42*  CALCIUM 7.6* 7.6* 7.6* 8.1* 7.6* 7.8*  MG 2.1  --  2.0 2.5* 2.3 2.3  PHOS 4.2  --  4.4 5.2* 5.4* 5.9*    Liver Function Tests: Recent Labs  Lab 05/07/2020 0326 05/18/20 0407  AST 15 12*  ALT <5 <5  ALKPHOS 58 65  BILITOT 0.6 0.5  PROT 5.1* 5.1*  ALBUMIN 1.7* 1.6*   No results for input(s): LIPASE, AMYLASE in the last 168 hours. Recent Labs   Lab 05/21/20 1207  AMMONIA 17    CBC: Recent Labs  Lab 05/19/20 0355 05/19/20 1120 05/19/20 2348 05/20/20 0631 05/21/20 0456 05/22/20 0438 05/23/20 0443  WBC 11.5*  --   --  13.4* 12.3* 11.7* 11.4*  NEUTROABS 7.8*  --   --  9.2* 8.4* 8.2* 8.6*  HGB 9.0*   < > 9.3* 9.7* 9.2* 8.4* 8.5*  HCT 29.2*   < > 29.2* 30.4* 29.3* 27.3* 28.4*  MCV 94.8  --   --  93.8 94.2 96.1 96.3  PLT 208  --   --  259 286 270 280   < > = values in this interval not displayed.    Cardiac Enzymes: No results for input(s): CKTOTAL, CKMB, CKMBINDEX, TROPONINI in the last 168 hours.  BNP: Invalid input(s): POCBNP  CBG: Recent Labs  Lab 05/22/20 1907 05/22/20 2325 05/23/20 0309 05/23/20 0709 05/23/20 1121  GLUCAP 115* 164* 191* 167* 179*    Microbiology: Results for orders placed or performed during the hospital encounter of 05/28/2020  Blood culture (routine single)     Status: Abnormal   Collection Time: 05/03/2020 10:24 PM   Specimen: BLOOD  Result Value Ref Range Status   Specimen Description   Final    BLOOD LEFT ASSIST  CONTROL Performed at [facility], [address] [place], [place] [address]    Special Requests   Final    BOTTLES DRAWN AEROBIC AND ANAEROBIC Blood Culture adequate volume Performed at [facility], [address] [place], [place] [address]    Culture  Setup Time   Final    Organism ID to follow GRAM POSITIVE COCCI IN BOTH AEROBIC AND ANAEROBIC BOTTLES CRITICAL RESULT CALLED TO, READ BACK BY AND VERIFIED WITH: MYRA SLAUGHTER AT 1309 ON 05/13/20 SNG GRAM NEGATIVE RODS ANAEROBIC BOTTLE ONLY CRITICAL RESULT CALLED TO, READ BACK BY AND VERIFIED WITH: MORGAN HICKS PHARMD $[email or web address]$  05/14/20 EB Performed at [facility], [place] [address] [place], [place] [address]    Culture STAPHYLOCOCCUS AUREUS PROTEUS MIRABILIS  (A)  Final   Report Status 05/16/2020 FINAL  Final   Organism ID, Bacteria STAPHYLOCOCCUS AUREUS  Final   Organism ID, Bacteria  PROTEUS MIRABILIS  Final      Susceptibility   Proteus mirabilis - MIC*    AMPICILLIN <=2 SENSITIVE Sensitive     CEFAZOLIN <=4 SENSITIVE Sensitive     CEFEPIME <=0.12 SENSITIVE Sensitive     CEFTAZIDIME <=1 SENSITIVE Sensitive     CEFTRIAXONE <=0.25 SENSITIVE Sensitive     CIPROFLOXACIN <=0.25 SENSITIVE Sensitive     GENTAMICIN <=1 SENSITIVE Sensitive     IMIPENEM 8 INTERMEDIATE Intermediate     TRIMETH/SULFA <=20 SENSITIVE Sensitive     AMPICILLIN/SULBACTAM <=2 SENSITIVE Sensitive     PIP/TAZO <=4 SENSITIVE Sensitive     * PROTEUS MIRABILIS   Staphylococcus aureus - MIC*    CIPROFLOXACIN <=0.5 SENSITIVE Sensitive     ERYTHROMYCIN <=0.25 SENSITIVE Sensitive     GENTAMICIN <=0.5 SENSITIVE Sensitive     OXACILLIN 0.5 SENSITIVE Sensitive     TETRACYCLINE <=1 SENSITIVE Sensitive     VANCOMYCIN 1 SENSITIVE Sensitive     TRIMETH/SULFA <=10 SENSITIVE Sensitive     CLINDAMYCIN <=0.25 SENSITIVE Sensitive     RIFAMPIN <=0.5 SENSITIVE Sensitive     Inducible Clindamycin NEGATIVE Sensitive     * STAPHYLOCOCCUS AUREUS  Culture, blood (single)     Status: None   Collection Time: 05/27/2020 10:24 PM   Specimen: BLOOD  Result Value Ref Range Status   Specimen Description BLOOD LEFT HAND  Final   Special Requests   Final    BOTTLES DRAWN AEROBIC AND ANAEROBIC Blood Culture adequate volume   Culture   Final    NO GROWTH 5 DAYS Performed at [facility], [address] [place], [place] [address]    Report Status 05/17/2020 FINAL  Final  Blood Culture ID Panel (Reflexed)     Status: Abnormal   Collection Time: 05/17/2020 10:24 PM  Result Value Ref Range Status   Enterococcus faecalis NOT DETECTED NOT DETECTED Final   Enterococcus Faecium NOT DETECTED NOT DETECTED Final   Listeria monocytogenes NOT DETECTED NOT DETECTED Final   Staphylococcus species DETECTED (A) NOT DETECTED Final    Comment: CRITICAL RESULT CALLED TO, READ BACK BY AND VERIFIED WITH: MYRA SLAUGHTER AT 1309 ON  05/13/20 SNG    Staphylococcus aureus (BCID) DETECTED (A) NOT DETECTED Final    Comment: CRITICAL RESULT CALLED TO, READ BACK BY AND VERIFIED WITH: MYRA SLAUGHTER AT 1309 05/13/20 SNG    Staphylococcus epidermidis NOT DETECTED NOT DETECTED Final   Staphylococcus lugdunensis NOT DETECTED NOT DETECTED Final   Streptococcus species NOT DETECTED NOT DETECTED Final   Streptococcus agalactiae NOT DETECTED NOT DETECTED Final  Streptococcus pneumoniae NOT DETECTED NOT DETECTED Final   Streptococcus pyogenes NOT DETECTED NOT DETECTED Final   A.calcoaceticus-baumannii NOT DETECTED NOT DETECTED Final   Bacteroides fragilis NOT DETECTED NOT DETECTED Final   Enterobacterales NOT DETECTED NOT DETECTED Final   Enterobacter cloacae complex NOT DETECTED NOT DETECTED Final   Escherichia coli NOT DETECTED NOT DETECTED Final   Klebsiella aerogenes NOT DETECTED NOT DETECTED Final   Klebsiella oxytoca NOT DETECTED NOT DETECTED Final   Klebsiella pneumoniae NOT DETECTED NOT DETECTED Final   Proteus species NOT DETECTED NOT DETECTED Final   Salmonella species NOT DETECTED NOT DETECTED Final   Serratia marcescens NOT DETECTED NOT DETECTED Final   Haemophilus influenzae NOT DETECTED NOT DETECTED Final   Neisseria meningitidis NOT DETECTED NOT DETECTED Final   Pseudomonas aeruginosa NOT DETECTED NOT DETECTED Final   Stenotrophomonas maltophilia NOT DETECTED NOT DETECTED Final   Candida albicans NOT DETECTED NOT DETECTED Final   Candida auris NOT DETECTED NOT DETECTED Final   Candida glabrata NOT DETECTED NOT DETECTED Final   Candida krusei NOT DETECTED NOT DETECTED Final   Candida parapsilosis NOT DETECTED NOT DETECTED Final   Candida tropicalis NOT DETECTED NOT DETECTED Final   Cryptococcus neoformans/gattii NOT DETECTED NOT DETECTED Final   Meth resistant mecA/C and MREJ NOT DETECTED NOT DETECTED Final    Comment: Performed at [facility], [address] [place], [place] [address]  Urine  culture     Status: None   Collection Time: 05/13/20 12:26 AM   Specimen: In/Out Cath Urine  Result Value Ref Range Status   Specimen Description   Final    IN/OUT CATH URINE Performed at [facility], [address], [place], [place] [address]    Special Requests   Final    NONE Performed at [facility], [address], [place], [place] [address]    Culture   Final    NO GROWTH Performed at [facility], [address], [place], [place] [address]    Report Status 05/14/2020 FINAL  Final  Resp Panel by RT-PCR (Flu A&B, Covid) Nasopharyngeal Swab     Status: None   Collection Time: 05/13/20 12:26 AM   Specimen: Nasopharyngeal Swab; Nasopharyngeal(NP) swabs in vial transport medium  Result Value Ref Range Status   SARS Coronavirus 2 by RT PCR NEGATIVE NEGATIVE Final    Comment: (NOTE) SARS-CoV-2 target nucleic acids are NOT DETECTED.  The SARS-CoV-2 RNA is generally detectable in upper respiratory specimens during the acute phase of infection. The lowest concentration of SARS-CoV-2 viral copies this assay can detect is 138 copies/mL. A negative result does not preclude SARS-Cov-2 infection and should not be used as the sole basis for treatment or other patient management decisions. A negative result may occur with  improper specimen collection/handling, submission of specimen other than nasopharyngeal swab, presence of viral mutation(s) within the areas targeted by this assay, and inadequate number of viral copies(<138 copies/mL). A negative result must be combined with clinical observations, patient history, and epidemiological information. The expected result is Negative.  Fact Sheet for Patients:  EntrepreneurPulse.com.au  Fact Sheet for Healthcare Providers:  IncredibleEmployment.be  This test is no t yet approved or cleared by the [place] FDA and  has been authorized for detection and/or diagnosis of  SARS-CoV-2 by FDA under an Emergency Use Authorization (EUA). This EUA will remain  in effect (meaning this test can be used) for the duration of the COVID-19 declaration under Section 564(b)(1) of the  Act, 21 U.S.C.section 360bbb-3(b)(1), unless the authorization is terminated  or revoked sooner.       Influenza A by PCR NEGATIVE NEGATIVE Final   Influenza B by PCR NEGATIVE NEGATIVE Final    Comment: (NOTE) The Xpert Xpress SARS-CoV-2/FLU/RSV plus assay is intended as an aid in the diagnosis of influenza from Nasopharyngeal swab specimens and should not be used as a sole basis for treatment. Nasal washings and aspirates are unacceptable for Xpert Xpress SARS-CoV-2/FLU/RSV testing.  Fact Sheet for Patients: EntrepreneurPulse.com.au  Fact Sheet for Healthcare Providers: IncredibleEmployment.be  This test is not yet approved or cleared by the [place] FDA and has been authorized for detection and/or diagnosis of SARS-CoV-2 by FDA under an Emergency Use Authorization (EUA). This EUA will remain in effect (meaning this test can be used) for the duration of the COVID-19 declaration under Section 564(b)(1) of the Act, 21 U.S.C. section 360bbb-3(b)(1), unless the authorization is terminated or revoked.  Performed at [facility], [address] [place], [place] [address]   Culture, blood (Routine X 2) w Reflex to ID Panel     Status: None   Collection Time: 05/14/20  2:41 PM   Specimen: BLOOD  Result Value Ref Range Status   Specimen Description BLOOD RIGHT ANTECUBITAL  Final   Special Requests   Final    BOTTLES DRAWN AEROBIC AND ANAEROBIC Blood Culture adequate volume   Culture   Final    NO GROWTH 5 DAYS Performed at [facility], [address] [place], [place] [address]    Report Status 05/19/2020 FINAL  Final  Culture, blood (Routine X 2) w Reflex to ID Panel     Status: None   Collection Time: 05/14/20  2:55  PM   Specimen: BLOOD  Result Value Ref Range Status   Specimen Description BLOOD BLOOD RIGHT HAND  Final   Special Requests   Final    BOTTLES DRAWN AEROBIC AND ANAEROBIC Blood Culture results may not be optimal due to an excessive volume of blood received in culture bottles   Culture   Final    NO GROWTH 5 DAYS Performed at [facility], [address] [place], [place] [address]    Report Status 05/19/2020 FINAL  Final  Culture, respiratory (non-expectorated)     Status: None   Collection Time: 05/14/20  5:34 PM   Specimen: Tracheal Aspirate; Respiratory  Result Value Ref Range Status   Specimen Description   Final    TRACHEAL ASPIRATE Performed at [facility], [address] [place], [place] [address]    Special Requests   Final    NONE Performed at [facility], [address] [place],  [address]    Gram Stain   Final    MODERATE WBC PRESENT, PREDOMINANTLY PMN FEW GRAM POSITIVE COCCI IN CLUSTERS Performed at [facility], [place] [address] [place],  [address]    Culture RARE METHICILLIN RESISTANT STAPHYLOCOCCUS AUREUS  Final   Report Status 05/07/2020 FINAL  Final   Organism ID, Bacteria METHICILLIN RESISTANT STAPHYLOCOCCUS AUREUS  Final      Susceptibility   Methicillin resistant staphylococcus aureus - MIC*    CIPROFLOXACIN >=8 RESISTANT Resistant     ERYTHROMYCIN >=8 RESISTANT Resistant     GENTAMICIN <=0.5 SENSITIVE Sensitive     OXACILLIN >=4 RESISTANT Resistant     TETRACYCLINE <=1 SENSITIVE Sensitive     VANCOMYCIN <=0.5 SENSITIVE Sensitive     TRIMETH/SULFA <=10 SENSITIVE Sensitive  CLINDAMYCIN >=8 RESISTANT Resistant     RIFAMPIN <=0.5 SENSITIVE Sensitive     Inducible Clindamycin NEGATIVE Sensitive     * RARE METHICILLIN RESISTANT STAPHYLOCOCCUS AUREUS  MRSA PCR Screening     Status: Abnormal   Collection Time: 05/14/20  9:04 PM   Specimen: Nasopharyngeal  Result Value Ref Range Status   MRSA by PCR  POSITIVE (A) NEGATIVE Final    Comment:        The GeneXpert MRSA Assay (FDA approved for NASAL specimens only), is one component of a comprehensive MRSA colonization surveillance program. It is not intended to diagnose MRSA infection nor to guide or monitor treatment for MRSA infections. RESULT CALLED TO, READ BACK BY AND VERIFIED WITH: MELISSA COBB AT [id number] 05/15/20.PMF Performed at [facility], [address] [place], [place] [address]   Culture, respiratory     Status: None (Preliminary result)   Collection Time: 05/22/20 11:26 AM   Specimen: Tracheal Aspirate; Respiratory  Result Value Ref Range Status   Specimen Description   Final    TRACHEAL ASPIRATE Performed at [facility], [address] [place], [place] [address]    Special Requests   Final    NONE Performed at [facility], [address] [place], [place] [address]    Gram Stain   Final    RARE WBC PRESENT, PREDOMINANTLY PMN NO ORGANISMS SEEN    Culture   Final    NO GROWTH < 24 HOURS Performed at [facility] [address], [place],  [address]    Report Status PENDING  Incomplete    Coagulation Studies: No results for input(s): LABPROT, INR in the last 72 hours.  Urinalysis: No results for input(s): COLORURINE, LABSPEC, PHURINE, GLUCOSEU, HGBUR, BILIRUBINUR, KETONESUR, PROTEINUR, UROBILINOGEN, NITRITE, LEUKOCYTESUR in the last 72 hours.  Invalid input(s): APPERANCEUR    Imaging: DG Chest Port 1 View  Result Date: 05/22/2020 CLINICAL DATA:  Vomiting EXAM: PORTABLE CHEST 1 VIEW COMPARISON:  05/19/2020 FINDINGS: Endotracheal tube seen 4.6 cm above the carina. Nasogastric tube extends into the upper abdomen beyond the margin of the examination. Right internal jugular central venous catheter tip noted at the superior cavoatrial junction. Progressive retrocardiac opacification may relate to atelectasis or developing infiltrate within this region. The lungs are  otherwise clear. No pneumothorax or pleural effusion. Cardiac size within normal limits. 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 The pulmonary vascularity is normal. IMPRESSION: Stable support lines and tubes. Progressive retrocardiac opacification, possibly representing developing atelectasis or infiltrate within this region. Electronically Signed   By: Fidela Salisbury MD   On: 05/22/2020 06:01   DG Abd Portable 1V  Result Date: 05/23/2020 CLINICAL DATA:  Ileus. EXAM: PORTABLE ABDOMEN - 1 VIEW COMPARISON:  Abdominal radiograph 05/30/2020. FINDINGS: Enteric tube tip and side-port project over the stomach. Gas is demonstrated within nondilated loops of large and small bowel within the abdomen. Supine evaluation limited for the detection of free intraperitoneal air. IMPRESSION: Nonobstructive bowel gas pattern. Electronically Signed   By: Lovey Newcomer M.D.   On: 05/23/2020 10:33     Medications:   . sodium chloride Stopped (05/13/20 0308)  . sodium chloride Stopped (05/20/20 2308)  . albumin human 60 mL/hr at 05/23/20 1038  . [START ON 05/24/2020]  ceFAZolin (ANCEF) IV    . dexmedetomidine (PRECEDEX) IV infusion Stopped (05/22/20 0428)  . feeding supplement (VITAL AF 1.2 CAL) 1,000 mL (05/22/20 1629)  . linezolid (ZYVOX) IV 300 mL/hr at 05/23/20 1038   .  chlorhexidine gluconate (MEDLINE KIT)  15 mL Mouth Rinse BID  . Chlorhexidine Gluconate Cloth  6 each Topical Q0600  . feeding supplement (PROSource TF)  90 mL Per Tube TID  . free water  200 mL Per Tube Q4H  . furosemide  40 mg Intravenous Daily  . glycopyrrolate  0.1 mg Intravenous BID  . insulin aspart  0-15 Units Subcutaneous Q4H  . insulin detemir  44 Units Subcutaneous Q12H  . mouth rinse  15 mL Mouth Rinse 10 times per day  . multivitamin with minerals  1 tablet Per Tube Daily  . pantoprazole  40 mg Intravenous Q12H  . thiamine injection  100 mg Intravenous Daily   sodium chloride, acetaminophen (TYLENOL) oral liquid  160 mg/5 mL, acetaminophen, dextrose, docusate, fentaNYL (SUBLIMAZE) injection, metoprolol tartrate, polyethylene glycol  Assessment/ Plan:    [name] is a 75 y.o. white male with coronary artery disease, diabetes mellitus type 2, history of AICD placement who was admitted to [facility] on 05/30/2020 for evaluation of altered mental status and DKA  1.  Acute kidney injury, baseline creatinine currently unknown. Renal ultrasound consistent with chronic kidney disease.  2. Proteinuria and glycosuria consistent with diabetic nephropathy.  3.  Hypernatremia. Free water deficit of 2.7 liters 4.  Acute respiratory failure secondary to MRSA pneumonia  5. Sepsis with MSSA  And proteus mirabilis bacteremia.  Plan: No indication for dialysis.  - remains on furosemide - Continue free water flushes as long as NGT functioning.  - Will consider dextrose infusion if no improvement.    LOS: [place] 1/23/202212:44 PM

## 2020-05-23 NOTE — Progress Notes (Signed)
Son Laird Runnion calls daily for updates on pt status from the nurses, respectfully requests an update from a doctor or practitioner in regards to medical prognosis and continuing plan of care on "Monday or Tuesday".

## 2020-05-23 NOTE — Progress Notes (Signed)
PHARMACY CONSULT NOTE  Pharmacy Consult for Electrolyte Monitoring and Replacement   Recent Labs: Potassium (mmol/L)  Date Value  05/23/2020 3.6   Magnesium (mg/dL)  Date Value  48/25/0037 2.3   Calcium (mg/dL)  Date Value  04/88/8916 7.8 (L)   Albumin (g/dL)  Date Value  94/50/3888 1.6 (L)   Phosphorus (mg/dL)  Date Value  28/00/3491 5.9 (H)   Sodium (mmol/L)  Date Value  05/23/2020 147 (H)   Corrected Ca: 9.5 mg/dL  Assessment: 75 year old male admitted with metabolic acidosis s/t DKA vs HHS. Code Blue called after arrival to ICU and patient intubated. Blood cultures positive for MSSA.  Pharmacy to manage electrolytes.  Nutrition: ProSource 90 mL TID + Vital 45 mL/hr Diuretics: furosemide 40 mg IV daily starting 1/22  Goal of Therapy:  Electrolytes WNL  Plan:   Hypernatremia: Continue free water flushes 200 mL q4h   40 mEq KCl per tube x 1  Re-check electrolytes in am  Lowella Bandy ,PharmD Clinical Pharmacist 05/23/2020 9:24 AM

## 2020-05-24 ENCOUNTER — Inpatient Hospital Stay: Payer: No Typology Code available for payment source

## 2020-05-24 ENCOUNTER — Encounter: Payer: Self-pay | Admitting: Internal Medicine

## 2020-05-24 DIAGNOSIS — N179 Acute kidney failure, unspecified: Secondary | ICD-10-CM | POA: Diagnosis not present

## 2020-05-24 DIAGNOSIS — R7881 Bacteremia: Secondary | ICD-10-CM | POA: Diagnosis not present

## 2020-05-24 DIAGNOSIS — I484 Atypical atrial flutter: Secondary | ICD-10-CM | POA: Diagnosis not present

## 2020-05-24 DIAGNOSIS — J9601 Acute respiratory failure with hypoxia: Secondary | ICD-10-CM | POA: Diagnosis not present

## 2020-05-24 DIAGNOSIS — Z95810 Presence of automatic (implantable) cardiac defibrillator: Secondary | ICD-10-CM | POA: Diagnosis not present

## 2020-05-24 LAB — CBC WITH DIFFERENTIAL/PLATELET
Abs Immature Granulocytes: 0.15 10*3/uL — ABNORMAL HIGH (ref 0.00–0.07)
Basophils Absolute: 0 10*3/uL (ref 0.0–0.1)
Basophils Relative: 0 %
Eosinophils Absolute: 0.6 10*3/uL — ABNORMAL HIGH (ref 0.0–0.5)
Eosinophils Relative: 6 %
HCT: 28.1 % — ABNORMAL LOW (ref 39.0–52.0)
Hemoglobin: 8.7 g/dL — ABNORMAL LOW (ref 13.0–17.0)
Immature Granulocytes: 2 %
Lymphocytes Relative: 12 %
Lymphs Abs: 1.2 10*3/uL (ref 0.7–4.0)
MCH: 29.8 pg (ref 26.0–34.0)
MCHC: 31 g/dL (ref 30.0–36.0)
MCV: 96.2 fL (ref 80.0–100.0)
Monocytes Absolute: 0.7 10*3/uL (ref 0.1–1.0)
Monocytes Relative: 8 %
Neutro Abs: 7.3 10*3/uL (ref 1.7–7.7)
Neutrophils Relative %: 72 %
Platelets: 293 10*3/uL (ref 150–400)
RBC: 2.92 MIL/uL — ABNORMAL LOW (ref 4.22–5.81)
RDW: 18 % — ABNORMAL HIGH (ref 11.5–15.5)
WBC: 9.9 10*3/uL (ref 4.0–10.5)
nRBC: 0 % (ref 0.0–0.2)

## 2020-05-24 LAB — GLUCOSE, CAPILLARY
Glucose-Capillary: 137 mg/dL — ABNORMAL HIGH (ref 70–99)
Glucose-Capillary: 147 mg/dL — ABNORMAL HIGH (ref 70–99)
Glucose-Capillary: 163 mg/dL — ABNORMAL HIGH (ref 70–99)
Glucose-Capillary: 173 mg/dL — ABNORMAL HIGH (ref 70–99)
Glucose-Capillary: 175 mg/dL — ABNORMAL HIGH (ref 70–99)
Glucose-Capillary: 178 mg/dL — ABNORMAL HIGH (ref 70–99)

## 2020-05-24 LAB — CULTURE, RESPIRATORY W GRAM STAIN: Culture: NO GROWTH

## 2020-05-24 LAB — BASIC METABOLIC PANEL
Anion gap: 14 (ref 5–15)
BUN: 89 mg/dL — ABNORMAL HIGH (ref 8–23)
CO2: 23 mmol/L (ref 22–32)
Calcium: 8.2 mg/dL — ABNORMAL LOW (ref 8.9–10.3)
Chloride: 109 mmol/L (ref 98–111)
Creatinine, Ser: 3.23 mg/dL — ABNORMAL HIGH (ref 0.61–1.24)
GFR, Estimated: 19 mL/min — ABNORMAL LOW (ref >60)
Glucose, Bld: 159 mg/dL — ABNORMAL HIGH (ref 70–99)
Potassium: 3.8 mmol/L (ref 3.5–5.1)
Sodium: 146 mmol/L — ABNORMAL HIGH (ref 135–145)

## 2020-05-24 LAB — PHOSPHORUS: Phosphorus: 7.2 mg/dL — ABNORMAL HIGH (ref 2.5–4.6)

## 2020-05-24 LAB — MAGNESIUM: Magnesium: 2.5 mg/dL — ABNORMAL HIGH (ref 1.7–2.4)

## 2020-05-24 MED ORDER — PANTOPRAZOLE SODIUM 40 MG IV SOLR
40.0000 mg | Freq: Every day | INTRAVENOUS | Status: DC
  Administered 2020-05-25 – 2020-05-28 (×4): 40 mg via INTRAVENOUS
  Filled 2020-05-24 (×4): qty 40

## 2020-05-24 MED ORDER — FENTANYL CITRATE (PF) 100 MCG/2ML IJ SOLN
100.0000 ug | INTRAMUSCULAR | Status: AC
  Administered 2020-05-24: 100 ug via INTRAVENOUS
  Filled 2020-05-24: qty 2

## 2020-05-24 MED ORDER — MIDAZOLAM HCL 2 MG/2ML IJ SOLN
4.0000 mg | INTRAMUSCULAR | Status: AC
  Administered 2020-05-25: 4 mg via INTRAVENOUS

## 2020-05-24 MED ORDER — MIDAZOLAM HCL 2 MG/2ML IJ SOLN
INTRAMUSCULAR | Status: AC
  Administered 2020-05-24: 4 mg
  Filled 2020-05-24: qty 4

## 2020-05-24 NOTE — Progress Notes (Signed)
[facility]  ROUNDING NOTE   Subjective:   01/23 0701 - 01/24 0700 In: 1146.5 [IV Piggyback:1146.5] Out: 1610 [Urine:2350; Stool:250]  Creatinine 3.23( 3.42) (3.67) (4.15) Na 146 (147) (150)  UOP 2343mL.   Objective:  Vital signs in last 24 hours:  Temp:  [99.1 F (37.3 C)-99.9 F (37.7 C)] 99.1 F (37.3 C) (01/24 0600) Pulse Rate:  [83-109] 85 (01/24 0600) Resp:  [19-26] 23 (01/24 0600) BP: (108-158)/(66-97) 119/66 (01/24 0600) SpO2:  [91 %-100 %] 99 % (01/24 0650) FiO2 (%):  [30 %] 30 % (01/24 0650)  Weight change:  Filed Weights   05/21/20 0459 05/22/20 0500 05/23/20 0500  Weight: 108.5 kg 107.8 kg 106.4 kg    Intake/Output: I/O last 3 completed shifts: In: 1146.5 [IV Piggyback:1146.5] Out: 3400 [Urine:3150; Stool:250]   Intake/Output this shift:  Total I/O In: -  Out: 400 [Urine:400]  Physical Exam: General:  Critically ill  Head:  ETT  Eyes:  Anicteric  Neck:  trachea midline  Lungs:   Pressure Control FiO2 30%  Heart:  regular  Abdomen:   Soft, obese, bowel sounds present  Extremities:  ++ peripheral edema.  Neurologic:  Intubated, sedated  Skin:  No rash  Access:  No dialysis access    Basic Metabolic Panel: Recent Labs  Lab 05/20/20 0631 05/21/20 0456 05/22/20 0438 05/23/20 0443 05/24/20 0512  NA 150* 151* 150* 147* 146*  K 3.1* 3.6 3.4* 3.6 3.8  CL 116* 114* 115* 112* 109  CO2 24 21* $Remo'22 23 23  'HsgGj$ GLUCOSE 242* 210* 103* 191* 159*  BUN 92* 98* 94* 88* 89*  CREATININE 4.47* 4.15* 3.67* 3.42* 3.23*  CALCIUM 7.6* 8.1* 7.6* 7.8* 8.2*  MG 2.0 2.5* 2.3 2.3 2.5*  PHOS 4.4 5.2* 5.4* 5.9* 7.2*    Liver Function Tests: Recent Labs  Lab 05/18/20 0407  AST 12*  ALT <5  ALKPHOS 65  BILITOT 0.5  PROT 5.1*  ALBUMIN 1.6*   No results for input(s): LIPASE, AMYLASE in the last 168 hours. Recent Labs  Lab 05/21/20 1207  AMMONIA 17    CBC: Recent Labs  Lab 05/20/20 0631 05/21/20 0456 05/22/20 0438 05/23/20 0443  05/24/20 0512  WBC 13.4* 12.3* 11.7* 11.4* 9.9  NEUTROABS 9.2* 8.4* 8.2* 8.6* 7.3  HGB 9.7* 9.2* 8.4* 8.5* 8.7*  HCT 30.4* 29.3* 27.3* 28.4* 28.1*  MCV 93.8 94.2 96.1 96.3 96.2  PLT 259 286 270 280 293    Cardiac Enzymes: No results for input(s): CKTOTAL, CKMB, CKMBINDEX, TROPONINI in the last 168 hours.  BNP: Invalid input(s): POCBNP  CBG: Recent Labs  Lab 05/23/20 1537 05/23/20 1921 05/23/20 2312 05/24/20 0322 05/24/20 0851  GLUCAP 174* 165* 200* 173* 147*    Microbiology: Results for orders placed or performed during the hospital encounter of 05/10/2020  Blood culture (routine single)     Status: Abnormal   Collection Time: 05/11/2020 10:24 PM   Specimen: BLOOD  Result Value Ref Range Status   Specimen Description   Final    BLOOD LEFT ASSIST CONTROL Performed at [facility], [address], [place], [place] [address]    Special Requests   Final    BOTTLES DRAWN AEROBIC AND ANAEROBIC Blood Culture adequate volume Performed at [facility], [address] [place],  [address]    Culture  Setup Time   Final    Organism ID to follow Kila CRITICAL RESULT CALLED TO, READ BACK BY AND  VERIFIED WITH: MYRA SLAUGHTER AT 1309 ON 05/13/20 SNG GRAM NEGATIVE RODS ANAEROBIC BOTTLE ONLY CRITICAL RESULT CALLED TO, READ BACK BY AND VERIFIED WITH: MORGAN HICKS PHARMD [email or web address]  05/14/20 EB Performed at [facility], [place] [address] [place], [place] [address]    Culture STAPHYLOCOCCUS AUREUS PROTEUS MIRABILIS  (A)  Final   Report Status 05/16/2020 FINAL  Final   Organism ID, Bacteria STAPHYLOCOCCUS AUREUS  Final   Organism ID, Bacteria PROTEUS MIRABILIS  Final      Susceptibility   Proteus mirabilis - MIC*    AMPICILLIN <=2 SENSITIVE Sensitive     CEFAZOLIN <=4 SENSITIVE Sensitive     CEFEPIME <=0.12 SENSITIVE Sensitive     CEFTAZIDIME <=1 SENSITIVE Sensitive     CEFTRIAXONE <=0.25 SENSITIVE  Sensitive     CIPROFLOXACIN <=0.25 SENSITIVE Sensitive     GENTAMICIN <=1 SENSITIVE Sensitive     IMIPENEM 8 INTERMEDIATE Intermediate     TRIMETH/SULFA <=20 SENSITIVE Sensitive     AMPICILLIN/SULBACTAM <=2 SENSITIVE Sensitive     PIP/TAZO <=4 SENSITIVE Sensitive     * PROTEUS MIRABILIS   Staphylococcus aureus - MIC*    CIPROFLOXACIN <=0.5 SENSITIVE Sensitive     ERYTHROMYCIN <=0.25 SENSITIVE Sensitive     GENTAMICIN <=0.5 SENSITIVE Sensitive     OXACILLIN 0.5 SENSITIVE Sensitive     TETRACYCLINE <=1 SENSITIVE Sensitive     VANCOMYCIN 1 SENSITIVE Sensitive     TRIMETH/SULFA <=10 SENSITIVE Sensitive     CLINDAMYCIN <=0.25 SENSITIVE Sensitive     RIFAMPIN <=0.5 SENSITIVE Sensitive     Inducible Clindamycin NEGATIVE Sensitive     * STAPHYLOCOCCUS AUREUS  Culture, blood (single)     Status: None   Collection Time: 05/28/2020 10:24 PM   Specimen: BLOOD  Result Value Ref Range Status   Specimen Description BLOOD LEFT HAND  Final   Special Requests   Final    BOTTLES DRAWN AEROBIC AND ANAEROBIC Blood Culture adequate volume   Culture   Final    NO GROWTH 5 DAYS Performed at [facility], [address] [place], [place] [address]    Report Status 05/19/2020 FINAL  Final  Blood Culture ID Panel (Reflexed)     Status: Abnormal   Collection Time: 05/30/2020 10:24 PM  Result Value Ref Range Status   Enterococcus faecalis NOT DETECTED NOT DETECTED Final   Enterococcus Faecium NOT DETECTED NOT DETECTED Final   Listeria monocytogenes NOT DETECTED NOT DETECTED Final   Staphylococcus species DETECTED (A) NOT DETECTED Final    Comment: CRITICAL RESULT CALLED TO, READ BACK BY AND VERIFIED WITH: MYRA SLAUGHTER AT 1309 ON 05/13/20 SNG    Staphylococcus aureus (BCID) DETECTED (A) NOT DETECTED Final    Comment: CRITICAL RESULT CALLED TO, READ BACK BY AND VERIFIED WITH: MYRA SLAUGHTER AT 1309 05/13/20 SNG    Staphylococcus epidermidis NOT DETECTED NOT DETECTED Final   Staphylococcus  lugdunensis NOT DETECTED NOT DETECTED Final   Streptococcus species NOT DETECTED NOT DETECTED Final   Streptococcus agalactiae NOT DETECTED NOT DETECTED Final   Streptococcus pneumoniae NOT DETECTED NOT DETECTED Final   Streptococcus pyogenes NOT DETECTED NOT DETECTED Final   A.calcoaceticus-baumannii NOT DETECTED NOT DETECTED Final   Bacteroides fragilis NOT DETECTED NOT DETECTED Final   Enterobacterales NOT DETECTED NOT DETECTED Final   Enterobacter cloacae complex NOT DETECTED NOT DETECTED Final   Escherichia coli NOT DETECTED NOT DETECTED Final   Klebsiella aerogenes NOT DETECTED NOT DETECTED Final   Klebsiella oxytoca NOT DETECTED NOT DETECTED  Final   Klebsiella pneumoniae NOT DETECTED NOT DETECTED Final   Proteus species NOT DETECTED NOT DETECTED Final   Salmonella species NOT DETECTED NOT DETECTED Final   Serratia marcescens NOT DETECTED NOT DETECTED Final   Haemophilus influenzae NOT DETECTED NOT DETECTED Final   Neisseria meningitidis NOT DETECTED NOT DETECTED Final   Pseudomonas aeruginosa NOT DETECTED NOT DETECTED Final   Stenotrophomonas maltophilia NOT DETECTED NOT DETECTED Final   Candida albicans NOT DETECTED NOT DETECTED Final   Candida auris NOT DETECTED NOT DETECTED Final   Candida glabrata NOT DETECTED NOT DETECTED Final   Candida krusei NOT DETECTED NOT DETECTED Final   Candida parapsilosis NOT DETECTED NOT DETECTED Final   Candida tropicalis NOT DETECTED NOT DETECTED Final   Cryptococcus neoformans/gattii NOT DETECTED NOT DETECTED Final   Meth resistant mecA/C and MREJ NOT DETECTED NOT DETECTED Final    Comment: Performed at [facility], [address] [place], [place] [address]  Urine culture     Status: None   Collection Time: 05/13/20 12:26 AM   Specimen: In/Out Cath Urine  Result Value Ref Range Status   Specimen Description   Final    IN/OUT CATH URINE Performed at [facility], [address] [place], [place] [address]    Special  Requests   Final    NONE Performed at [facility], [address] [place], [place] [address]    Culture   Final    NO GROWTH Performed at [facility], [place] [address] [place], [place] [address]    Report Status 05/14/2020 FINAL  Final  Resp Panel by RT-PCR (Flu A&B, Covid) Nasopharyngeal Swab     Status: None   Collection Time: 05/13/20 12:26 AM   Specimen: Nasopharyngeal Swab; Nasopharyngeal(NP) swabs in vial transport medium  Result Value Ref Range Status   SARS Coronavirus 2 by RT PCR NEGATIVE NEGATIVE Final    Comment: (NOTE) SARS-CoV-2 target nucleic acids are NOT DETECTED.  The SARS-CoV-2 RNA is generally detectable in upper respiratory specimens during the acute phase of infection. The lowest concentration of SARS-CoV-2 viral copies this assay can detect is 138 copies/mL. A negative result does not preclude SARS-Cov-2 infection and should not be used as the sole basis for treatment or other patient management decisions. A negative result may occur with  improper specimen collection/handling, submission of specimen other than nasopharyngeal swab, presence of viral mutation(s) within the areas targeted by this assay, and inadequate number of viral copies(<138 copies/mL). A negative result must be combined with clinical observations, patient history, and epidemiological information. The expected result is Negative.  Fact Sheet for Patients:  EntrepreneurPulse.com.au  Fact Sheet for Healthcare Providers:  IncredibleEmployment.be  This test is no t yet approved or cleared by the [place] FDA and  has been authorized for detection and/or diagnosis of SARS-CoV-2 by FDA under an Emergency Use Authorization (EUA). This EUA will remain  in effect (meaning this test can be used) for the duration of the COVID-19 declaration under Section 564(b)(1) of the Act, 21 U.S.C.section 360bbb-3(b)(1), unless the authorization is  terminated  or revoked sooner.       Influenza A by PCR NEGATIVE NEGATIVE Final   Influenza B by PCR NEGATIVE NEGATIVE Final    Comment: (NOTE) The Xpert Xpress SARS-CoV-2/FLU/RSV plus assay is intended as an aid in the diagnosis of influenza from Nasopharyngeal swab specimens and should not be used as a sole basis for treatment. Nasal washings and aspirates are unacceptable  for Xpert Xpress SARS-CoV-2/FLU/RSV testing.  Fact Sheet for Patients: EntrepreneurPulse.com.au  Fact Sheet for Healthcare Providers: IncredibleEmployment.be  This test is not yet approved or cleared by the [place] FDA and has been authorized for detection and/or diagnosis of SARS-CoV-2 by FDA under an Emergency Use Authorization (EUA). This EUA will remain in effect (meaning this test can be used) for the duration of the COVID-19 declaration under Section 564(b)(1) of the Act, 21 U.S.C. section 360bbb-3(b)(1), unless the authorization is terminated or revoked.  Performed at [facility], [address] [place], [place] [address]   Culture, blood (Routine X 2) w Reflex to ID Panel     Status: None   Collection Time: 05/14/20  2:41 PM   Specimen: BLOOD  Result Value Ref Range Status   Specimen Description BLOOD RIGHT ANTECUBITAL  Final   Special Requests   Final    BOTTLES DRAWN AEROBIC AND ANAEROBIC Blood Culture adequate volume   Culture   Final    NO GROWTH 5 DAYS Performed at [facility], [address] [place], [place] [address]    Report Status 05/19/2020 FINAL  Final  Culture, blood (Routine X 2) w Reflex to ID Panel     Status: None   Collection Time: 05/14/20  2:55 PM   Specimen: BLOOD  Result Value Ref Range Status   Specimen Description BLOOD BLOOD RIGHT HAND  Final   Special Requests   Final    BOTTLES DRAWN AEROBIC AND ANAEROBIC Blood Culture results may not be optimal due to an excessive volume of blood received in  culture bottles   Culture   Final    NO GROWTH 5 DAYS Performed at [facility], [address], [place], [place] [address]    Report Status 05/19/2020 FINAL  Final  Culture, respiratory (non-expectorated)     Status: None   Collection Time: 05/14/20  5:34 PM   Specimen: Tracheal Aspirate; Respiratory  Result Value Ref Range Status   Specimen Description   Final    TRACHEAL ASPIRATE Performed at [facility], [address], [place], [place] [address]    Special Requests   Final    NONE Performed at [facility], [address] [place], [place] [address]    Gram Stain   Final    MODERATE WBC PRESENT, PREDOMINANTLY PMN FEW GRAM POSITIVE COCCI IN CLUSTERS Performed at [facility], [place] [address] [place], [place] [address]    Culture RARE METHICILLIN RESISTANT STAPHYLOCOCCUS AUREUS  Final   Report Status 05/16/2020 FINAL  Final   Organism ID, Bacteria METHICILLIN RESISTANT STAPHYLOCOCCUS AUREUS  Final      Susceptibility   Methicillin resistant staphylococcus aureus - MIC*    CIPROFLOXACIN >=8 RESISTANT Resistant     ERYTHROMYCIN >=8 RESISTANT Resistant     GENTAMICIN <=0.5 SENSITIVE Sensitive     OXACILLIN >=4 RESISTANT Resistant     TETRACYCLINE <=1 SENSITIVE Sensitive     VANCOMYCIN <=0.5 SENSITIVE Sensitive     TRIMETH/SULFA <=10 SENSITIVE Sensitive     CLINDAMYCIN >=8 RESISTANT Resistant     RIFAMPIN <=0.5 SENSITIVE Sensitive     Inducible Clindamycin NEGATIVE Sensitive     * RARE METHICILLIN RESISTANT STAPHYLOCOCCUS AUREUS  MRSA PCR Screening     Status: Abnormal   Collection Time: 05/14/20  9:04 PM   Specimen: Nasopharyngeal  Result Value Ref Range Status   MRSA by PCR POSITIVE (A) NEGATIVE Final    Comment:  The GeneXpert MRSA Assay (FDA approved for NASAL specimens only), is one component of a comprehensive MRSA colonization surveillance program. It is not intended to diagnose MRSA infection nor to guide or monitor  treatment for MRSA infections. RESULT CALLED TO, READ BACK BY AND VERIFIED WITH: MELISSA COBB AT [phone] 05/15/20.PMF Performed at [facility], [address] [place], [place] [address]   Culture, respiratory     Status: None   Collection Time: 05/22/20 11:26 AM   Specimen: Tracheal Aspirate; Respiratory  Result Value Ref Range Status   Specimen Description   Final    TRACHEAL ASPIRATE Performed at [facility], [address] [place], [place] [address]    Special Requests   Final    NONE Performed at [facility], [address] [place], [place] [address]    Gram Stain   Final    RARE WBC PRESENT, PREDOMINANTLY PMN NO ORGANISMS SEEN    Culture   Final    NO GROWTH 2 DAYS Performed at [facility], [place] [address] [place], [place] [address]    Report Status 05/24/2020 FINAL  Final    Coagulation Studies: No results for input(s): LABPROT, INR in the last 72 hours.  Urinalysis: No results for input(s): COLORURINE, LABSPEC, PHURINE, GLUCOSEU, HGBUR, BILIRUBINUR, KETONESUR, PROTEINUR, UROBILINOGEN, NITRITE, LEUKOCYTESUR in the last 72 hours.  Invalid input(s): APPERANCEUR    Imaging: DG Abd Portable 1V  Result Date: 05/23/2020 CLINICAL DATA:  Ileus. EXAM: PORTABLE ABDOMEN - 1 VIEW COMPARISON:  Abdominal radiograph 05/20/2020. FINDINGS: Enteric tube tip and side-port project over the stomach. Gas is demonstrated within nondilated loops of large and small bowel within the abdomen. Supine evaluation limited for the detection of free intraperitoneal air. IMPRESSION: Nonobstructive bowel gas pattern. Electronically Signed   By: Lovey Newcomer M.D.   On: 05/23/2020 10:33     Medications:   . sodium chloride Stopped (05/20/20 2308)  .  ceFAZolin (ANCEF) IV 2 g (05/24/20 0937)  . dexmedetomidine (PRECEDEX) IV infusion 0.6 mcg/kg/hr (05/24/20 0345)  . feeding supplement (VITAL AF 1.2 CAL) 1,000 mL (05/22/20 1629)   . chlorhexidine gluconate  (MEDLINE KIT)  15 mL Mouth Rinse BID  . Chlorhexidine Gluconate Cloth  6 each Topical Q0600  . feeding supplement (PROSource TF)  90 mL Per Tube TID  . free water  200 mL Per Tube Q4H  . furosemide  40 mg Intravenous Daily  . insulin aspart  0-15 Units Subcutaneous Q4H  . insulin detemir  44 Units Subcutaneous Q12H  . mouth rinse  15 mL Mouth Rinse 10 times per day  . multivitamin with minerals  1 tablet Per Tube Daily  . [START ON 05/25/2020] pantoprazole  40 mg Intravenous Daily  . thiamine injection  100 mg Intravenous Daily   sodium chloride, acetaminophen (TYLENOL) oral liquid 160 mg/5 mL, acetaminophen, dextrose, docusate, fentaNYL (SUBLIMAZE) injection, metoprolol tartrate, polyethylene glycol  Assessment/ Plan:    [name] is a 75 y.o. white male with coronary artery disease, diabetes mellitus type 2, history of AICD placement who was admitted to [facility] on 05/14/2020 for evaluation of altered mental status and DKA  1.  Acute kidney injury, baseline creatinine currently unknown. Renal ultrasound consistent with chronic kidney disease.  2. Proteinuria and glycosuria consistent with diabetic nephropathy.  3.  Hypernatremia. Free water deficit of 2.3 liters 4.  Acute respiratory failure secondary to MRSA pneumonia  5. Sepsis with MSSA  And proteus mirabilis bacteremia.  Plan: No  indication for dialysis.  - remains on furosemide - Continue free water flushes     LOS: 12 Billy Barton 1/24/202210:28 AM

## 2020-05-24 NOTE — Progress Notes (Signed)
PHARMACY CONSULT NOTE  Pharmacy Consult for Electrolyte Monitoring and Replacement   Recent Labs: Potassium (mmol/L)  Date Value  05/24/2020 3.8   Magnesium (mg/dL)  Date Value  27/06/5007 2.5 (H)   Calcium (mg/dL)  Date Value  38/18/2993 8.2 (L)   Albumin (g/dL)  Date Value  71/69/6789 1.6 (L)   Phosphorus (mg/dL)  Date Value  38/01/1750 7.2 (H)   Sodium (mmol/L)  Date Value  05/24/2020 146 (H)   Assessment: 75 year old male admitted with metabolic acidosis s/t DKA vs HHS. Code Blue called after arrival to ICU and patient intubated. Blood cultures positive for MSSA.  Pharmacy to manage electrolytes.  Nutrition: ProSource 90 mL TID + Vital 45 mL/hr Diuretics: furosemide 40 mg IV daily starting 1/22  Goal of Therapy:  Electrolytes WNL  Plan:   Hypernatremia: Improving, continue free water flushes 200 mL q4h (1.2 L/day)  No electrolyte replacement warranted today  Re-check electrolytes in AM  Tressie Ellis 05/24/2020 8:33 AM

## 2020-05-24 NOTE — Progress Notes (Signed)
CRITICAL CARE NOTE 75yo male w/unknown past medical history who presented to the ED w/AMS and was found to be in DKA vs HSS with a blood sugar >600 and lactate 5.8  Patient background:  75yo male w/unknown past medical history who presented to the ED via EMS after the son found him unresponsive, face down in the kitchen, incontinent of urine. Per the ED, the patient son reported to them they call and check on him daily and last spoke with him yesterday. When EMS arrived, the patient was awake but became unresponsive en route to the ED.   On arrival to the ED, the patient was arousable only to painful stimuli, hypothermic 94 otherwise stable vital signs. Labs revealed a blood glucose > 600, lactate 5.8, WBC 26.2, Hgb 7.1 and VBG: 7.19/36 and bicarb 13. He was given 1L LR bolus, started on endotool and antibiotics were initiated. Rapid COVID test negative  ICU was called for admission.   05/14/20- patient with MSSA bacteremia, critially ill on mechanical ventilator. Remains on vasopressor. Insulin gtt is off today. Active GI bleed s/p PRBC transfusion. 05/15/20-patient remains hypotensive on MV. Possible EGD when more stable - spoke with GI- Dr Lacretia NicksW today. Still with edema 2+ on vasopressors.  05/16/20- patient on MV. A line not functioning will dc today. Pt in sinus rhythm remains intubated septic awaiting tee for endocarditis workup.  05/13/2020 - TEE performed at bedside.  05/18/20 - not waking up off sedation, nephrology consulted 05/22/2020 - remains unresponsive off all sedation, secretions from ETT are heavy, will repeat tracheal aspirate. FiO2 is down to 30%, patient intermittetly biting on ETT during examination. He is rhonchorous bilaterally on auscultation. Overnight patient had non bloody vomitus and OGT feeds have been held today. UOP overnight is adequate.  1/23 patient with severe neurological deficit 1/24 increased WOB, on vent   CC  follow up respiratory  failure  SUBJECTIVE Patient remains critically ill Prognosis is guarded Multiorgan failure    BP 119/66   Pulse 85   Temp 99.1 F (37.3 C)   Resp (!) 23   Ht 5\' 8"  (1.727 m)   Wt 106.4 kg   SpO2 99%   BMI 35.67 kg/m    I/O last 3 completed shifts: In: 1146.5 [IV Piggyback:1146.5] Out: 3400 [Urine:3150; Stool:250] Total I/O In: -  Out: 400 [Urine:400]  SpO2: 99 % FiO2 (%): 30 %  Estimated body mass index is 35.67 kg/m as calculated from the following:   Height as of this encounter: 5\' 8"  (1.727 m).   Weight as of this encounter: 106.4 kg.  ETT 1/13>> A line 1/13 - 1/16 CVL 1/13>>   REVIEW OF SYSTEMS  PATIENT IS UNABLE TO PROVIDE COMPLETE REVIEW OF SYSTEMS DUE TO SEVERE CRITICAL ILLNESS        PHYSICAL EXAMINATION:  GENERAL:critically ill appearing, +resp distress HEAD: Normocephalic, atraumatic.  EYES: Pupils equal, round, reactive to light.  No scleral icterus.  MOUTH: Moist mucosal membrane. NECK: Supple.  PULMONARY: +rhonchi, +wheezing CARDIOVASCULAR: S1 and S2. Regular rate and rhythm. No murmurs, rubs, or gallops.  GASTROINTESTINAL: Soft, nontender, -distended.  Positive bowel sounds.   MUSCULOSKELETAL: No swelling, clubbing, or edema.  NEUROLOGIC: obtunded, GCS<8 SKIN:intact,warm,dry  MEDICATIONS: I have reviewed all medications and confirmed regimen as documented   CULTURE RESULTS   Recent Results (from the past 240 hour(s))  Culture, blood (Routine X 2) w Reflex to ID Panel     Status: None   Collection Time: 05/14/20  2:41 PM  Specimen: BLOOD  Result Value Ref Range Status   Specimen Description BLOOD RIGHT ANTECUBITAL  Final   Special Requests   Final    BOTTLES DRAWN AEROBIC AND ANAEROBIC Blood Culture adequate volume   Culture   Final    NO GROWTH 5 DAYS Performed at [facility], [address] [place], [place] [address]    Report Status 05/19/2020 FINAL  Final  Culture, blood (Routine X 2) w Reflex to ID Panel      Status: None   Collection Time: 05/14/20  2:55 PM   Specimen: BLOOD  Result Value Ref Range Status   Specimen Description BLOOD BLOOD RIGHT HAND  Final   Special Requests   Final    BOTTLES DRAWN AEROBIC AND ANAEROBIC Blood Culture results may not be optimal due to an excessive volume of blood received in culture bottles   Culture   Final    NO GROWTH 5 DAYS Performed at [facility], [address], [place], [place] [address]    Report Status 05/19/2020 FINAL  Final  Culture, respiratory (non-expectorated)     Status: None   Collection Time: 05/14/20  5:34 PM   Specimen: Tracheal Aspirate; Respiratory  Result Value Ref Range Status   Specimen Description   Final    TRACHEAL ASPIRATE Performed at [facility], [address], [place], [place] [address]    Special Requests   Final    NONE Performed at [facility], [address] [place], [place] [address]    Gram Stain   Final    MODERATE WBC PRESENT, PREDOMINANTLY PMN FEW GRAM POSITIVE COCCI IN CLUSTERS Performed at [facility], [address], [place], [place] [address]    Culture RARE METHICILLIN RESISTANT STAPHYLOCOCCUS AUREUS  Final   Report Status 05/15/2020 FINAL  Final   Organism ID, Bacteria METHICILLIN RESISTANT STAPHYLOCOCCUS AUREUS  Final      Susceptibility   Methicillin resistant staphylococcus aureus - MIC*    CIPROFLOXACIN >=8 RESISTANT Resistant     ERYTHROMYCIN >=8 RESISTANT Resistant     GENTAMICIN <=0.5 SENSITIVE Sensitive     OXACILLIN >=4 RESISTANT Resistant     TETRACYCLINE <=1 SENSITIVE Sensitive     VANCOMYCIN <=0.5 SENSITIVE Sensitive     TRIMETH/SULFA <=10 SENSITIVE Sensitive     CLINDAMYCIN >=8 RESISTANT Resistant     RIFAMPIN <=0.5 SENSITIVE Sensitive     Inducible Clindamycin NEGATIVE Sensitive     * RARE METHICILLIN RESISTANT STAPHYLOCOCCUS AUREUS  MRSA PCR Screening     Status: Abnormal   Collection Time: 05/14/20  9:04 PM   Specimen:  Nasopharyngeal  Result Value Ref Range Status   MRSA by PCR POSITIVE (A) NEGATIVE Final    Comment:        The GeneXpert MRSA Assay (FDA approved for NASAL specimens only), is one component of a comprehensive MRSA colonization surveillance program. It is not intended to diagnose MRSA infection nor to guide or monitor treatment for MRSA infections. RESULT CALLED TO, READ BACK BY AND VERIFIED WITH: MELISSA COBB AT [phone] 05/15/20.PMF Performed at [facility], [address] [place], [place] [address]   Culture, respiratory     Status: None (Preliminary result)   Collection Time: 05/22/20 11:26 AM   Specimen: Tracheal Aspirate; Respiratory  Result Value Ref Range Status   Specimen Description   Final    TRACHEAL ASPIRATE Performed at [facility], [address] [place], [place] [address]    Special  Requests   Final    NONE Performed at [facility], [address] [place], [place] [address]    Gram Stain   Final    RARE WBC PRESENT, PREDOMINANTLY PMN NO ORGANISMS SEEN    Culture   Final    NO GROWTH < 24 HOURS Performed at [facility], [address], [place], [place] [address]    Report Status PENDING  Incomplete      CBC    Component Value Date/Time   WBC 9.9 05/24/2020 0512   RBC 2.92 (L) 05/24/2020 0512   HGB 8.7 (L) 05/24/2020 0512   HCT 28.1 (L) 05/24/2020 0512   PLT 293 05/24/2020 0512   MCV 96.2 05/24/2020 0512   MCH 29.8 05/24/2020 0512   MCHC 31.0 05/24/2020 0512   RDW 18.0 (H) 05/24/2020 0512   LYMPHSABS 1.2 05/24/2020 0512   MONOABS 0.7 05/24/2020 0512   EOSABS 0.6 (H) 05/24/2020 0512   BASOSABS 0.0 05/24/2020 0512   BMP Latest Ref Rng & Units 05/24/2020 05/23/2020 05/22/2020  Glucose 70 - 99 mg/dL 212(Y) 482(N) 003(B)  BUN 8 - 23 mg/dL 04(U) 88(B) 16(X)  Creatinine 0.61 - 1.24 mg/dL 4.50(T) 8.88(K) 8.00(L)  Sodium 135 - 145 mmol/L 146(H) 147(H) 150(H)  Potassium 3.5 - 5.1 mmol/L 3.8 3.6 3.4(L)  Chloride 98 -  111 mmol/L 109 112(H) 115(H)  CO2 22 - 32 mmol/L 23 23 22   Calcium 8.9 - 10.3 mg/dL 8.2(L) 7.8(L) 7.6(L)      Indwelling Urinary Catheter continued, requirement due to   Reason to continue Indwelling Urinary Catheter strict Intake/Output monitoring for hemodynamic instability   Central Line/ continued, requirement due to  Reason to continue Monitoring of central venous pressure or other hemodynamic parameters and poor IV access   Ventilator continued, requirement due to severe respiratory failure   Ventilator Sedation RASS 0 to -2      ASSESSMENT AND PLAN SYNOPSIS   75 yo white male with acute MRSA pneumonia and MSSA bacteremia PROTEUS BACTERAMIA with severe DKA and acidosis leading to severe and acute hypoxic and hypercapnic resp failure with failure to wean from vent and inability to protect  airway with severe encephalopathy   Severe ACUTE Hypoxic and Hypercapnic Respiratory Failure -continue Full MV support -continue Bronchodilator Therapy -Wean Fio2 and PEEP as tolerated -VAP/VENT bundle implementation  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   Will certainly impact respiratory mechanics, ventilator weaning Suspect will need to consider additional PEEP  ACUTE KIDNEY INJURY/Renal Failure -continue Foley Catheter-assess need -Avoid nephrotoxic agents -Follow urine output, BMP -Ensure adequate renal perfusion, optimize oxygenation -Renal dose medications     NEUROLOGY Acute toxic metabolic encephalopathy Signs of brain damage, can not obtain MRI  CARDIAC ICU monitoring  INFECTIOUS DISEASE -continue antibiotics as prescribed -follow up cultures -follow up ID consultation     GI GI PROPHYLAXIS as indicated   DIET-->TF's as tolerated Constipation protocol as indicated  ENDO - will use ICU hypoglycemic\Hyperglycemia protocol if indicated      ELECTROLYTES -follow labs as needed -replace as needed -pharmacy consultation and following   DVT/GI PRX ordered and assessed TRANSFUSIONS AS NEEDED MONITOR FSBS I Assessed the need for Labs I Assessed the need for Foley I Assessed the need for Central Venous Line Family Discussion when available I Assessed the need for Mobilization I made an Assessment of medications to be adjusted accordingly Safety Risk assessment completed  CASE DISCUSSED IN MULTIDISCIPLINARY ROUNDS WITH ICU TEAM  Critical Care Time devoted to patient care services described in this note is 54  minutes.   Overall, patient is critically ill, prognosis is guarded.  Patient with Multiorgan failure and at high risk for cardiac arrest and death.   Recommend DNR status, GOC conversation today with family   Allanah Mcfarland Santiago Glad, M.D.  [facility]  Medical Director [facility] Medical Director [facility] Cardio-Pulmonary Department

## 2020-05-24 NOTE — Progress Notes (Signed)
GOALS OF CARE DISCUSSION  The Clinical status was relayed to family in detail. Son over the phone  Updated and notified of patients medical condition.  Patient remains unresponsive and will not open eyes to command.    Patient is having a weak cough and struggling to remove secretions.   Patient with increased WOB and using accessory muscles to breathe Explained to family course of therapy and the modalities     Patient with Progressive multiorgan failure with a very high probablity of a very minimal chance of meaningful recovery despite all aggressive and optimal medical therapy. Patient is in the Dying  Process associated with Suffering.  Family understands the situation.  Family are satisfied with Plan of action and management. All questions answered  Additional CC time 32 mins   Billy Barton Santiago Glad, M.D.  [facility]  Medical Director [facility] Medical Director [facility] Cardio-Pulmonary Department

## 2020-05-25 DIAGNOSIS — Z95810 Presence of automatic (implantable) cardiac defibrillator: Secondary | ICD-10-CM | POA: Diagnosis not present

## 2020-05-25 DIAGNOSIS — E0811 Diabetes mellitus due to underlying condition with ketoacidosis with coma: Secondary | ICD-10-CM

## 2020-05-25 DIAGNOSIS — J9601 Acute respiratory failure with hypoxia: Secondary | ICD-10-CM | POA: Diagnosis not present

## 2020-05-25 DIAGNOSIS — J9600 Acute respiratory failure, unspecified whether with hypoxia or hypercapnia: Secondary | ICD-10-CM

## 2020-05-25 LAB — BASIC METABOLIC PANEL
Anion gap: 14 (ref 5–15)
BUN: 97 mg/dL — ABNORMAL HIGH (ref 8–23)
CO2: 22 mmol/L (ref 22–32)
Calcium: 7.9 mg/dL — ABNORMAL LOW (ref 8.9–10.3)
Chloride: 109 mmol/L (ref 98–111)
Creatinine, Ser: 3.16 mg/dL — ABNORMAL HIGH (ref 0.61–1.24)
GFR, Estimated: 20 mL/min — ABNORMAL LOW (ref >60)
Glucose, Bld: 170 mg/dL — ABNORMAL HIGH (ref 70–99)
Potassium: 3.9 mmol/L (ref 3.5–5.1)
Sodium: 145 mmol/L (ref 135–145)

## 2020-05-25 LAB — CBC WITH DIFFERENTIAL/PLATELET
Abs Immature Granulocytes: 0.12 10*3/uL — ABNORMAL HIGH (ref 0.00–0.07)
Basophils Absolute: 0 10*3/uL (ref 0.0–0.1)
Basophils Relative: 0 %
Eosinophils Absolute: 0.5 10*3/uL (ref 0.0–0.5)
Eosinophils Relative: 5 %
HCT: 28.3 % — ABNORMAL LOW (ref 39.0–52.0)
Hemoglobin: 8.7 g/dL — ABNORMAL LOW (ref 13.0–17.0)
Immature Granulocytes: 1 %
Lymphocytes Relative: 10 %
Lymphs Abs: 1.1 10*3/uL (ref 0.7–4.0)
MCH: 29.6 pg (ref 26.0–34.0)
MCHC: 30.7 g/dL (ref 30.0–36.0)
MCV: 96.3 fL (ref 80.0–100.0)
Monocytes Absolute: 0.7 10*3/uL (ref 0.1–1.0)
Monocytes Relative: 6 %
Neutro Abs: 8.4 10*3/uL — ABNORMAL HIGH (ref 1.7–7.7)
Neutrophils Relative %: 78 %
Platelets: 290 10*3/uL (ref 150–400)
RBC: 2.94 MIL/uL — ABNORMAL LOW (ref 4.22–5.81)
RDW: 17.1 % — ABNORMAL HIGH (ref 11.5–15.5)
WBC: 10.9 10*3/uL — ABNORMAL HIGH (ref 4.0–10.5)
nRBC: 0 % (ref 0.0–0.2)

## 2020-05-25 LAB — PHOSPHORUS: Phosphorus: 8.9 mg/dL — ABNORMAL HIGH (ref 2.5–4.6)

## 2020-05-25 LAB — GLUCOSE, CAPILLARY
Glucose-Capillary: 112 mg/dL — ABNORMAL HIGH (ref 70–99)
Glucose-Capillary: 125 mg/dL — ABNORMAL HIGH (ref 70–99)
Glucose-Capillary: 156 mg/dL — ABNORMAL HIGH (ref 70–99)
Glucose-Capillary: 162 mg/dL — ABNORMAL HIGH (ref 70–99)
Glucose-Capillary: 167 mg/dL — ABNORMAL HIGH (ref 70–99)
Glucose-Capillary: 180 mg/dL — ABNORMAL HIGH (ref 70–99)

## 2020-05-25 LAB — MAGNESIUM: Magnesium: 2.4 mg/dL (ref 1.7–2.4)

## 2020-05-25 MED ORDER — MIDAZOLAM HCL 2 MG/2ML IJ SOLN
INTRAMUSCULAR | Status: AC
  Filled 2020-05-25: qty 2

## 2020-05-25 MED ORDER — MIDAZOLAM HCL 2 MG/2ML IJ SOLN
INTRAMUSCULAR | Status: AC
  Administered 2020-05-25: 2 mg
  Filled 2020-05-25: qty 2

## 2020-05-25 MED ORDER — FENTANYL 2500MCG IN NS 250ML (10MCG/ML) PREMIX INFUSION
INTRAVENOUS | Status: AC
  Filled 2020-05-25: qty 250

## 2020-05-25 MED ORDER — VECURONIUM BROMIDE 10 MG IV SOLR
10.0000 mg | Freq: Once | INTRAVENOUS | Status: DC
  Filled 2020-05-25: qty 10

## 2020-05-25 MED ORDER — FENTANYL CITRATE (PF) 100 MCG/2ML IJ SOLN
INTRAMUSCULAR | Status: AC
  Administered 2020-05-25: 100 ug via INTRAVENOUS
  Filled 2020-05-25: qty 2

## 2020-05-25 MED ORDER — FENTANYL CITRATE (PF) 100 MCG/2ML IJ SOLN: 100.0000 ug | Freq: Once | INTRAMUSCULAR | Status: AC

## 2020-05-25 MED ORDER — MIDAZOLAM HCL (PF) 5 MG/ML IJ SOLN
2.0000 mg | INTRAMUSCULAR | Status: DC | PRN
  Administered 2020-05-25: 2 mg via INTRAVENOUS
  Filled 2020-05-25: qty 1

## 2020-05-25 MED ORDER — FENTANYL CITRATE (PF) 100 MCG/2ML IJ SOLN
100.0000 ug | Freq: Once | INTRAMUSCULAR | Status: AC
Start: 2020-05-26 — End: 2020-05-25
  Administered 2020-05-25: 100 ug via INTRAVENOUS

## 2020-05-25 MED ORDER — MIDAZOLAM HCL (PF) 5 MG/ML IJ SOLN
4.0000 mg | Freq: Once | INTRAMUSCULAR | Status: AC
  Administered 2020-05-25: 4 mg via INTRAVENOUS

## 2020-05-25 NOTE — Progress Notes (Signed)
Called to patient's bedside due to SPO2 dropping into the 70s.  Bagged, lavaged and suctioned patient for moderate amount of thick whitish-tan secretions.  Increased patient's FIO2 to 100%to allow patient to recover.

## 2020-05-25 NOTE — Progress Notes (Signed)
Had 2 episodes of desaturation over night, using accessory muscles and working very hard to breath. Gave versed and fentanyl per NP orders, respiratory called, to deeply suction and lavage patient. Once he was sedated and suctioned he recovered well.

## 2020-05-25 NOTE — Consult Note (Addendum)
Patient ID: Billy Barton, male   DOB: 1945/08/10, 75 y.o.   MRN: [id number]  HPI Billy Barton is a 75 y.o. male asked to see in consultation by Dr. Mortimer Barton.  Case discussed with him in detail was admitted 12/3 ago with DKA encephalopathy and found unresponsive.  He did have severe anemia as well as an acute kidney injury with metabolic acidosis and a creatinine of 4.3.  There was found to have [bacteremia.  On the hospital he has required blood transfusion and also had CPR due to PEA.  Does have a history of coronary artery disease and diabetes.  He has been on vasopressors and has improved. Have discussed with the son and they want full support and want to be aggressive medically.  He continues to remain critically ill HPI  Past Medical History:  Diagnosis Date  . AICD (automatic cardioverter/defibrillator) present    a. 03/23/2015 s/p MDT DC AICD - placed @ [place], [place].  Marland Kitchen CAD (coronary artery disease)    a. report of MI - unclear of procedures performed.  . Insulin dependent type 2 diabetes mellitus ([place])     History reviewed. No pertinent surgical history.  History reviewed. No pertinent family history.  Social History Social History   Tobacco Use  . Smoking status: Unknown If Ever Smoked  Substance Use Topics  . Alcohol use: Not Currently    Not on File  Current Facility-Administered Medications  Medication Dose Route Frequency Provider Last Rate Last Admin  . 0.9 %  sodium chloride infusion   Intravenous PRN Billy Barton, Billy Cote, NP   Stopped at 05/20/20 2308  . acetaminophen (TYLENOL) 160 MG/5ML solution 650 mg  650 mg Per Tube Q6H PRN Billy Starr, MD      . acetaminophen (TYLENOL) suppository 650 mg  650 mg Rectal Q4H PRN Billy Lipps, MD   650 mg at 05/15/20 0609  . ceFAZolin (ANCEF) IVPB 2g/100 mL premix  2 g Intravenous Q12H Billy Starr, MD   Stopped at 05/25/20 1150  . chlorhexidine gluconate (MEDLINE KIT) (PERIDEX) 0.12 % solution 15  mL  15 mL Mouth Rinse BID Billy Lipps, MD   15 mL at 05/25/20 0844  . Chlorhexidine Gluconate Cloth 2 % PADS 6 each  6 each Topical Q0600 Billy Glazier, MD   6 each at 05/25/20 0431  . dexmedetomidine (PRECEDEX) 400 MCG/100ML (4 mcg/mL) infusion  0.4-1.2 mcg/kg/hr Intravenous Titrated Billy Starr, MD 32.9 mL/hr at 05/25/20 1610 1.2 mcg/kg/hr at 05/25/20 1610  . dextrose 50 % solution 0-50 mL  0-50 mL Intravenous PRN Billy Dark, MD   50 mL at 05/22/20 0824  . docusate (COLACE) 50 MG/5ML liquid 100 mg  100 mg Per Tube BID PRN Billy Starr, MD   100 mg at 05/18/20 1025  . feeding supplement (PROSource TF) liquid 90 mL  90 mL Per Tube TID Billy Starr, MD   90 mL at 05/25/20 1608  . feeding supplement (VITAL AF 1.2 CAL) liquid 1,000 mL  1,000 mL Per Tube Continuous Billy Starr, MD 45 mL/hr at 05/24/20 1449 1,000 mL at 05/24/20 1449  . fentaNYL (SUBLIMAZE) injection 25-50 mcg  25-50 mcg Intravenous Q2H PRN Billy Bill, NP   50 mcg at 05/25/20 1410  . free water 200 mL  200 mL Per Tube Q4H Billy Barton, [place], MD   200 mL at 05/25/20 1600  . furosemide (LASIX) injection 40 mg  40 mg Intravenous Daily Billy Barton, Fuad,  MD   40 mg at 05/25/20 1100  . insulin aspart (novoLOG) injection 0-15 Units  0-15 Units Subcutaneous Q4H Billy Hong D, NP   3 Units at 05/25/20 1608  . insulin detemir (LEVEMIR) injection 44 Units  44 Units Subcutaneous Q12H Billy Starr, MD   44 Units at 05/25/20 1113  . MEDLINE mouth rinse  15 mL Mouth Rinse 10 times per day Billy Lipps, MD   15 mL at 05/25/20 1609  . metoprolol tartrate (LOPRESSOR) injection 5 mg  5 mg Intravenous Q6H PRN Billy Starr, MD      . multivitamin with minerals tablet 1 tablet  1 tablet Per Tube Daily Billy Starr, MD   1 tablet at 05/25/20 1100  . pantoprazole (PROTONIX) injection 40 mg  40 mg Intravenous Daily Billy Lipps, MD   40 mg at 05/25/20 1059  . polyethylene glycol (MIRALAX / GLYCOLAX)  packet 17 g  17 g Per Tube Daily PRN Billy Starr, MD      . thiamine (B-1) injection 100 mg  100 mg Intravenous Daily Billy Starr, MD   100 mg at 05/25/20 1059  . vecuronium (NORCURON) injection 10 mg  10 mg Intravenous Once Billy Bill, NP         Review of Systems Full ROS  Was unable to obtain due to mentation, pt on the vent   Physical Exam Blood pressure 124/65, pulse 82, temperature 98.1 F (36.7 C), temperature source Axillary, resp. rate (!) 31, height 5' 7.99" (1.727 m), weight 106.4 kg, SpO2 93 %. CONSTITUTIONAL: Obese sedated on vent EYES: Pupils are equal, round,  Sclera are non-icteric. EARS, NOSE, MOUTH AND THROAT: He has ET tube in place and is on a vent. LYMPH NODES:  Lymph nodes in the neck are normal. RESPIRATORY:  Lungs are coarse. There is normal respiratory effort, with equal breath sounds bilaterally,coarse bilatrerally CARDIOVASCULAR: Heart is regular without murmurs, gallops, or rubs. GI: The abdomen is  soft, nontender, and nondistended. There are no palpable masses. There is no hepatosplenomegaly. There are normal bowel sounds in all quadrants. GU: Rectal deferred.   MUSCULOSKELETAL: unable to assess strenght due to mentation SKIN: Turgor is good and there are no pathologic skin lesions or ulcers. NEUROLOGIC: He is sedated and on the ventilator   Data Reviewed  I have personally reviewed the patient's imaging, laboratory findings and medical records.    Assessment/Plan 75 year old male with multiorgan failure including respiratory failure acute kidney injury and significant encephalopathy.  We were asked to see him for gastrostomy tube.  No need for any urgent gastrostomy tube at this time.  We will coordinate with ENT for in-house tracheostomy and gastrostomy tube during this hospitalization.  This is provided that he continues to be stable and does not deteriorate medically. discussed with [name] son in detail.  He will also may require a  tracheostomy at some point in time in the future.  Currently there is not necessarily any contraindication for robotic gastrostomy tube.  I had an extensive discussion with the son regarding his disease process.  This procedure will only help him for placement and for long-term enteral nutrition.  This will not change any of his outcome.  Procedure discussed with patient in detail.  Risks, benefits and possible implications including but not limited to: Bleeding, infection, bowel injury, malfunction of the tube, chronic pain, leak.  They understand and wish to proceed.  We will coordinate with ENT about the timing certainly  this is not a pressing issue at this time.  Discussed in detail with Dr. Mortimer Barton and Dr. Tami Ribas  Time spent with the patient was 60 minutes, with more than 50% of the time spent in face-to-face education, counseling and care coordination.     Caroleen Hamman, MD FACS General Surgeon 05/25/2020, 5:56 PM

## 2020-05-25 NOTE — Progress Notes (Signed)
PHARMACY CONSULT NOTE  Pharmacy Consult for Electrolyte Monitoring and Replacement   Recent Labs: Potassium (mmol/L)  Date Value  05/25/2020 3.9   Magnesium (mg/dL)  Date Value  62/56/3893 2.4   Calcium (mg/dL)  Date Value  73/42/8768 7.9 (L)   Albumin (g/dL)  Date Value  11/57/2620 1.6 (L)   Phosphorus (mg/dL)  Date Value  35/59/7416 8.9 (H)   Sodium (mmol/L)  Date Value  05/25/2020 145   Assessment: 75 year old male admitted with metabolic acidosis s/t DKA vs HHS. Code Blue called after arrival to ICU and patient intubated. Blood cultures positive for MSSA.  Pharmacy to manage electrolytes.  Nutrition: ProSource 90 mL TID + Vital 45 mL/hr Diuretics: furosemide 40 mg IV daily starting 1/22  Goal of Therapy:  Electrolytes WNL  Plan:   Hypernatremia: Improved, continue free water flushes 200 mL q4h (1.2 L/day)  No electrolyte replacement warranted today  Re-check electrolytes in AM  Tressie Ellis 05/25/2020 8:49 AM

## 2020-05-25 NOTE — Progress Notes (Signed)
[facility]  ROUNDING NOTE   Subjective:   01/24 0701 - 01/25 0700 In: 4661.7 [I.V.:108.2; HE/NI:7782; IV Piggyback:213.5] Out: 2850 [Urine:2250; Stool:600]  Creatinine 3.16 (3.23) ( 3.42) (3.67) (4.15) Na 145 (146) (147) (150)  UOP 2270m.   Objective:  Vital signs in last 24 hours:  Temp:  [97.1 F (36.2 C)-100.04 F (37.8 C)] 97.9 F (36.6 C) (01/25 0400) Pulse Rate:  [57-102] 82 (01/25 0600) Resp:  [23-31] 30 (01/25 0600) BP: (100-198)/(49-85) 114/50 (01/25 0600) SpO2:  [79 %-100 %] 100 % (01/25 0800) FiO2 (%):  [30 %-100 %] 60 % (01/25 0800)  Weight change:  Filed Weights   05/21/20 0459 05/22/20 0500 05/23/20 0500  Weight: 108.5 kg 107.8 kg 106.4 kg    Intake/Output: I/O last 3 completed shifts: In: 8340.2 [I.V.:183.3; Other:45; NG/GT:7850; IV Piggyback:261.9] Out: 2850 [Urine:2250; Stool:600]   Intake/Output this shift:  No intake/output data recorded.  Physical Exam: General:  Critically ill  Head:  ETT  Eyes:  Anicteric  Neck:  trachea midline  Lungs:   Pressure Control FiO2 100%  Heart:  regular  Abdomen:   Soft, obese, bowel sounds present  Extremities:  ++ peripheral edema.  Neurologic:  Intubated, sedated  Skin:  No rash  Access:  No dialysis access    Basic Metabolic Panel: Recent Labs  Lab 05/21/20 0456 05/22/20 0438 05/23/20 0443 05/24/20 0512 05/25/20 0536  NA 151* 150* 147* 146* 145  K 3.6 3.4* 3.6 3.8 3.9  CL 114* 115* 112* 109 109  CO2 21* _0 GLUCOSE 210* 103* 191* 159* 170*  BUN 98* 94* 88* 89* 97*  CREATININE 4.15* 3.67* 3.42* 3.23* 3.16*  CALCIUM 8.1* 7.6* 7.8* 8.2* 7.9*  MG 2.5* 2.3 2.3 2.5* 2.4  PHOS 5.2* 5.4* 5.9* 7.2* 8.9*    Liver Function Tests: No results for input(s): AST, ALT, ALKPHOS, BILITOT, PROT, ALBUMIN in the last 168 hours. No results for input(s): LIPASE, AMYLASE in the last 168 hours. Recent Labs  Lab 05/21/20 1207  AMMONIA 17    CBC: Recent Labs  Lab 05/21/20 0456  05/22/20 0438 05/23/20 0443 05/24/20 0512 05/25/20 0536  WBC 12.3* 11.7* 11.4* 9.9 10.9*  NEUTROABS 8.4* 8.2* 8.6* 7.3 8.4*  HGB 9.2* 8.4* 8.5* 8.7* 8.7*  HCT 29.3* 27.3* 28.4* 28.1* 28.3*  MCV 94.2 96.1 96.3 96.2 96.3  PLT 286 270 280 293 290    Cardiac Enzymes: No results for input(s): CKTOTAL, CKMB, CKMBINDEX, TROPONINI in the last 168 hours.  BNP: Invalid input(s): POCBNP  CBG: Recent Labs  Lab 05/24/20 1519 05/24/20 1918 05/24/20 2341 05/25/20 0340 05/25/20 0721  GLUCAP 175* 137* 163* 167* 125*    Microbiology: Results for orders placed or performed during the hospital encounter of 05/05/2020  Blood culture (routine single)     Status: Abnormal   Collection Time: 05/23/2020 10:24 PM   Specimen: BLOOD  Result Value Ref Range Status   Specimen Description   Final    BLOOD LEFT ASSIST CONTROL [facility]formed at [facility] [address], [place] [address]   Special Requests   Final    BOTTLES DRAWN AEROBIC AND ANAEROBIC Blood Culture adequate volume [facility] [address] [place] [address]   Culture  Setup Time   Final    Organism ID to follow GRAM POSITIVE COCCI IN BOTH AEROBIC AND ANAEROBIC BOTTLES CRITICAL RESULT CALLED TO, READ BACK BY AND VERIFIED WITH: MYRA SLAUGHTER AT 1309  ON 05/13/20 SNG GRAM NEGATIVE RODS ANAEROBIC BOTTLE ONLY CRITICAL RESULT CALLED TO, READ BACK BY AND VERIFIED WITH: MORGAN HICKS PHARMD _0  05/14/20 EB Performed at [facility], [place] [address], [place], [place] [address]    Culture STAPHYLOCOCCUS AUREUS PROTEUS MIRABILIS  (A)  Final   Report Status 05/16/2020 FINAL  Final   Organism ID, Bacteria STAPHYLOCOCCUS AUREUS  Final   Organism ID, Bacteria PROTEUS MIRABILIS  Final      Susceptibility   Proteus mirabilis - MIC*    AMPICILLIN <=2 SENSITIVE Sensitive     CEFAZOLIN <=4 SENSITIVE Sensitive     CEFEPIME <=0.12 SENSITIVE Sensitive     CEFTAZIDIME <=1 SENSITIVE  Sensitive     CEFTRIAXONE <=0.25 SENSITIVE Sensitive     CIPROFLOXACIN <=0.25 SENSITIVE Sensitive     GENTAMICIN <=1 SENSITIVE Sensitive     IMIPENEM 8 INTERMEDIATE Intermediate     TRIMETH/SULFA <=20 SENSITIVE Sensitive     AMPICILLIN/SULBACTAM <=2 SENSITIVE Sensitive     PIP/TAZO <=4 SENSITIVE Sensitive     * PROTEUS MIRABILIS   Staphylococcus aureus - MIC*    CIPROFLOXACIN <=0.5 SENSITIVE Sensitive     ERYTHROMYCIN <=0.25 SENSITIVE Sensitive     GENTAMICIN <=0.5 SENSITIVE Sensitive     OXACILLIN 0.5 SENSITIVE Sensitive     TETRACYCLINE <=1 SENSITIVE Sensitive     VANCOMYCIN 1 SENSITIVE Sensitive     TRIMETH/SULFA <=10 SENSITIVE Sensitive     CLINDAMYCIN <=0.25 SENSITIVE Sensitive     RIFAMPIN <=0.5 SENSITIVE Sensitive     Inducible Clindamycin NEGATIVE Sensitive     * STAPHYLOCOCCUS AUREUS  Culture, blood (single)     Status: None   Collection Time: 05/04/2020 10:24 PM   Specimen: BLOOD  Result Value Ref Range Status   Specimen Description BLOOD LEFT HAND  Final   Special Requests   Final    BOTTLES DRAWN AEROBIC AND ANAEROBIC Blood Culture adequate volume   Culture   Final    NO GROWTH 5 DAYS Performed at [facility], [address] [place], [place] [address]    Report Status 05/07/2020 FINAL  Final  Blood Culture ID Panel (Reflexed)     Status: Abnormal   Collection Time: 05/20/2020 10:24 PM  Result Value Ref Range Status   Enterococcus faecalis NOT DETECTED NOT DETECTED Final   Enterococcus Faecium NOT DETECTED NOT DETECTED Final   Listeria monocytogenes NOT DETECTED NOT DETECTED Final   Staphylococcus species DETECTED (A) NOT DETECTED Final    Comment: CRITICAL RESULT CALLED TO, READ BACK BY AND VERIFIED WITH: MYRA SLAUGHTER AT 1309 ON 05/13/20 SNG    Staphylococcus aureus (BCID) DETECTED (A) NOT DETECTED Final    Comment: CRITICAL RESULT CALLED TO, READ BACK BY AND VERIFIED WITH: MYRA SLAUGHTER AT 1309 05/13/20 SNG    Staphylococcus epidermidis NOT  DETECTED NOT DETECTED Final   Staphylococcus lugdunensis NOT DETECTED NOT DETECTED Final   Streptococcus species NOT DETECTED NOT DETECTED Final   Streptococcus agalactiae NOT DETECTED NOT DETECTED Final   Streptococcus pneumoniae NOT DETECTED NOT DETECTED Final   Streptococcus pyogenes NOT DETECTED NOT DETECTED Final   A.calcoaceticus-baumannii NOT DETECTED NOT DETECTED Final   Bacteroides fragilis NOT DETECTED NOT DETECTED Final   Enterobacterales NOT DETECTED NOT DETECTED Final   Enterobacter cloacae complex NOT DETECTED NOT DETECTED Final   Escherichia coli NOT DETECTED NOT DETECTED Final   Klebsiella aerogenes NOT DETECTED NOT DETECTED Final   Klebsiella oxytoca NOT DETECTED NOT DETECTED Final   Klebsiella pneumoniae NOT  DETECTED NOT DETECTED Final   Proteus species NOT DETECTED NOT DETECTED Final   Salmonella species NOT DETECTED NOT DETECTED Final   Serratia marcescens NOT DETECTED NOT DETECTED Final   Haemophilus influenzae NOT DETECTED NOT DETECTED Final   Neisseria meningitidis NOT DETECTED NOT DETECTED Final   Pseudomonas aeruginosa NOT DETECTED NOT DETECTED Final   Stenotrophomonas maltophilia NOT DETECTED NOT DETECTED Final   Candida albicans NOT DETECTED NOT DETECTED Final   Candida auris NOT DETECTED NOT DETECTED Final   Candida glabrata NOT DETECTED NOT DETECTED Final   Candida krusei NOT DETECTED NOT DETECTED Final   Candida parapsilosis NOT DETECTED NOT DETECTED Final   Candida tropicalis NOT DETECTED NOT DETECTED Final   Cryptococcus neoformans/gattii NOT DETECTED NOT DETECTED Final   Meth resistant mecA/C and MREJ NOT DETECTED NOT DETECTED Final    Comment: Performed at [facility], [address] [place], [place] [address]  Urine culture     Status: None   Collection Time: 05/13/20 12:26 AM   Specimen: In/Out Cath Urine  Result Value Ref Range Status   Specimen Description   Final    IN/OUT CATH URINE Performed at [facility], [address], [place], [place] [address]    Special Requests   Final    NONE Performed at [facility], [address], [place],  [address]    Culture   Final    NO GROWTH Performed at [facility], [place] [address] [place],  [address]    Report Status 05/14/2020 FINAL  Final  Resp Panel by RT-PCR (Flu A&B, Covid) Nasopharyngeal Swab     Status: None   Collection Time: 05/13/20 12:26 AM   Specimen: Nasopharyngeal Swab; Nasopharyngeal(NP) swabs in vial transport medium  Result Value Ref Range Status   SARS Coronavirus 2 by RT PCR NEGATIVE NEGATIVE Final    Comment: (NOTE) SARS-CoV-2 target nucleic acids are NOT DETECTED.  The SARS-CoV-2 RNA is generally detectable in upper respiratory specimens during the acute phase of infection. The lowest concentration of SARS-CoV-2 viral copies this assay can detect is 138 copies/mL. A negative result does not preclude SARS-Cov-2 infection and should not be used as the sole basis for treatment or other patient management decisions. A negative result may occur with  improper specimen collection/handling, submission of specimen other than nasopharyngeal swab, presence of viral mutation(s) within the areas targeted by this assay, and inadequate number of viral copies(<138 copies/mL). A negative result must be combined with clinical observations, patient history, and epidemiological information. The expected result is Negative.  Fact Sheet for Patients:  EntrepreneurPulse.com.au  Fact Sheet for Healthcare Providers:  IncredibleEmployment.be  This test is no t yet approved or cleared by the [place] FDA and  has been authorized for detection and/or diagnosis of SARS-CoV-2 by FDA under an Emergency Use Authorization (EUA). This EUA will remain  in effect (meaning this test can be used) for the duration of the COVID-19 declaration under Section 564(b)(1) of the Act,  21 U.S.C.section 360bbb-3(b)(1), unless the authorization is terminated  or revoked sooner.       Influenza A by PCR NEGATIVE NEGATIVE Final   Influenza B by PCR NEGATIVE NEGATIVE Final    Comment: (NOTE) The Xpert Xpress SARS-CoV-2/FLU/RSV plus assay is intended as an aid in the diagnosis of influenza from Nasopharyngeal swab specimens and should not be used as a sole basis for treatment. Nasal washings and aspirates are unacceptable for Xpert Xpress SARS-CoV-2/FLU/RSV testing.  Fact Sheet for Patients: EntrepreneurPulse.com.au  Fact Sheet for Healthcare Providers: IncredibleEmployment.be  This test is not yet approved or cleared by the [place] FDA and has been authorized for detection and/or diagnosis of SARS-CoV-2 by FDA under an Emergency Use Authorization (EUA). This EUA will remain in effect (meaning this test can be used) for the duration of the COVID-19 declaration under Section 564(b)(1) of the Act, 21 U.S.C. section 360bbb-3(b)(1), unless the authorization is terminated or revoked.  Performed at [facility], [address] [place], [place] [address]   Culture, blood (Routine X 2) w Reflex to ID Panel     Status: None   Collection Time: 05/14/20  2:41 PM   Specimen: BLOOD  Result Value Ref Range Status   Specimen Description BLOOD RIGHT ANTECUBITAL  Final   Special Requests   Final    BOTTLES DRAWN AEROBIC AND ANAEROBIC Blood Culture adequate volume   Culture   Final    NO GROWTH 5 DAYS Performed at [facility], [address] [place], [place] [address]    Report Status 05/19/2020 FINAL  Final  Culture, blood (Routine X 2) w Reflex to ID Panel     Status: None   Collection Time: 05/14/20  2:55 PM   Specimen: BLOOD  Result Value Ref Range Status   Specimen Description BLOOD BLOOD RIGHT HAND  Final   Special Requests   Final    BOTTLES DRAWN AEROBIC AND ANAEROBIC Blood Culture results may not be  optimal due to an excessive volume of blood received in culture bottles   Culture   Final    NO GROWTH 5 DAYS Performed at [facility], [address], [place], [place] [address]    Report Status 05/19/2020 FINAL  Final  Culture, respiratory (non-expectorated)     Status: None   Collection Time: 05/14/20  5:34 PM   Specimen: Tracheal Aspirate; Respiratory  Result Value Ref Range Status   Specimen Description   Final    TRACHEAL ASPIRATE Performed at [facility], [address] [place], [place] [address]    Special Requests   Final    NONE Performed at [facility], [address] [place], [place] [address]    Gram Stain   Final    MODERATE WBC PRESENT, PREDOMINANTLY PMN FEW GRAM POSITIVE COCCI IN CLUSTERS Performed at [facility], [place] [address], [place], [place] [address]    Culture RARE METHICILLIN RESISTANT STAPHYLOCOCCUS AUREUS  Final   Report Status 05/19/2020 FINAL  Final   Organism ID, Bacteria METHICILLIN RESISTANT STAPHYLOCOCCUS AUREUS  Final      Susceptibility   Methicillin resistant staphylococcus aureus - MIC*    CIPROFLOXACIN >=8 RESISTANT Resistant     ERYTHROMYCIN >=8 RESISTANT Resistant     GENTAMICIN <=0.5 SENSITIVE Sensitive     OXACILLIN >=4 RESISTANT Resistant     TETRACYCLINE <=1 SENSITIVE Sensitive     VANCOMYCIN <=0.5 SENSITIVE Sensitive     TRIMETH/SULFA <=10 SENSITIVE Sensitive     CLINDAMYCIN >=8 RESISTANT Resistant     RIFAMPIN <=0.5 SENSITIVE Sensitive     Inducible Clindamycin NEGATIVE Sensitive     * RARE METHICILLIN RESISTANT STAPHYLOCOCCUS AUREUS  MRSA PCR Screening     Status: Abnormal   Collection Time: 05/14/20  9:04 PM   Specimen: Nasopharyngeal  Result Value Ref Range Status   MRSA by PCR POSITIVE (A) NEGATIVE Final    Comment:        The GeneXpert  MRSA Assay (FDA approved for NASAL specimens only), is one component of a comprehensive MRSA colonization surveillance program. It is  not intended to diagnose MRSA infection nor to guide or monitor treatment for MRSA infections. RESULT CALLED TO, READ BACK BY AND VERIFIED WITH: MELISSA COBB AT [phone] 05/15/20.PMF Performed at [facility], [address] [place], [place] [address]   Culture, respiratory     Status: None   Collection Time: 05/22/20 11:26 AM   Specimen: Tracheal Aspirate; Respiratory  Result Value Ref Range Status   Specimen Description   Final    TRACHEAL ASPIRATE Performed at [facility], [address] [place], [place] [address]    Special Requests   Final    NONE Performed at [facility], [address] [place], [place] [address]    Gram Stain   Final    RARE WBC PRESENT, PREDOMINANTLY PMN NO ORGANISMS SEEN    Culture   Final    NO GROWTH 2 DAYS Performed at [facility], [address] [place], [place] [address]    Report Status 05/24/2020 FINAL  Final    Coagulation Studies: No results for input(s): LABPROT, INR in the last 72 hours.  Urinalysis: No results for input(s): COLORURINE, LABSPEC, PHURINE, GLUCOSEU, HGBUR, BILIRUBINUR, KETONESUR, PROTEINUR, UROBILINOGEN, NITRITE, LEUKOCYTESUR in the last 72 hours.  Invalid input(s): APPERANCEUR    Imaging: DG Chest Port 1 View  Result Date: 05/24/2020 CLINICAL DATA:  Acute respiratory failure EXAM: PORTABLE CHEST 1 VIEW COMPARISON:  May 22, 2020 FINDINGS: The endotracheal tube terminates above the carina. The left-sided pacemaker/ICD is unchanged in positioning. The right-sided central venous catheter is unchanged in positioning. There is no pneumothorax. The enteric tube extends below the left hemidiaphragm. There is no large pleural effusion. There is improved retrocardiac aeration. There is atelectasis at the lung bases. IMPRESSION: 1. Lines and tubes as above. 2. Improved aeration in the lung bases. Electronically Signed   By: Katherine Mantle M.D.   On: 05/24/2020 21:49   DG Abd Portable  1V  Result Date: 05/23/2020 CLINICAL DATA:  Ileus. EXAM: PORTABLE ABDOMEN - 1 VIEW COMPARISON:  Abdominal radiograph 05/28/2020. FINDINGS: Enteric tube tip and side-port project over the stomach. Gas is demonstrated within nondilated loops of large and small bowel within the abdomen. Supine evaluation limited for the detection of free intraperitoneal air. IMPRESSION: Nonobstructive bowel gas pattern. Electronically Signed   By: Annia Belt M.D.   On: 05/23/2020 10:33     Medications:   . sodium chloride Stopped (05/20/20 2308)  .  ceFAZolin (ANCEF) IV 2 g (05/24/20 2143)  . dexmedetomidine (PRECEDEX) IV infusion 1.2 mcg/kg/hr (05/25/20 0636)  . feeding supplement (VITAL AF 1.2 CAL) 1,000 mL (05/24/20 1449)   . chlorhexidine gluconate (MEDLINE KIT)  15 mL Mouth Rinse BID  . Chlorhexidine Gluconate Cloth  6 each Topical Q0600  . feeding supplement (PROSource TF)  90 mL Per Tube TID  . free water  200 mL Per Tube Q4H  . furosemide  40 mg Intravenous Daily  . insulin aspart  0-15 Units Subcutaneous Q4H  . insulin detemir  44 Units Subcutaneous Q12H  . mouth rinse  15 mL Mouth Rinse 10 times per day  . multivitamin with minerals  1 tablet Per Tube Daily  . pantoprazole  40 mg Intravenous Daily  . thiamine injection  100 mg Intravenous Daily  . vecuronium  10 mg Intravenous Once   sodium chloride, acetaminophen (TYLENOL) oral liquid 160  mg/5 mL, acetaminophen, dextrose, docusate, fentaNYL (SUBLIMAZE) injection, metoprolol tartrate, polyethylene glycol  Assessment/ Plan:    Mr. Edu On is a 75 y.o. white male with coronary artery disease, diabetes mellitus type 2, history of AICD placement who was admitted to [facility] on 05/03/2020 for evaluation of altered mental status and DKA  1.  Acute kidney injury, baseline creatinine currently unknown. Renal ultrasound consistent with chronic kidney disease.  2. Proteinuria and glycosuria consistent with diabetic nephropathy.  3.  Hypernatremia.  Free water deficit of 1.9 liters 4.  Acute respiratory failure secondary to MRSA pneumonia  5. Sepsis with MSSA  And proteus mirabilis bacteremia.  Plan: No indication for dialysis.  - remains on furosemide - Continue free water flushes     LOS: 13 [address] K[address] 1/25/20228:37 AM

## 2020-05-25 NOTE — Progress Notes (Signed)
CRITICAL CARE NOTE 75yo male w/unknown past medical history who presented to the ED w/AMS and was found to be in DKA vs HSS with a blood sugar >600 and lactate 5.8  Patient background:  75yo male w/unknown past medical history who presented to the ED via EMS after the son found him unresponsive, face down in the kitchen, incontinent of urine. Per the ED, the patient son reported to them they call and check on him daily and last spoke with him yesterday. When EMS arrived, the patient was awake but became unresponsive en route to the ED.   On arrival to the ED, the patient was arousable only to painful stimuli, hypothermic 94 otherwise stable vital signs. Labs revealed a blood glucose > 600, lactate 5.8, WBC 26.2, Hgb 7.1 and VBG: 7.19/36 and bicarb 13. He was given 1L LR bolus, started on endotool and antibiotics were initiated. Rapid COVID test negative  ICU was called for admission.   05/14/20- patient with MSSA bacteremia, critially ill on mechanical ventilator. Remains on vasopressor. Insulin gtt is off today. Active GI bleed s/p PRBC transfusion. 05/15/20-patient remains hypotensive on MV. Possible EGD when more stable - spoke with GI- Dr Lacretia Nicks today. Still with edema 2+ on vasopressors.  05/16/20- patient on MV. A line not functioning will dc today. Pt in sinus rhythm remains intubated septic awaiting tee for endocarditis workup.  06/07/20 - TEE performed at bedside.  05/18/20 - not waking up off sedation, nephrology consulted 05/22/2020 - remains unresponsive off all sedation, secretions from ETT are heavy, will repeat tracheal aspirate. FiO2 is down to 30%, patient intermittetly biting on ETT during examination. He is rhonchorous bilaterally on auscultation. Overnight patient had non bloody vomitus and OGT feeds have been held today. UOP overnight is adequate. 1/23patient with severe neurological deficit 1/24 increased WOB, on vent 1/25 severe resp distress, near cardiac  arrest   CC  follow up respiratory failure  SUBJECTIVE Patient remains critically ill Prognosis is guarded  Vent Mode: PCV FiO2 (%):  [30 %-100 %] 100 % Set Rate:  [16 bmp] 16 bmp PEEP:  [5 cmH20-8 cmH20] 8 cmH20 CBC    Component Value Date/Time   WBC 10.9 (H) 05/25/2020 0536   RBC 2.94 (L) 05/25/2020 0536   HGB 8.7 (L) 05/25/2020 0536   HCT 28.3 (L) 05/25/2020 0536   PLT 290 05/25/2020 0536   MCV 96.3 05/25/2020 0536   MCH 29.6 05/25/2020 0536   MCHC 30.7 05/25/2020 0536   RDW 17.1 (H) 05/25/2020 0536   LYMPHSABS 1.1 05/25/2020 0536   MONOABS 0.7 05/25/2020 0536   EOSABS 0.5 05/25/2020 0536   BASOSABS 0.0 05/25/2020 0536   BMP Latest Ref Rng & Units 05/25/2020 05/24/2020 05/23/2020  Glucose 70 - 99 mg/dL 431(V) 400(Q) 676(P)  BUN 8 - 23 mg/dL 95(K) 93(O) 67(T)  Creatinine 0.61 - 1.24 mg/dL 2.45(Y) 0.99(I) 3.38(S)  Sodium 135 - 145 mmol/L 145 146(H) 147(H)  Potassium 3.5 - 5.1 mmol/L 3.9 3.8 3.6  Chloride 98 - 111 mmol/L 109 109 112(H)  CO2 22 - 32 mmol/L 22 23 23   Calcium 8.9 - 10.3 mg/dL 7.9(L) 8.2(L) 7.8(L)      BP (!) 114/50   Pulse 82   Temp 97.9 F (36.6 C)   Resp (!) 30   Ht 5' 7.99" (1.727 m)   Wt 106.4 kg   SpO2 100%   BMI 35.67 kg/m    I/O last 3 completed shifts: In: 8340.2 [I.V.:183.3; Other:45; NG/GT:7850; IV Piggyback:261.9] Out: 2850 [  Urine:2250; Stool:600] No intake/output data recorded.  SpO2: 100 % FiO2 (%): (S) 100 %  Estimated body mass index is 35.67 kg/m as calculated from the following:   Height as of this encounter: 5' 7.99" (1.727 m).   Weight as of this encounter: 106.4 kg.  SIGNIFICANT EVENTS   REVIEW OF SYSTEMS  PATIENT IS UNABLE TO PROVIDE COMPLETE REVIEW OF SYSTEMS DUE TO SEVERE CRITICAL ILLNESS        PHYSICAL EXAMINATION:  GENERAL:critically ill appearing, +resp distress HEAD: Normocephalic, atraumatic.  EYES: Pupils equal, round, reactive to light.  No scleral icterus.  MOUTH: Moist mucosal  membrane. NECK: Supple.  PULMONARY: +rhonchi, +wheezing CARDIOVASCULAR: S1 and S2. Regular rate and rhythm. No murmurs, rubs, or gallops.  GASTROINTESTINAL: Soft, nontender, -distended.  Positive bowel sounds.   MUSCULOSKELETAL: No swelling, clubbing, or edema.  NEUROLOGIC: obtunded, GCS<8 SKIN:intact,warm,dry  MEDICATIONS: I have reviewed all medications and confirmed regimen as documented   CULTURE RESULTS   Recent Results (from the past 240 hour(s))  Culture, respiratory     Status: None   Collection Time: 05/22/20 11:26 AM   Specimen: Tracheal Aspirate; Respiratory  Result Value Ref Range Status   Specimen Description   Final    TRACHEAL ASPIRATE Performed at [facility], [address] [place], [place] [address]    Special Requests   Final    NONE Performed at [facility], [address] [place], [place] [address]    Gram Stain   Final    RARE WBC PRESENT, PREDOMINANTLY PMN NO ORGANISMS SEEN    Culture   Final    NO GROWTH 2 DAYS Performed at [facility], [address] [place], [place] [address]    Report Status 05/24/2020 FINAL  Final          IMAGING    DG Chest Port 1 View  Result Date: 05/24/2020 CLINICAL DATA:  Acute respiratory failure EXAM: PORTABLE CHEST 1 VIEW COMPARISON:  May 22, 2020 FINDINGS: The endotracheal tube terminates above the carina. The left-sided pacemaker/ICD is unchanged in positioning. The right-sided central venous catheter is unchanged in positioning. There is no pneumothorax. The enteric tube extends below the left hemidiaphragm. There is no large pleural effusion. There is improved retrocardiac aeration. There is atelectasis at the lung bases. IMPRESSION: 1. Lines and tubes as above. 2. Improved aeration in the lung bases. Electronically Signed   By: Katherine Mantle M.D.   On: 05/24/2020 21:49     Nutrition Status: Nutrition Problem: Inadequate oral intake Etiology: inability to  eat Signs/Symptoms: NPO status Interventions: Prostat,Tube feeding,MVI     Indwelling Urinary Catheter continued, requirement due to   Reason to continue Indwelling Urinary Catheter strict Intake/Output monitoring for hemodynamic instability   Central Line/ continued, requirement due to  Reason to continue [employer] Monitoring of central venous pressure or other hemodynamic parameters and poor IV access   Ventilator continued, requirement due to severe respiratory failure   Ventilator Sedation RASS 0 to -2      ASSESSMENT AND PLAN SYNOPSIS   75 yo white male with acute MRSA pneumonia and MSSA bacteremia PROTEUS BACTERAMIA with severe DKA and acidosis leading to severe and acute hypoxic and hypercapnic resp failure with failure to wean from vent and inability to protect airway with severe encephalopathy   Severe ACUTE Hypoxic and Hypercapnic Respiratory Failure -continue Full MV support -continue Bronchodilator Therapy -Wean Fio2 and PEEP as tolerated -VAP/VENT bundle implementation  ACUTE SYSTOLIC CARDIAC FAILURE-  -  oxygen as needed -Lasix as tolerated  Morbid obesity, possible OSA.   Will certainly impact respiratory mechanics, ventilator weaning Suspect will need to consider additional PEEP   ACUTE KIDNEY INJURY/Renal Failure -continue Foley Catheter-assess need -Avoid nephrotoxic agents -Follow urine output, BMP -Ensure adequate renal perfusion, optimize oxygenation -Renal dose medications     NEUROLOGY Acute toxic metabolic encephalopathy, need for sedation Goal RASS -2 to -3   CARDIAC ICU monitoring  INFECTIOUS DISEASE -continue antibiotics as prescribed -follow up cultures -follow up ID consultation    GI GI PROPHYLAXIS as indicated   DIET-->TF's as tolerated Constipation protocol as indicated  ENDO - will use ICU hypoglycemic\Hyperglycemia protocol if indicated     ELECTROLYTES -follow labs as needed -replace as  needed -pharmacy consultation and following   DVT/GI PRX ordered and assessed TRANSFUSIONS AS NEEDED MONITOR FSBS I Assessed the need for Labs I Assessed the need for Foley I Assessed the need for Central Venous Line Family Discussion when available I Assessed the need for Mobilization I made an Assessment of medications to be adjusted accordingly Safety Risk assessment completed   CASE DISCUSSED IN MULTIDISCIPLINARY ROUNDS WITH ICU TEAM  Critical Care Time devoted to patient care services described in this note is 49 minutes.   Overall, patient is critically ill, prognosis is guarded.  Patient with Multiorgan failure and at high risk for cardiac arrest and death.   Prognosis is very poor, patient will need artifical support to stay alive  Lucie Leather, M.D.  [facility]  Medical Director [facility] Medical Director [facility] Cardio-Pulmonary Department

## 2020-05-26 ENCOUNTER — Inpatient Hospital Stay: Payer: No Typology Code available for payment source

## 2020-05-26 DIAGNOSIS — U071 COVID-19: Secondary | ICD-10-CM

## 2020-05-26 DIAGNOSIS — R7881 Bacteremia: Secondary | ICD-10-CM | POA: Diagnosis not present

## 2020-05-26 DIAGNOSIS — D72829 Elevated white blood cell count, unspecified: Secondary | ICD-10-CM

## 2020-05-26 DIAGNOSIS — Z95810 Presence of automatic (implantable) cardiac defibrillator: Secondary | ICD-10-CM | POA: Diagnosis not present

## 2020-05-26 DIAGNOSIS — J9601 Acute respiratory failure with hypoxia: Secondary | ICD-10-CM | POA: Diagnosis not present

## 2020-05-26 DIAGNOSIS — B9561 Methicillin susceptible Staphylococcus aureus infection as the cause of diseases classified elsewhere: Secondary | ICD-10-CM | POA: Diagnosis not present

## 2020-05-26 DIAGNOSIS — J15212 Pneumonia due to Methicillin resistant Staphylococcus aureus: Secondary | ICD-10-CM | POA: Diagnosis not present

## 2020-05-26 DIAGNOSIS — I483 Typical atrial flutter: Secondary | ICD-10-CM | POA: Diagnosis not present

## 2020-05-26 DIAGNOSIS — E1111 Type 2 diabetes mellitus with ketoacidosis with coma: Secondary | ICD-10-CM | POA: Diagnosis not present

## 2020-05-26 LAB — GLUCOSE, CAPILLARY
Glucose-Capillary: 251 mg/dL — ABNORMAL HIGH (ref 70–99)
Glucose-Capillary: 273 mg/dL — ABNORMAL HIGH (ref 70–99)
Glucose-Capillary: 292 mg/dL — ABNORMAL HIGH (ref 70–99)
Glucose-Capillary: 280 mg/dL — ABNORMAL HIGH (ref 70–99)
Glucose-Capillary: 253 mg/dL — ABNORMAL HIGH (ref 70–99)
Glucose-Capillary: 235 mg/dL — ABNORMAL HIGH (ref 70–99)

## 2020-05-26 LAB — BASIC METABOLIC PANEL
Anion gap: 15 (ref 5–15)
BUN: 108 mg/dL — ABNORMAL HIGH (ref 8–23)
CO2: 20 mmol/L — ABNORMAL LOW (ref 22–32)
Calcium: 8 mg/dL — ABNORMAL LOW (ref 8.9–10.3)
Chloride: 109 mmol/L (ref 98–111)
Creatinine, Ser: 3.17 mg/dL — ABNORMAL HIGH (ref 0.61–1.24)
GFR, Estimated: 20 mL/min — ABNORMAL LOW (ref >60)
Glucose, Bld: 311 mg/dL — ABNORMAL HIGH (ref 70–99)
Potassium: 4.8 mmol/L (ref 3.5–5.1)
Sodium: 144 mmol/L (ref 135–145)

## 2020-05-26 LAB — CBC WITH DIFFERENTIAL/PLATELET
Abs Immature Granulocytes: 0.22 10*3/uL — ABNORMAL HIGH (ref 0.00–0.07)
Basophils Absolute: 0 10*3/uL (ref 0.0–0.1)
Basophils Relative: 0 %
Eosinophils Absolute: 0 10*3/uL (ref 0.0–0.5)
Eosinophils Relative: 0 %
HCT: 29.3 % — ABNORMAL LOW (ref 39.0–52.0)
Hemoglobin: 8.7 g/dL — ABNORMAL LOW (ref 13.0–17.0)
Immature Granulocytes: 2 %
Lymphocytes Relative: 3 %
Lymphs Abs: 0.4 10*3/uL — ABNORMAL LOW (ref 0.7–4.0)
MCH: 29.6 pg (ref 26.0–34.0)
MCHC: 29.7 g/dL — ABNORMAL LOW (ref 30.0–36.0)
MCV: 99.7 fL (ref 80.0–100.0)
Monocytes Absolute: 0.2 10*3/uL (ref 0.1–1.0)
Monocytes Relative: 2 %
Neutro Abs: 13.1 10*3/uL — ABNORMAL HIGH (ref 1.7–7.7)
Neutrophils Relative %: 93 %
Platelets: 303 10*3/uL (ref 150–400)
RBC: 2.94 MIL/uL — ABNORMAL LOW (ref 4.22–5.81)
RDW: 17 % — ABNORMAL HIGH (ref 11.5–15.5)
WBC: 14.1 10*3/uL — ABNORMAL HIGH (ref 4.0–10.5)
nRBC: 0 % (ref 0.0–0.2)

## 2020-05-26 LAB — MAGNESIUM: Magnesium: 2.5 mg/dL — ABNORMAL HIGH (ref 1.7–2.4)

## 2020-05-26 LAB — PHOSPHORUS: Phosphorus: 11.7 mg/dL — ABNORMAL HIGH (ref 2.5–4.6)

## 2020-05-26 MED ORDER — IPRATROPIUM-ALBUTEROL 0.5-2.5 (3) MG/3ML IN SOLN
3.0000 mL | Freq: Four times a day (QID) | RESPIRATORY_TRACT | Status: DC | PRN
  Administered 2020-05-26 (×3): 3 mL via RESPIRATORY_TRACT
  Filled 2020-05-26 (×2): qty 3

## 2020-05-26 MED ORDER — METHYLPREDNISOLONE SODIUM SUCC 40 MG IJ SOLR: 40.0000 mg | Freq: Two times a day (BID) | INTRAMUSCULAR | Status: DC

## 2020-05-26 MED ORDER — IPRATROPIUM-ALBUTEROL 0.5-2.5 (3) MG/3ML IN SOLN
RESPIRATORY_TRACT | Status: AC
  Filled 2020-05-26: qty 3

## 2020-05-26 MED ORDER — FENTANYL 2500MCG IN NS 250ML (10MCG/ML) PREMIX INFUSION
INTRAVENOUS | Status: AC
  Filled 2020-05-26: qty 250

## 2020-05-26 MED ORDER — VECURONIUM BROMIDE 10 MG IV SOLR
10.0000 mg | Freq: Once | INTRAVENOUS | Status: AC
  Administered 2020-05-26: 10 mg via INTRAVENOUS

## 2020-05-26 MED ORDER — METHYLPREDNISOLONE SODIUM SUCC 40 MG IJ SOLR
20.0000 mg | Freq: Two times a day (BID) | INTRAMUSCULAR | Status: DC
  Administered 2020-05-26 – 2020-05-29 (×6): 20 mg via INTRAVENOUS
  Filled 2020-05-26 (×6): qty 1

## 2020-05-26 MED ORDER — VECURONIUM BROMIDE 10 MG IV SOLR: 10.0000 mg | INTRAVENOUS | Status: DC | PRN

## 2020-05-26 MED ORDER — FENTANYL 2500MCG IN NS 250ML (10MCG/ML) PREMIX INFUSION
0.0000 ug/h | INTRAVENOUS | Status: DC
  Administered 2020-05-26: 25 ug/h via INTRAVENOUS
  Administered 2020-05-26: 150 ug/h via INTRAVENOUS
  Administered 2020-05-27: 175 ug/h via INTRAVENOUS
  Filled 2020-05-26 (×2): qty 250

## 2020-05-26 MED ORDER — INSULIN ASPART 100 UNIT/ML ~~LOC~~ SOLN
4.0000 [IU] | SUBCUTANEOUS | Status: DC
  Administered 2020-05-26 – 2020-05-28 (×10): 4 [IU] via SUBCUTANEOUS
  Filled 2020-05-26 (×10): qty 1

## 2020-05-26 MED ORDER — METHYLPREDNISOLONE SODIUM SUCC 125 MG IJ SOLR
INTRAMUSCULAR | Status: AC
  Filled 2020-05-26: qty 2

## 2020-05-26 MED ORDER — VECURONIUM BROMIDE 10 MG IV SOLR
INTRAVENOUS | Status: AC
  Administered 2020-05-26: 10 mg via INTRAVENOUS
  Filled 2020-05-26: qty 10

## 2020-05-26 MED ORDER — MIDAZOLAM HCL 2 MG/2ML IJ SOLN
INTRAMUSCULAR | Status: AC
  Administered 2020-05-26: 2 mg via INTRAVENOUS
  Filled 2020-05-26: qty 2

## 2020-05-26 MED ORDER — METHYLPREDNISOLONE SODIUM SUCC 125 MG IJ SOLR
60.0000 mg | INTRAMUSCULAR | Status: AC
  Administered 2020-05-26: 60 mg via INTRAVENOUS

## 2020-05-26 MED ORDER — MIDAZOLAM HCL 2 MG/2ML IJ SOLN
2.0000 mg | INTRAMUSCULAR | Status: DC | PRN
  Administered 2020-05-29 – 2020-05-30 (×6): 2 mg via INTRAVENOUS
  Filled 2020-05-26 (×7): qty 2

## 2020-05-26 MED ORDER — MIDAZOLAM HCL (PF) 5 MG/ML IJ SOLN
2.0000 mg | INTRAMUSCULAR | Status: DC | PRN
  Filled 2020-05-26: qty 1

## 2020-05-26 MED ORDER — MIDAZOLAM HCL 2 MG/2ML IJ SOLN
INTRAMUSCULAR | Status: AC
  Filled 2020-05-26: qty 2

## 2020-05-26 NOTE — Progress Notes (Signed)
CRITICAL CARE NOTE  75yo male w/unknown past medical history who presented to the ED w/AMS and was found to be in DKA vs HSS with a blood sugar >600 and lactate 5.8  Patient background:  75yo male w/unknown past medical history who presented to the ED via EMS after the son found him unresponsive, face down in the kitchen, incontinent of urine. Per the ED, the patient son reported to them they call and check on him daily and last spoke with him yesterday. When EMS arrived, the patient was awake but became unresponsive en route to the ED.   On arrival to the ED, the patient was arousable only to painful stimuli, hypothermic 94 otherwise stable vital signs. Labs revealed a blood glucose > 600, lactate 5.8, WBC 26.2, Hgb 7.1 and VBG: 7.19/36 and bicarb 13. He was given 1L LR bolus, started on endotool and antibiotics were initiated. Rapid COVID test negative  ICU was called for admission.   05/14/20- patient with MSSA bacteremia, critially ill on mechanical ventilator. Remains on vasopressor. Insulin gtt is off today. Active GI bleed s/p PRBC transfusion. 05/15/20-patient remains hypotensive on MV. Possible EGD when more stable - spoke with GI- Dr Lacretia Nicks today. Still with edema 2+ on vasopressors.  05/16/20- patient on MV. A line not functioning will dc today. Pt in sinus rhythm remains intubated septic awaiting tee for endocarditis workup.  05/18/2020 - TEE performed at bedside.  05/18/20 - not waking up off sedation, nephrology consulted 05/22/2020 - remains unresponsive off all sedation, secretions from ETT are heavy, will repeat tracheal aspirate. FiO2 is down to 30%, patient intermittetly biting on ETT during examination. He is rhonchorous bilaterally on auscultation. Overnight patient had non bloody vomitus and OGT feeds have been held today. UOP overnight is adequate. 1/23patient with severe neurological deficit 1/24increased WOB, on vent 1/25 severe resp distress, near cardiac  arrest 1/26 severe resp failure       CC  follow up respiratory failure  SUBJECTIVE Patient remains critically ill Prognosis is guarded multiorgan failure   Vent Mode: PCV FiO2 (%):  [50 %-60 %] 50 % Set Rate:  [16 bmp] 16 bmp PEEP:  [8 cmH20] 8 cmH20 Plateau Pressure:  [0.26 cmH20] 0.26 cmH20  CBC    Component Value Date/Time   WBC 14.1 (H) 05/26/2020 0446   RBC 2.94 (L) 05/26/2020 0446   HGB 8.7 (L) 05/26/2020 0446   HCT 29.3 (L) 05/26/2020 0446   PLT 303 05/26/2020 0446   MCV 99.7 05/26/2020 0446   MCH 29.6 05/26/2020 0446   MCHC 29.7 (L) 05/26/2020 0446   RDW 17.0 (H) 05/26/2020 0446   LYMPHSABS 0.4 (L) 05/26/2020 0446   MONOABS 0.2 05/26/2020 0446   EOSABS 0.0 05/26/2020 0446   BASOSABS 0.0 05/26/2020 0446   BMP Latest Ref Rng & Units 05/26/2020 05/25/2020 05/24/2020  Glucose 70 - 99 mg/dL 616(W) 737(T) 062(I)  BUN 8 - 23 mg/dL 948(N) 46(E) 70(J)  Creatinine 0.61 - 1.24 mg/dL 5.00(X) 3.81(W) 2.99(B)  Sodium 135 - 145 mmol/L 144 145 146(H)  Potassium 3.5 - 5.1 mmol/L 4.8 3.9 3.8  Chloride 98 - 111 mmol/L 109 109 109  CO2 22 - 32 mmol/L 20(L) 22 23  Calcium 8.9 - 10.3 mg/dL 8.0(L) 7.9(L) 8.2(L)    BP (!) 142/73   Pulse 85   Temp 98.3 F (36.8 C) (Oral)   Resp 16   Ht 5' 7.99" (1.727 m)   Wt 106.8 kg   SpO2 95%   BMI 35.81  kg/m    I/O last 3 completed shifts: In: 2020.9 [I.V.:1180.3; Other:45; NG/GT:495; IV Piggyback:300.6] Out: 3750 [Urine:2700; Stool:1050] No intake/output data recorded.  SpO2: 95 % FiO2 (%): 50 %  Estimated body mass index is 35.81 kg/m as calculated from the following:   Height as of this encounter: 5' 7.99" (1.727 m).   Weight as of this encounter: 106.8 kg.   REVIEW OF SYSTEMS  PATIENT IS UNABLE TO PROVIDE COMPLETE REVIEW OF SYSTEMS DUE TO SEVERE CRITICAL ILLNESS        PHYSICAL EXAMINATION:  GENERAL:critically ill appearing, +resp distress HEAD: Normocephalic, atraumatic.  EYES: Pupils equal, round,  reactive to light.  No scleral icterus.  MOUTH: Moist mucosal membrane. NECK: Supple.  PULMONARY: +rhonchi, +wheezing CARDIOVASCULAR: S1 and S2. Regular rate and rhythm. No murmurs, rubs, or gallops.  GASTROINTESTINAL: Soft, nontender, -distended.  Positive bowel sounds.   MUSCULOSKELETAL: No swelling, clubbing, or edema.  NEUROLOGIC: obtunded, GCS<8 SKIN:intact,warm,dry  MEDICATIONS: I have reviewed all medications and confirmed regimen as documented   CULTURE RESULTS   Recent Results (from the past 240 hour(s))  Culture, respiratory     Status: None   Collection Time: 05/22/20 11:26 AM   Specimen: Tracheal Aspirate; Respiratory  Result Value Ref Range Status   Specimen Description   Final    TRACHEAL ASPIRATE Performed at [facility], [address] [place], [place] [address]    Special Requests   Final    NONE Performed at [facility], [address] [place], [place] [address]    Gram Stain   Final    RARE WBC PRESENT, PREDOMINANTLY PMN NO ORGANISMS SEEN    Culture   Final    NO GROWTH 2 DAYS Performed at [facility], [address] [place], [place] [address]    Report Status 05/24/2020 FINAL  Final          Indwelling Urinary Catheter continued, requirement due to   Reason to continue Indwelling Urinary Catheter strict Intake/Output monitoring for hemodynamic instability   Central Line/ continued, requirement due to  Reason to continue [employer] Monitoring of central venous pressure or other hemodynamic parameters and poor IV access   Ventilator continued, requirement due to severe respiratory failure   Ventilator Sedation RASS 0 to -2      ASSESSMENT AND PLAN SYNOPSIS   75 yo white male with acute MRSA pneumonia and MSSA bacteremia PROTEUS BACTERAMIAwith severe DKA and acidosis leading to severe and acute hypoxic and hypercapnic resp failure with failure to wean from vent and inability to protect airway with severe  encephalopathy failure to wean from vent   Severe ACUTE Hypoxic and Hypercapnic Respiratory Failure -continue Full MV support -continue Bronchodilator Therapy -Wean Fio2 and PEEP as tolerated -VAP/VENT bundle implementation Will need TRACH and PEG tube to survive  ACUTE DIASTOLIC CARDIAC FAILURE-  -oxygen as needed -Lasix as tolerated   Morbid obesity, possible OSA.   Will certainly impact respiratory mechanics, ventilator weaning Suspect will need to consider additional PEEP  ACUTE KIDNEY INJURY/Renal Failure -continue Foley Catheter-assess need -Avoid nephrotoxic agents -Follow urine output, BMP -Ensure adequate renal perfusion, optimize oxygenation -Renal dose medications     NEUROLOGY Acute toxic metabolic encephalopathy, need for sedation Goal RASS -2 to -3   CARDIAC ICU monitoring  INFECTIOUS DISEASE -continue antibiotics as prescribed -follow up cultures -follow up ID consultation   GI GI PROPHYLAXIS as indicated   DIET-->TF's as tolerated Constipation protocol as indicated  ENDO - will use  ICU hypoglycemic\Hyperglycemia protocol if indicated     ELECTROLYTES -follow labs as needed -replace as needed -pharmacy consultation and following   DVT/GI PRX ordered and assessed TRANSFUSIONS AS NEEDED MONITOR FSBS I Assessed the need for Labs I Assessed the need for Foley I Assessed the need for Central Venous Line Family Discussion when available I Assessed the need for Mobilization I made an Assessment of medications to be adjusted accordingly Safety Risk assessment completed   CASE DISCUSSED IN MULTIDISCIPLINARY ROUNDS WITH ICU TEAM  Critical Care Time devoted to patient care services described in this note is 45 minutes.   Overall, patient is critically ill, prognosis is guarded.  Patient with Multiorgan failure and at high risk for cardiac arrest and death.   Very poor chance of meaningful recovery  Lucie Leather, M.D.  [facility]  [facility]  Medical Director [facility] Medical Director [facility] Cardio-Pulmonary Department

## 2020-05-26 NOTE — Consult Note (Signed)
Billy Barton, [name] [id number] 01-Sep-1945 [name], MD  Reason for Consult: Failure to extubate  HPI: Chart reviewed, patient with multiple medical issues, intubated for pneumonia now failure to extubate.  Will plan to proceed with tracheostomy.  Allergies: Not on File  ROS: Review of systems normal other than 12 systems except per HPI.  PMH:  Past Medical History:  Diagnosis Date  . AICD (automatic cardioverter/defibrillator) present    a. 03/23/2015 s/p MDT DC AICD - placed @ [facility] [place], [place].  Marland Kitchen CAD (coronary artery disease)    a. report of MI - unclear of procedures performed.  . Insulin dependent type 2 diabetes mellitus (HCC)     FH: History reviewed. No pertinent family history.  SH:  Social History   Socioeconomic History  . Marital status: Widowed    Spouse name: Not on file  . Number of children: Not on file  . Years of education: Not on file  . Highest education level: Not on file  Occupational History  . Not on file  Tobacco Use  . Smoking status: Unknown If Ever Smoked  . Smokeless tobacco: Not on file  Substance and Sexual Activity  . Alcohol use: Not Currently  . Drug use: Not on file  . Sexual activity: Not on file  Other Topics Concern  . Not on file  Social History Narrative   Was living in [place] - homeless.  Son moved him up to [place] ~ 2-3 yrs ago.  Pt lives by himself but son nearby and w/ close/frequent contact.   Social Determinants of Health   Financial Resource Strain: Not on file  Food Insecurity: Not on file  Transportation Needs: Not on file  Physical Activity: Not on file  Stress: Not on file  Social Connections: Not on file  Intimate Partner Violence: Not on file    PSH: History reviewed. No pertinent surgical history.  Physical  Exam: intubated and sedated.  No previous neck scar noted.  Moderate obesity.     A/P: Failure to extubate, will proceed with tracheostomy.  I will reach out to his son to  discuss consent, then let ICU staff know of planned surgery.  ICU time 40 mins.     Davina Poke 05/26/2020 7:48 AM

## 2020-05-26 NOTE — Progress Notes (Signed)
ID Date of admission 05/21/2020.   LDA Triple-lumen placed on 05/13/2020 right internal jugular Urethral catheter placed on 05/30/2020. OG tube placed on 2020-05-23. Endotracheal tube placed on 05/13/2020  Patient Vitals for the past 24 hrs:  BP Temp Temp src Pulse Resp SpO2 Weight  05/26/20 1200 (!) 142/86 -- -- 70 14 98 % --  05/26/20 1100 136/81 -- -- 75 13 94 % --  05/26/20 1000 137/73 -- -- 60 14 97 % --  05/26/20 0900 (!) 146/84 -- -- 75 15 93 % --  05/26/20 0830 -- -- -- 79 15 91 % --  05/26/20 0800 (!) 152/86 98.4 F (36.9 C) Oral 78 15 92 % --  05/26/20 0725 -- -- -- -- -- 96 % --  05/26/20 0630 -- -- -- 80 18 94 % --  05/26/20 0600 (!) 151/85 -- -- 77 17 96 % --  05/26/20 0530 -- -- -- 81 17 95 % --  05/26/20 0500 (!) 142/73 -- -- 85 16 95 % --  05/26/20 0448 -- -- -- -- -- -- 106.8 kg  05/26/20 0430 -- -- -- 87 16 95 % --  05/26/20 0400 (!) 149/66 98.3 F (36.8 C) Oral 90 16 94 % --  05/26/20 0330 -- -- -- 97 16 94 % --  05/26/20 0300 (!) 155/65 -- -- (!) 102 13 91 % --  05/26/20 0230 (!) 174/74 -- -- (!) 101 13 93 % --  05/26/20 0200 (!) 183/76 -- -- (!) 101 13 95 % --  05/26/20 0130 -- -- -- 99 11 96 % --  05/26/20 0117 -- -- -- -- -- 98 % --  05/26/20 0100 (!) 147/74 -- -- 90 18 99 % --  05/26/20 0030 -- -- -- 88 (!) 22 91 % --  05/26/20 0000 131/62 99.3 F (37.4 C) Oral 72 (!) 25 96 % --  05/25/20 2330 -- -- -- 86 (!) 23 (!) 88 % --  05/25/20 2300 (!) 177/73 -- -- 90 (!) 23 (!) 89 % --  05/25/20 2230 -- -- -- 63 20 97 % --  05/25/20 2200 125/63 -- -- 61 (!) 27 99 % --  05/25/20 2130 -- -- -- (!) 59 (!) 28 99 % --  05/25/20 2100 123/66 -- -- 60 (!) 29 100 % --  05/25/20 2030 -- -- -- (!) 59 (!) 28 100 % --  05/25/20 2000 (!) 141/79 99.1 F (37.3 C) Oral 85 (!) 31 99 % --  05/25/20 1930 -- -- -- 84 (!) 27 98 % --  05/25/20 1925 -- -- -- -- -- 98 % --  05/25/20 1900 119/63 -- -- 84 (!) 29 96 % --  05/25/20 1400 124/65 -- -- 82 (!) 31 93 % --  remains  intubated Sedated Chest b/l air entry Hs s1s2 Rt IJ triple lumen Foley Cns cannot be assessed  CBC Latest Ref Rng & Units 05/26/2020 05/25/2020 05/24/2020  WBC 4.0 - 10.5 K/uL 14.1(H) 10.9(H) 9.9  Hemoglobin 13.0 - 17.0 g/dL 7.0(B) 8.6(L) 5.4(G)  Hematocrit 39.0 - 52.0 % 29.3(L) 28.3(L) 28.1(L)  Platelets 150 - 400 K/uL 303 290 293    CMP Latest Ref Rng & Units 05/26/2020 05/25/2020 05/24/2020  Glucose 70 - 99 mg/dL 920(F) 007(H) 219(X)  BUN 8 - 23 mg/dL 588(T) 25(Q) 98(Y)  Creatinine 0.61 - 1.24 mg/dL 6.41(R) 8.30(N) 4.07(W)  Sodium 135 - 145 mmol/L 144 145 146(H)  Potassium 3.5 - 5.1 mmol/L 4.8 3.9 3.8  Chloride  98 - 111 mmol/L 109 109 109  CO2 22 - 32 mmol/L 20(L) 22 23  Calcium 8.9 - 10.3 mg/dL 8.0(L) 7.9(L) 8.2(L)  Total Protein 6.5 - 8.1 g/dL - - -  Total Bilirubin 0.3 - 1.2 mg/dL - - -  Alkaline Phos 38 - 126 U/L - - -  AST 15 - 41 U/L - - -  ALT 0 - 44 U/L - - -    Impression/recommendation  Acute encephalopathy on admission secondary to DKA with hyponatremia.  CT of the head had no acute findings.  MRI could not be done because of pacemaker  Cardiac arrest leading to resuscitation and intubation    COVID-19 illness with severe acute hypoxic respiratory failure.  Intubated.  Unable to wean him off the vent.  He is going to get tracheostomy and PEG  MRSA pneumonia has been treated with linezolid for 7 days  MSSA bacteremia and Proteus bacteremia 1 out of 4 Day 14 of appropriate antibiotic Patient has a pacemaker.  TEE was negative Repeat blood culture from 05/14/2020 was negative.  Because it is of low bioburden and TEE no vegetation a collective decision was made not to remove the pacemaker but treat the patient with 2 weeks of antibiotics and then follow cultures off antibiotic. Will DC cefazolin today  AKI.  Nonoliguric.  Anemia, had melena stools but no active bleeding  Leukocytosis if  white count is increasing  we may have to remove the Foley and the IJ  line Discussed the management with the intensivist

## 2020-05-26 NOTE — Progress Notes (Cosign Needed)
Pt with worsening acute respiratory therapy throughout the night.  Upon assessment pt noted to have increased work of breathing with accessory muscle use despite administration of prn versed and continuous fentanyl/precedex gtts. Pt severely diminished in all 4 quadrants with minimal chest rise, requiring frequent tracheal lavage with utilization of ambu bag along with sterile suctioning catheter, with return of thick copious tan secretions concerning for possible mucous plugging.  In addition, pt required 10 mg iv vecuronium along with administration of iv steroids O2 sats currently @94 % and improvement in chest rise.  Will continue to monitor and assess pt.  , AGNP  Pulmonary/Critical Care Pager [phone] (please enter 7 digits) PCCM Consult Pager [phone] (please enter 7 digits)

## 2020-05-26 NOTE — Progress Notes (Signed)
Inpatient Diabetes Program Recommendations  AACE/ADA: New Consensus Statement on Inpatient Glycemic Control   Target Ranges:  Prepandial:   less than 140 mg/dL      Peak postprandial:   less than 180 mg/dL (1-2 hours)      Critically ill patients:  140 - 180 mg/dL   Results for [name], Billy Barton (MRN [id number]) as of 05/26/2020 08:19  Ref. Range 05/25/2020 07:21 05/25/2020 11:09 05/25/2020 15:54 05/25/2020 19:39 05/25/2020 23:55 05/26/2020 03:42 05/26/2020 07:27  Glucose-Capillary Latest Ref Range: 70 - 99 mg/dL 332 (H) 951 (H) 884 (H) 162 (H) 180 (H) 251 (H) 273 (H)   Review of Glycemic Control  Current orders for Inpatient glycemic control: Levemir 44 units Q12H, Novolog 0-15 units Q4H; Solumedrol 40 mg Q12H, Vital @ 45 ml/hr  Inpatient Diabetes Program Recommendations:    Insulin: Please consider ordering Novolog 4 units Q4H for tube feeding coverage. If tube feeding is stopped or held then Novolog tube feeding coverage should also be stopped or held.  Thanks, Orlando Penner, RN, MSN, CDE Diabetes Coordinator Inpatient Diabetes Program [phone] (Team Pager from 8am to 5pm)

## 2020-05-26 NOTE — Progress Notes (Signed)
[facility]  ROUNDING NOTE   Subjective:   01/25 0701 - 01/26 0700 In: 1975.9 [I.V.:1180.3; NG/GT:495; IV Piggyback:300.6] Out: 2000 [Urine:1550; Stool:450]  Creatinine 3.17 (3.16) (3.23) ( 3.42) (3.67) (4.15) Na 144 (145) (146) (147) (150)  UOP 1575mL.   Objective:  Vital signs in last 24 hours:  Temp:  [98.3 F (36.8 C)-99.3 F (37.4 C)] 98.4 F (36.9 C) (01/26 0800) Pulse Rate:  [59-102] 72 (01/26 1300) Resp:  [11-31] 15 (01/26 1300) BP: (119-183)/(62-86) 141/81 (01/26 1300) SpO2:  [88 %-100 %] 98 % (01/26 1300) FiO2 (%):  [35 %-50 %] 35 % (01/26 0800) Weight:  [106.8 kg] 106.8 kg (01/26 0448)  Weight change:  Filed Weights   05/22/20 0500 05/23/20 0500 05/26/20 0448  Weight: 107.8 kg 106.4 kg 106.8 kg    Intake/Output: I/O last 3 completed shifts: In: 2020.9 [I.V.:1180.3; Other:45; NG/GT:495; IV Piggyback:300.6] Out: 3750 [Urine:2700; Stool:1050]   Intake/Output this shift:  Total I/O In: 318.5 [I.V.:318.5] Out: 800 [Urine:800]  Physical Exam: General:  Critically ill  Head:  ETT  Eyes:  Anicteric  Neck:  trachea midline  Lungs:   Pressure Control FiO2 35%  Heart:  regular  Abdomen:   Soft, obese, bowel sounds present  Extremities:  ++ peripheral edema.  Neurologic:  Intubated, sedated  Skin:  No rash  Access:  No dialysis access    Basic Metabolic Panel: Recent Labs  Lab 05/22/20 0438 05/23/20 0443 05/24/20 0512 05/25/20 0536 05/26/20 0446  NA 150* 147* 146* 145 144  K 3.4* 3.6 3.8 3.9 4.8  CL 115* 112* 109 109 109  CO2 $Re'22 23 23 22 'gKP$ 20*  GLUCOSE 103* 191* 159* 170* 311*  BUN 94* 88* 89* 97* 108*  CREATININE 3.67* 3.42* 3.23* 3.16* 3.17*  CALCIUM 7.6* 7.8* 8.2* 7.9* 8.0*  MG 2.3 2.3 2.5* 2.4 2.5*  PHOS 5.4* 5.9* 7.2* 8.9* 11.7*    Liver Function Tests: No results for input(s): AST, ALT, ALKPHOS, BILITOT, PROT, ALBUMIN in the last 168 hours. No results for input(s): LIPASE, AMYLASE in the last 168 hours. Recent Labs  Lab  05/21/20 1207  AMMONIA 17    CBC: Recent Labs  Lab 05/22/20 0438 05/23/20 0443 05/24/20 0512 05/25/20 0536 05/26/20 0446  WBC 11.7* 11.4* 9.9 10.9* 14.1*  NEUTROABS 8.2* 8.6* 7.3 8.4* 13.1*  HGB 8.4* 8.5* 8.7* 8.7* 8.7*  HCT 27.3* 28.4* 28.1* 28.3* 29.3*  MCV 96.1 96.3 96.2 96.3 99.7  PLT 270 280 293 290 303    Cardiac Enzymes: No results for input(s): CKTOTAL, CKMB, CKMBINDEX, TROPONINI in the last 168 hours.  BNP: Invalid input(s): POCBNP  CBG: Recent Labs  Lab 05/25/20 1939 05/25/20 2355 05/26/20 0342 05/26/20 0727 05/26/20 1135  GLUCAP 162* 180* 251* 273* 63*    Microbiology: Results for orders placed or performed during the hospital encounter of 05/28/2020  Blood culture (routine single)     Status: Abnormal   Collection Time: 05/18/2020 10:24 PM   Specimen: BLOOD  Result Value Ref Range Status   Specimen Description   Final    BLOOD LEFT ASSIST CONTROL Performed at [facility], [address], [place], [place] [address]    Special Requests   Final    BOTTLES DRAWN AEROBIC AND ANAEROBIC Blood Culture adequate volume Performed at [facility], [address] [place], [place] [address]    Culture  Setup Time   Final    Organism ID to follow [place]  CRITICAL RESULT CALLED TO, READ BACK BY AND VERIFIED WITH: MYRA SLAUGHTER AT 1309 ON 05/13/20 SNG GRAM NEGATIVE RODS ANAEROBIC BOTTLE ONLY CRITICAL RESULT CALLED TO, READ BACK BY AND VERIFIED WITH: MORGAN HICKS PHARMD $[email or web address]  05/14/20 EB Performed at [facility], [place] [address], [place], [place] [address]    Culture STAPHYLOCOCCUS AUREUS PROTEUS MIRABILIS  (A)  Final   Report Status 05/16/2020 FINAL  Final   Organism ID, Bacteria STAPHYLOCOCCUS AUREUS  Final   Organism ID, Bacteria PROTEUS MIRABILIS  Final      Susceptibility   Proteus mirabilis - MIC*    AMPICILLIN <=2 SENSITIVE Sensitive     CEFAZOLIN <=4 SENSITIVE Sensitive      CEFEPIME <=0.12 SENSITIVE Sensitive     CEFTAZIDIME <=1 SENSITIVE Sensitive     CEFTRIAXONE <=0.25 SENSITIVE Sensitive     CIPROFLOXACIN <=0.25 SENSITIVE Sensitive     GENTAMICIN <=1 SENSITIVE Sensitive     IMIPENEM 8 INTERMEDIATE Intermediate     TRIMETH/SULFA <=20 SENSITIVE Sensitive     AMPICILLIN/SULBACTAM <=2 SENSITIVE Sensitive     PIP/TAZO <=4 SENSITIVE Sensitive     * PROTEUS MIRABILIS   Staphylococcus aureus - MIC*    CIPROFLOXACIN <=0.5 SENSITIVE Sensitive     ERYTHROMYCIN <=0.25 SENSITIVE Sensitive     GENTAMICIN <=0.5 SENSITIVE Sensitive     OXACILLIN 0.5 SENSITIVE Sensitive     TETRACYCLINE <=1 SENSITIVE Sensitive     VANCOMYCIN 1 SENSITIVE Sensitive     TRIMETH/SULFA <=10 SENSITIVE Sensitive     CLINDAMYCIN <=0.25 SENSITIVE Sensitive     RIFAMPIN <=0.5 SENSITIVE Sensitive     Inducible Clindamycin NEGATIVE Sensitive     * STAPHYLOCOCCUS AUREUS  Culture, blood (single)     Status: None   Collection Time: 05/09/2020 10:24 PM   Specimen: BLOOD  Result Value Ref Range Status   Specimen Description BLOOD LEFT HAND  Final   Special Requests   Final    BOTTLES DRAWN AEROBIC AND ANAEROBIC Blood Culture adequate volume   Culture   Final    NO GROWTH 5 DAYS Performed at [facility], [address] [place], [place] [address]    Report Status 05/17/2020 FINAL  Final  Blood Culture ID Panel (Reflexed)     Status: Abnormal   Collection Time: 05/30/2020 10:24 PM  Result Value Ref Range Status   Enterococcus faecalis NOT DETECTED NOT DETECTED Final   Enterococcus Faecium NOT DETECTED NOT DETECTED Final   Listeria monocytogenes NOT DETECTED NOT DETECTED Final   Staphylococcus species DETECTED (A) NOT DETECTED Final    Comment: CRITICAL RESULT CALLED TO, READ BACK BY AND VERIFIED WITH: MYRA SLAUGHTER AT 1309 ON 05/13/20 SNG    Staphylococcus aureus (BCID) DETECTED (A) NOT DETECTED Final    Comment: CRITICAL RESULT CALLED TO, READ BACK BY AND VERIFIED WITH: MYRA  SLAUGHTER AT 1309 05/13/20 SNG    Staphylococcus epidermidis NOT DETECTED NOT DETECTED Final   Staphylococcus lugdunensis NOT DETECTED NOT DETECTED Final   Streptococcus species NOT DETECTED NOT DETECTED Final   Streptococcus agalactiae NOT DETECTED NOT DETECTED Final   Streptococcus pneumoniae NOT DETECTED NOT DETECTED Final   Streptococcus pyogenes NOT DETECTED NOT DETECTED Final   A.calcoaceticus-baumannii NOT DETECTED NOT DETECTED Final   Bacteroides fragilis NOT DETECTED NOT DETECTED Final   Enterobacterales NOT DETECTED NOT DETECTED Final   Enterobacter cloacae complex NOT DETECTED NOT DETECTED Final   Escherichia coli NOT DETECTED NOT DETECTED Final   Klebsiella aerogenes NOT DETECTED NOT DETECTED Final  Klebsiella oxytoca NOT DETECTED NOT DETECTED Final   Klebsiella pneumoniae NOT DETECTED NOT DETECTED Final   Proteus species NOT DETECTED NOT DETECTED Final   Salmonella species NOT DETECTED NOT DETECTED Final   Serratia marcescens NOT DETECTED NOT DETECTED Final   Haemophilus influenzae NOT DETECTED NOT DETECTED Final   Neisseria meningitidis NOT DETECTED NOT DETECTED Final   Pseudomonas aeruginosa NOT DETECTED NOT DETECTED Final   Stenotrophomonas maltophilia NOT DETECTED NOT DETECTED Final   Candida albicans NOT DETECTED NOT DETECTED Final   Candida auris NOT DETECTED NOT DETECTED Final   Candida glabrata NOT DETECTED NOT DETECTED Final   Candida krusei NOT DETECTED NOT DETECTED Final   Candida parapsilosis NOT DETECTED NOT DETECTED Final   Candida tropicalis NOT DETECTED NOT DETECTED Final   Cryptococcus neoformans/gattii NOT DETECTED NOT DETECTED Final   Meth resistant mecA/C and MREJ NOT DETECTED NOT DETECTED Final    Comment: Performed at [facility], [address], [place], [place] [address]  Urine culture     Status: None   Collection Time: 05/13/20 12:26 AM   Specimen: In/Out Cath Urine  Result Value Ref Range Status   Specimen Description   Final     IN/OUT CATH URINE Performed at [facility], [address] [place], [place] [address]    Special Requests   Final    NONE Performed at [facility], [address] [place], [place] [address]    Culture   Final    NO GROWTH Performed at [facility], [address] [place], [place] [address]    Report Status 05/14/2020 FINAL  Final  Resp Panel by RT-PCR (Flu A&B, Covid) Nasopharyngeal Swab     Status: None   Collection Time: 05/13/20 12:26 AM   Specimen: Nasopharyngeal Swab; Nasopharyngeal(NP) swabs in vial transport medium  Result Value Ref Range Status   SARS Coronavirus 2 by RT PCR NEGATIVE NEGATIVE Final    Comment: (NOTE) SARS-CoV-2 target nucleic acids are NOT DETECTED.  The SARS-CoV-2 RNA is generally detectable in upper respiratory specimens during the acute phase of infection. The lowest concentration of SARS-CoV-2 viral copies this assay can detect is 138 copies/mL. A negative result does not preclude SARS-Cov-2 infection and should not be used as the sole basis for treatment or other patient management decisions. A negative result may occur with  improper specimen collection/handling, submission of specimen other than nasopharyngeal swab, presence of viral mutation(s) within the areas targeted by this assay, and inadequate number of viral copies(<138 copies/mL). A negative result must be combined with clinical observations, patient history, and epidemiological information. The expected result is Negative.  Fact Sheet for Patients:  EntrepreneurPulse.com.au  Fact Sheet for Healthcare Providers:  IncredibleEmployment.be  This test is no t yet approved or cleared by the [place] FDA and  has been authorized for detection and/or diagnosis of SARS-CoV-2 by FDA under an Emergency Use Authorization (EUA). This EUA will remain  in effect (meaning this test can be used) for the duration of the COVID-19  declaration under Section 564(b)(1) of the Act, 21 U.S.C.section 360bbb-3(b)(1), unless the authorization is terminated  or revoked sooner.       Influenza A by PCR NEGATIVE NEGATIVE Final   Influenza B by PCR NEGATIVE NEGATIVE Final    Comment: (NOTE) The Xpert Xpress SARS-CoV-2/FLU/RSV plus assay is intended as an aid in the diagnosis of influenza from Nasopharyngeal swab specimens and should not be used as a sole basis for treatment.  Nasal washings and aspirates are unacceptable for Xpert Xpress SARS-CoV-2/FLU/RSV testing.  Fact Sheet for Patients: EntrepreneurPulse.com.au  Fact Sheet for Healthcare Providers: IncredibleEmployment.be  This test is not yet approved or cleared by the [place] FDA and has been authorized for detection and/or diagnosis of SARS-CoV-2 by FDA under an Emergency Use Authorization (EUA). This EUA will remain in effect (meaning this test can be used) for the duration of the COVID-19 declaration under Section 564(b)(1) of the Act, 21 U.S.C. section 360bbb-3(b)(1), unless the authorization is terminated or revoked.  Performed at [facility], [address] [place], [place] [address]   Culture, blood (Routine X 2) w Reflex to ID Panel     Status: None   Collection Time: 05/14/20  2:41 PM   Specimen: BLOOD  Result Value Ref Range Status   Specimen Description BLOOD RIGHT ANTECUBITAL  Final   Special Requests   Final    BOTTLES DRAWN AEROBIC AND ANAEROBIC Blood Culture adequate volume   Culture   Final    NO GROWTH 5 DAYS Performed at [facility], [address] [place], [place] [address]    Report Status 05/19/2020 FINAL  Final  Culture, blood (Routine X 2) w Reflex to ID Panel     Status: None   Collection Time: 05/14/20  2:55 PM   Specimen: BLOOD  Result Value Ref Range Status   Specimen Description BLOOD BLOOD RIGHT HAND  Final   Special Requests   Final    BOTTLES DRAWN AEROBIC  AND ANAEROBIC Blood Culture results may not be optimal due to an excessive volume of blood received in culture bottles   Culture   Final    NO GROWTH 5 DAYS Performed at [facility], [address] [place], [place] [address]    Report Status 05/19/2020 FINAL  Final  Culture, respiratory (non-expectorated)     Status: None   Collection Time: 05/14/20  5:34 PM   Specimen: Tracheal Aspirate; Respiratory  Result Value Ref Range Status   Specimen Description   Final    TRACHEAL ASPIRATE Performed at [facility], [address], [place], [place] [address]    Special Requests   Final    NONE Performed at [facility], [address] [place], [place] [address]    Gram Stain   Final    MODERATE WBC PRESENT, PREDOMINANTLY PMN FEW GRAM POSITIVE COCCI IN CLUSTERS Performed at [facility], [place] [address] [place], [place] [address]    Culture RARE METHICILLIN RESISTANT STAPHYLOCOCCUS AUREUS  Final   Report Status 05/20/2020 FINAL  Final   Organism ID, Bacteria METHICILLIN RESISTANT STAPHYLOCOCCUS AUREUS  Final      Susceptibility   Methicillin resistant staphylococcus aureus - MIC*    CIPROFLOXACIN >=8 RESISTANT Resistant     ERYTHROMYCIN >=8 RESISTANT Resistant     GENTAMICIN <=0.5 SENSITIVE Sensitive     OXACILLIN >=4 RESISTANT Resistant     TETRACYCLINE <=1 SENSITIVE Sensitive     VANCOMYCIN <=0.5 SENSITIVE Sensitive     TRIMETH/SULFA <=10 SENSITIVE Sensitive     CLINDAMYCIN >=8 RESISTANT Resistant     RIFAMPIN <=0.5 SENSITIVE Sensitive     Inducible Clindamycin NEGATIVE Sensitive     * RARE METHICILLIN RESISTANT STAPHYLOCOCCUS AUREUS  MRSA PCR Screening     Status: Abnormal   Collection Time: 05/14/20  9:04 PM   Specimen: Nasopharyngeal  Result Value Ref Range Status   MRSA by PCR POSITIVE (A) NEGATIVE Final  Comment:        The GeneXpert MRSA Assay (FDA approved for NASAL specimens only), is one component of a comprehensive MRSA  colonization surveillance program. It is not intended to diagnose MRSA infection nor to guide or monitor treatment for MRSA infections. RESULT CALLED TO, READ BACK BY AND VERIFIED WITH: MELISSA COBB AT [phone] 05/15/20.PMF Performed at [facility], [address] [place], [place] [address]   Culture, respiratory     Status: None   Collection Time: 05/22/20 11:26 AM   Specimen: Tracheal Aspirate; Respiratory  Result Value Ref Range Status   Specimen Description   Final    TRACHEAL ASPIRATE Performed at [facility], [address] [place], [place] [address]    Special Requests   Final    NONE Performed at [facility], [address] [place], [place] [address]    Gram Stain   Final    RARE WBC PRESENT, PREDOMINANTLY PMN NO ORGANISMS SEEN    Culture   Final    NO GROWTH 2 DAYS Performed at [facility], [place] [address] [place], [place] [address]    Report Status 05/24/2020 FINAL  Final    Coagulation Studies: No results for input(s): LABPROT, INR in the last 72 hours.  Urinalysis: No results for input(s): COLORURINE, LABSPEC, PHURINE, GLUCOSEU, HGBUR, BILIRUBINUR, KETONESUR, PROTEINUR, UROBILINOGEN, NITRITE, LEUKOCYTESUR in the last 72 hours.  Invalid input(s): APPERANCEUR    Imaging: DG Chest Port 1 View  Result Date: 05/26/2020 CLINICAL DATA:  Respiratory failure.  Intubation. EXAM: PORTABLE CHEST 1 VIEW COMPARISON:  05/24/2020. FINDINGS: Endotracheal tube, NG tube, right IJ line in stable position. Cardiac pacer stable position. Cardiomegaly with mild pulmonary venous congestion. Low lung volumes with mild bibasilar atelectasis and infiltrates/edema. No pleural effusion. No pneumothorax. IMPRESSION: 1. Lines and tubes in stable position. 2. Cardiomegaly with mild pulmonary venous congestion. 3. Low lung volumes with mild bibasilar atelectasis and infiltrates/edema. Electronically Signed   By: Marcello Moores  Register   On: 05/26/2020 07:56    DG Chest Port 1 View  Result Date: 05/24/2020 CLINICAL DATA:  Acute respiratory failure EXAM: PORTABLE CHEST 1 VIEW COMPARISON:  May 22, 2020 FINDINGS: The endotracheal tube terminates above the carina. The left-sided pacemaker/ICD is unchanged in positioning. The right-sided central venous catheter is unchanged in positioning. There is no pneumothorax. The enteric tube extends below the left hemidiaphragm. There is no large pleural effusion. There is improved retrocardiac aeration. There is atelectasis at the lung bases. IMPRESSION: 1. Lines and tubes as above. 2. Improved aeration in the lung bases. Electronically Signed   By: Constance Holster M.D.   On: 05/24/2020 21:49     Medications:   . sodium chloride Stopped (05/20/20 2308)  .  ceFAZolin (ANCEF) IV 2 g (05/26/20 0941)  . dexmedetomidine (PRECEDEX) IV infusion 1 mcg/kg/hr (05/26/20 1212)  . feeding supplement (VITAL AF 1.2 CAL) 1,000 mL (05/24/20 1449)  . fentaNYL infusion INTRAVENOUS 150 mcg/hr (05/26/20 1314)   . chlorhexidine gluconate (MEDLINE KIT)  15 mL Mouth Rinse BID  . Chlorhexidine Gluconate Cloth  6 each Topical Q0600  . feeding supplement (PROSource TF)  90 mL Per Tube TID  . free water  200 mL Per Tube Q4H  . insulin aspart  0-15 Units Subcutaneous Q4H  . insulin detemir  44 Units Subcutaneous Q12H  . mouth rinse  15 mL Mouth Rinse 10 times per day  . methylPREDNISolone (SOLU-MEDROL) injection  20 mg Intravenous Q12H  . multivitamin  with minerals  1 tablet Per Tube Daily  . pantoprazole  40 mg Intravenous Daily  . thiamine injection  100 mg Intravenous Daily   sodium chloride, acetaminophen (TYLENOL) oral liquid 160 mg/5 mL, acetaminophen, dextrose, docusate, fentaNYL (SUBLIMAZE) injection, ipratropium-albuterol, metoprolol tartrate, midazolam, polyethylene glycol, vecuronium  Assessment/ Plan:    [name] is a 75 y.o. white male with coronary artery disease, diabetes mellitus type 2, history of  AICD placement who was admitted to [facility] on 05/16/2020 for evaluation of altered mental status and DKA  1.  Acute kidney injury, baseline creatinine currently unknown. Renal ultrasound consistent with chronic kidney disease.  2. Proteinuria and glycosuria consistent with diabetic nephropathy.  3.  Hypernatremia. Free water deficit improving.  4.  Acute respiratory failure secondary to MRSA pneumonia : tracheostomy for today.  5. Sepsis with MSSA  And proteus mirabilis bacteremia.  Plan: No indication for dialysis.  - hold furosemide - Continue free water flushes     LOS: 14 Aveena Bari 1/26/20222:21 PM

## 2020-05-26 NOTE — Progress Notes (Signed)
PHARMACY CONSULT NOTE  Pharmacy Consult for Electrolyte Monitoring and Replacement   Recent Labs: Potassium (mmol/L)  Date Value  05/26/2020 4.8   Magnesium (mg/dL)  Date Value  36/14/4315 2.5 (H)   Calcium (mg/dL)  Date Value  40/11/6759 8.0 (L)   Albumin (g/dL)  Date Value  95/12/3265 1.6 (L)   Phosphorus (mg/dL)  Date Value  12/45/8099 11.7 (H)   Sodium (mmol/L)  Date Value  05/26/2020 144   Assessment: 75 year old male admitted with metabolic acidosis s/t DKA vs HHS. Code Blue called after arrival to ICU and patient intubated. Blood cultures positive for MSSA.  Pharmacy to manage electrolytes.  Nutrition: ProSource 90 mL TID + Vital 45 mL/hr Diuretics: furosemide 40 mg IV daily starting 1/22  Goal of Therapy:  Electrolytes WNL  Plan:   Hypernatremia: Improved, continue free water flushes 200 mL q4h (1.2 L/day)  Hyperphosphatemia: will defer management to primary team / nephrology  No electrolyte replacement warranted today  Re-check electrolytes in AM  Tressie Ellis 05/26/2020 8:31 AM

## 2020-05-26 NOTE — Progress Notes (Signed)
05/26/2020 1:32 PM  Billy Barton [id number]  Preop for trach   Temp:  [98.3 F (36.8 C)-99.3 F (37.4 C)] 98.4 F (36.9 C) (01/26 0800) Pulse Rate:  [59-102] 70 (01/26 1200) Resp:  [11-31] 14 (01/26 1200) BP: (119-183)/(62-86) 142/86 (01/26 1200) SpO2:  [88 %-100 %] 98 % (01/26 1200) FiO2 (%):  [35 %-50 %] 35 % (01/26 0800) Weight:  [106.8 kg] 106.8 kg (01/26 0448),     Intake/Output Summary (Last 24 hours) at 05/26/2020 1332 Last data filed at 05/26/2020 1212 Gross per 24 hour  Intake 2294.34 ml  Output 2800 ml  Net -505.66 ml    Results for orders placed or performed during the hospital encounter of 05/09/2020 (from the past 24 hour(s))  Glucose, capillary     Status: Abnormal   Collection Time: 05/25/20  3:54 PM  Result Value Ref Range   Glucose-Capillary 156 (H) 70 - 99 mg/dL  Glucose, capillary     Status: Abnormal   Collection Time: 05/25/20  7:39 PM  Result Value Ref Range   Glucose-Capillary 162 (H) 70 - 99 mg/dL  Glucose, capillary     Status: Abnormal   Collection Time: 05/25/20 11:55 PM  Result Value Ref Range   Glucose-Capillary 180 (H) 70 - 99 mg/dL  Glucose, capillary     Status: Abnormal   Collection Time: 05/26/20  3:42 AM  Result Value Ref Range   Glucose-Capillary 251 (H) 70 - 99 mg/dL  Magnesium     Status: Abnormal   Collection Time: 05/26/20  4:46 AM  Result Value Ref Range   Magnesium 2.5 (H) 1.7 - 2.4 mg/dL  Phosphorus     Status: Abnormal   Collection Time: 05/26/20  4:46 AM  Result Value Ref Range   Phosphorus 11.7 (H) 2.5 - 4.6 mg/dL  CBC with Differential/Platelet     Status: Abnormal   Collection Time: 05/26/20  4:46 AM  Result Value Ref Range   WBC 14.1 (H) 4.0 - 10.5 K/uL   RBC 2.94 (L) 4.22 - 5.81 MIL/uL   Hemoglobin 8.7 (L) 13.0 - 17.0 g/dL   HCT 44.8 (L) 18.5 - 63.1 %   MCV 99.7 80.0 - 100.0 fL   MCH 29.6 26.0 - 34.0 pg   MCHC 29.7 (L) 30.0 - 36.0 g/dL   RDW 49.7 (H) 02.6 - 37.8 %   Platelets 303 150 - 400 K/uL   nRBC 0.0  0.0 - 0.2 %   Neutrophils Relative % 93 %   Neutro Abs 13.1 (H) 1.7 - 7.7 K/uL   Lymphocytes Relative 3 %   Lymphs Abs 0.4 (L) 0.7 - 4.0 K/uL   Monocytes Relative 2 %   Monocytes Absolute 0.2 0.1 - 1.0 K/uL   Eosinophils Relative 0 %   Eosinophils Absolute 0.0 0.0 - 0.5 K/uL   Basophils Relative 0 %   Basophils Absolute 0.0 0.0 - 0.1 K/uL   Immature Granulocytes 2 %   Abs Immature Granulocytes 0.22 (H) 0.00 - 0.07 K/uL  Basic metabolic panel     Status: Abnormal   Collection Time: 05/26/20  4:46 AM  Result Value Ref Range   Sodium 144 135 - 145 mmol/L   Potassium 4.8 3.5 - 5.1 mmol/L   Chloride 109 98 - 111 mmol/L   CO2 20 (L) 22 - 32 mmol/L   Glucose, Bld 311 (H) 70 - 99 mg/dL   BUN 588 (H) 8 - 23 mg/dL   Creatinine, Ser 5.02 (H) 0.61 - 1.24 mg/dL  Calcium 8.0 (L) 8.9 - 10.3 mg/dL   GFR, Estimated 20 (L) >60 mL/min   Anion gap 15 5 - 15  Glucose, capillary     Status: Abnormal   Collection Time: 05/26/20  7:27 AM  Result Value Ref Range   Glucose-Capillary 273 (H) 70 - 99 mg/dL  Glucose, capillary     Status: Abnormal   Collection Time: 05/26/20 11:35 AM  Result Value Ref Range   Glucose-Capillary 292 (H) 70 - 99 mg/dL    IMPRESSION:  I spoke with Billy Barton, Billy Barton's son.  I discussed the risks and benefits for tracheostomy including, bleeding, infection, loss of airway, pneumothorax, and death.  He is eager to proceed   PLAN:  Tracheostomy early next week, will coordinate with Dr. Everlene Farrier for Gtube placement during the same anesthesia.    Davina Poke 05/26/2020, 1:32 PM

## 2020-05-27 DIAGNOSIS — N179 Acute kidney failure, unspecified: Secondary | ICD-10-CM | POA: Diagnosis not present

## 2020-05-27 DIAGNOSIS — R401 Stupor: Secondary | ICD-10-CM | POA: Diagnosis not present

## 2020-05-27 DIAGNOSIS — R4182 Altered mental status, unspecified: Secondary | ICD-10-CM | POA: Diagnosis not present

## 2020-05-27 DIAGNOSIS — Z95810 Presence of automatic (implantable) cardiac defibrillator: Secondary | ICD-10-CM | POA: Diagnosis not present

## 2020-05-27 DIAGNOSIS — J9601 Acute respiratory failure with hypoxia: Secondary | ICD-10-CM | POA: Diagnosis not present

## 2020-05-27 LAB — CBC WITH DIFFERENTIAL/PLATELET
Abs Immature Granulocytes: 0.07 10*3/uL (ref 0.00–0.07)
Basophils Absolute: 0 10*3/uL (ref 0.0–0.1)
Basophils Relative: 0 %
Eosinophils Absolute: 0.2 10*3/uL (ref 0.0–0.5)
Eosinophils Relative: 2 %
HCT: 26.4 % — ABNORMAL LOW (ref 39.0–52.0)
Hemoglobin: 8.5 g/dL — ABNORMAL LOW (ref 13.0–17.0)
Immature Granulocytes: 1 %
Lymphocytes Relative: 14 %
Lymphs Abs: 1.3 10*3/uL (ref 0.7–4.0)
MCH: 30.1 pg (ref 26.0–34.0)
MCHC: 32.2 g/dL (ref 30.0–36.0)
MCV: 93.6 fL (ref 80.0–100.0)
Monocytes Absolute: 0.6 10*3/uL (ref 0.1–1.0)
Monocytes Relative: 7 %
Neutro Abs: 7.1 10*3/uL (ref 1.7–7.7)
Neutrophils Relative %: 76 %
Platelets: 279 10*3/uL (ref 150–400)
RBC: 2.82 MIL/uL — ABNORMAL LOW (ref 4.22–5.81)
RDW: 16.6 % — ABNORMAL HIGH (ref 11.5–15.5)
WBC: 9.2 10*3/uL (ref 4.0–10.5)
nRBC: 0 % (ref 0.0–0.2)

## 2020-05-27 LAB — BASIC METABOLIC PANEL
Anion gap: 13 (ref 5–15)
BUN: 99 mg/dL — ABNORMAL HIGH (ref 8–23)
CO2: 20 mmol/L — ABNORMAL LOW (ref 22–32)
Calcium: 8.1 mg/dL — ABNORMAL LOW (ref 8.9–10.3)
Chloride: 112 mmol/L — ABNORMAL HIGH (ref 98–111)
Creatinine, Ser: 2.68 mg/dL — ABNORMAL HIGH (ref 0.61–1.24)
GFR, Estimated: 24 mL/min — ABNORMAL LOW (ref >60)
Glucose, Bld: 225 mg/dL — ABNORMAL HIGH (ref 70–99)
Potassium: 3.7 mmol/L (ref 3.5–5.1)
Sodium: 145 mmol/L (ref 135–145)

## 2020-05-27 LAB — GLUCOSE, CAPILLARY
Glucose-Capillary: 178 mg/dL — ABNORMAL HIGH (ref 70–99)
Glucose-Capillary: 194 mg/dL — ABNORMAL HIGH (ref 70–99)
Glucose-Capillary: 218 mg/dL — ABNORMAL HIGH (ref 70–99)
Glucose-Capillary: 240 mg/dL — ABNORMAL HIGH (ref 70–99)
Glucose-Capillary: 245 mg/dL — ABNORMAL HIGH (ref 70–99)
Glucose-Capillary: 285 mg/dL — ABNORMAL HIGH (ref 70–99)

## 2020-05-27 LAB — MAGNESIUM: Magnesium: 2.4 mg/dL (ref 1.7–2.4)

## 2020-05-27 LAB — PHOSPHORUS: Phosphorus: 7.1 mg/dL — ABNORMAL HIGH (ref 2.5–4.6)

## 2020-05-27 MED ORDER — DIAZEPAM 5 MG/ML IJ SOLN
INTRAMUSCULAR | Status: AC
  Administered 2020-05-27: 10 mg via INTRAVENOUS
  Filled 2020-05-27: qty 2

## 2020-05-27 MED ORDER — DIAZEPAM 5 MG/ML IJ SOLN: 10.0000 mg | INTRAMUSCULAR | Status: AC

## 2020-05-27 MED ORDER — INSULIN ASPART 100 UNIT/ML ~~LOC~~ SOLN
0.0000 [IU] | SUBCUTANEOUS | Status: DC
  Administered 2020-05-27: 7 [IU] via SUBCUTANEOUS
  Administered 2020-05-28 (×2): 11 [IU] via SUBCUTANEOUS
  Administered 2020-05-28 (×2): 7 [IU] via SUBCUTANEOUS
  Administered 2020-05-28 (×3): 11 [IU] via SUBCUTANEOUS
  Administered 2020-05-29 (×4): 7 [IU] via SUBCUTANEOUS
  Administered 2020-05-29: 11 [IU] via SUBCUTANEOUS
  Administered 2020-05-29: 7 [IU] via SUBCUTANEOUS
  Administered 2020-05-30: 8 [IU] via SUBCUTANEOUS
  Administered 2020-05-30 (×2): 4 [IU] via SUBCUTANEOUS
  Administered 2020-05-30 – 2020-05-31 (×2): 3 [IU] via SUBCUTANEOUS
  Filled 2020-05-27 (×19): qty 1

## 2020-05-27 MED ORDER — HYDROMORPHONE HCL PF 10 MG/ML IJ SOLN
1.0000 mg/h | INTRAMUSCULAR | Status: DC
Start: 2020-05-27 — End: 2020-05-31
  Administered 2020-05-27: 2 mg/h via INTRAVENOUS
  Administered 2020-05-28 – 2020-05-30 (×2): 1 mg/h via INTRAVENOUS
  Filled 2020-05-27 (×3): qty 5

## 2020-05-27 MED ORDER — PROSOURCE TF PO LIQD
45.0000 mL | Freq: Two times a day (BID) | ORAL | Status: DC
  Administered 2020-05-27 – 2020-05-31 (×8): 45 mL
  Filled 2020-05-27: qty 45

## 2020-05-27 MED ORDER — DIAZEPAM 5 MG PO TABS
5.0000 mg | ORAL_TABLET | Freq: Four times a day (QID) | ORAL | Status: DC
  Administered 2020-05-27: 5 mg via ORAL
  Filled 2020-05-27: qty 1

## 2020-05-27 MED ORDER — DIAZEPAM 5 MG PO TABS
5.0000 mg | ORAL_TABLET | Freq: Four times a day (QID) | ORAL | Status: DC
  Administered 2020-05-27 – 2020-05-29 (×6): 5 mg
  Filled 2020-05-27 (×6): qty 1

## 2020-05-27 MED ORDER — FREE WATER
150.0000 mL | Status: DC
  Administered 2020-05-27 – 2020-05-29 (×13): 150 mL

## 2020-05-27 MED ORDER — HYDROMORPHONE HCL 1 MG/ML IJ SOLN: 2.0000 mg | Freq: Once | INTRAMUSCULAR | Status: AC

## 2020-05-27 MED ORDER — HYDROMORPHONE HCL 1 MG/ML IJ SOLN
INTRAMUSCULAR | Status: AC
  Administered 2020-05-27: 2 mg via INTRAVENOUS
  Filled 2020-05-27: qty 2

## 2020-05-27 MED ORDER — VITAL AF 1.2 CAL PO LIQD
1000.0000 mL | ORAL | Status: DC
  Administered 2020-05-27 – 2020-05-29 (×4): 1000 mL

## 2020-05-27 NOTE — Progress Notes (Signed)
PHARMACY CONSULT NOTE  Pharmacy Consult for Electrolyte Monitoring and Replacement   Recent Labs: Potassium (mmol/L)  Date Value  05/27/2020 3.7   Magnesium (mg/dL)  Date Value  20/80/2233 2.4   Calcium (mg/dL)  Date Value  61/22/4497 8.1 (L)   Albumin (g/dL)  Date Value  53/00/5110 1.6 (L)   Phosphorus (mg/dL)  Date Value  21/03/7355 7.1 (H)   Sodium (mmol/L)  Date Value  05/27/2020 145   Assessment: 75 year old male admitted with metabolic acidosis s/t DKA vs HHS. Code Blue called after arrival to ICU and patient intubated. Blood cultures positive for MSSA.  Pharmacy to manage electrolytes.  Nutrition: ProSource 90 mL TID + Vital 45 mL/hr Diuretics: furosemide 40 mg IV daily (d/c 1/26)  Goal of Therapy:  Electrolytes WNL  Plan:   Hypernatremia: Improved, continue free water flushes 200 mL q4h (1.2 L/day)  Hyperphosphatemia: will defer management to primary team / nephrology  No electrolyte replacement warranted today  Re-check electrolytes in AM  Tressie Ellis 05/27/2020 9:10 AM

## 2020-05-27 NOTE — Progress Notes (Signed)
eeg done °

## 2020-05-27 NOTE — Progress Notes (Addendum)
CRITICAL CARE NOTE SUBJECTIVE:  CC  Acute respiratory failure   Patient background:  75yo male w/unknown past medical history who presented to the ED via EMS after the son found him unresponsive, face down in the kitchen, incontinent of urine. On arrival to the ED, the patient was arousable only to painful stimuli, hypothermic 84F. Per the ED, the patient son reported to them they call and check on him daily and last spoke with him yesterday. When EMS arrived, the patient was awake but became unresponsive en route to the ED. Was found to be in DKA vs HSS with a blood sugar >600 and lactate 5.8  EVENTS  05/14/20- patient with MSSA bacteremia, critially ill on mechanical ventilator. Remains on vasopressor. Insulin gtt is off today. Active GI bleed s/p PRBC transfusion. 05/15/20-patient remains hypotensive on MV. Possible EGD when more stable - spoke with GI- Dr Lacretia Nicks today. Still with edema 2+ on vasopressors.  05/16/20- patient on MV. A line not functioning will dc today. Pt in sinus rhythm remains intubated septic awaiting tee for endocarditis workup.  05/27/2020 - TEE performed at bedside.  05/18/20 - not waking up off sedation, nephrology consulted 05/22/2020 - remains unresponsive off all sedation, secretions from ETT are heavy, will repeat tracheal aspirate. FiO2 is down to 30%, patient intermittetly biting on ETT during examination. He is rhonchorous bilaterally on auscultation. Overnight patient had non bloody vomitus and OGT feeds have been held today. UOP overnight is adequate. 1/23patient with severe neurological deficit 1/24increased WOB, on vent 1/25 severe resp distress, near cardiac arrest 1/26 severe resp failure  CRITICAL CARE CHECKLIST    FULL CODE BLUE  Indwelling Urinary Catheter continued, requirement due to   Reason to continue Indwelling Urinary Catheter strict Intake/Output monitoring for hemodynamic instability   Central Line/ continued, requirement due to   Reason to continue [employer] Monitoring of central venous pressure or other hemodynamic parameters and poor IV access   Ventilator continued, requirement due to severe respiratory failure   Sedation Goal RASS 0 to -2, vented     Glycemic Control                                  CCM Protocol    PUD Prevention                                   CCM Prophylaxis Protocol    DVT Prevention          CCM Prophylaxis Protocol    MEDICATIONS: I have reviewed all medications and confirmed regimen as documented    CASE DISCUSSED IN MULTIDISCIPLINARY ROUNDS WITH ICU TEAM  OBJECTIVE:  VITALS:  Vent Mode: PCV FiO2 (%):  [35 %] 35 % Set Rate:  [16 bmp] 16 bmp PEEP:  [5 cmH20] 5 cmH20  BP (!) 147/79   Pulse 63   Temp 97.8 F (36.6 C) (Oral)   Resp 15   Ht 5' 7.99" (1.727 m)   Wt 103.2 kg   SpO2 100%   BMI 34.60 kg/m    I/O last 3 completed shifts: In: 2433.7 [I.V.:1632.5; NG/GT:495; IV Piggyback:306.3] Out: 3975 [Urine:3525; Stool:450] Total I/O In: 161.4 [I.V.:161.4] Out: 550 [Urine:400; Stool:150]  SpO2: 100 % FiO2 (%): 35 %  Estimated body mass index is 34.6 kg/m as calculated from the following:   Height as of this encounter:  5' 7.99" (1.727 m).   Weight as of this encounter: 103.2 kg.    Physical Exam Constitutional:      General: He is not in acute distress.    Appearance: He is ill-appearing.     Interventions: He is intubated.  HENT:     Head: Normocephalic and atraumatic.     Mouth/Throat:     Mouth: Mucous membranes are moist.  Eyes:     Extraocular Movements: Extraocular movements intact.     Pupils: Pupils are equal, round, and reactive to light.  Cardiovascular:     Rate and Rhythm: Normal rate. Occasional extrasystoles are present.    Pulses: Normal pulses.     Heart sounds: Normal heart sounds.  Pulmonary:     Effort: No retractions. He is intubated.     Breath sounds: Examination of the right-lower field reveals decreased breath sounds.  Examination of the left-lower field reveals decreased breath sounds. Decreased breath sounds and rhonchi present. No rales.  Abdominal:     General: Abdomen is protuberant. Bowel sounds are normal.     Palpations: Abdomen is soft.     Tenderness: There is no guarding or rebound.  Musculoskeletal:     Right lower leg: 1+ Pitting Edema present.     Left lower leg: 1+ Pitting Edema present.  Neurological:     GCS: GCS eye subscore is 2. GCS motor subscore is 4.     Comments: Sedated          ASSESSMENT AND PLAN  SYNOPSIS:  75 yo white male with acute MRSA pneumonia and MSSA bacteremia PROTEUS BACTERAMIAwith severe DKA and acidosis leading to severe and acute hypoxic and hypercapnic resp failure with failure to wean from vent and inability to protect airway with severe encephalopathy failure to wean from vent  RESPIRATORY:      Component Value Date/Time   PHART 7.48 (H) 05/23/2020 1552   PCO2ART 31 (L) 05/23/2020 1552   PO2ART 79 (L) 05/23/2020 1552   HCO3 23.1 05/23/2020 1552   ACIDBASEDEF 0.1 05/23/2020 1552   O2SAT 96.5 05/23/2020 1552   Severe ACUTE Hypoxic and Hypercapnic Respiratory Failure -continue Full MV support -continue Bronchodilator Therapy -steroids have been weaned, will discontinue -Wean Fio2 and PEEP as tolerated -VAP/VENT bundle implementation -Morbid obesity, mindful of respiratory mechanics (hx OSA now ETT)  .   -Will need TRACH and PEG for long term care  CARDIAC: -tele monitoring -EF 50%, no LVH, no LAE, No hemodynamic valvular issues -Pacemaker -No pressors at this time  RENAL: AKI/Renal Failure  Intake/Output Summary (Last 24 hours) at 05/27/2020 1146 Last data filed at 05/27/2020 0737 Gross per 24 hour  Intake 1201.08 ml  Output 3025 ml  Net -1823.92 ml  Net IO Since Admission: 6,480.42 mL [05/27/20 1146]   Lab Results  Component Value Date   CREATININE 2.68 (H) 05/27/2020   BUN 99 (H) 05/27/2020   NA 145 05/27/2020   K 3.7  05/27/2020   CL 112 (H) 05/27/2020   CO2 20 (L) 05/27/2020   -Creatinine 2.68, trending down -Continue Foley Catheter-assess need -Avoid nephrotoxic agents -Follow urine output, BMP -Ensure adequate renal perfusion, optimize oxygenation -Renal dose medications -Stout uremia persists, may lend to encephalopathy -Nephrology consultation appreciated, no plans to dialyze at this time   NEUROLOGY: Acute toxic metabolic encephalopathy Goal RASS zero but this has been challenging Switch Fentanyl to Dilaudid for tachyphylaxis Valium per tube reduce precedex  CT head 05/20/20 without acute finding EEG  INFECTIOUS DISEASE:  Lab Results  Component Value Date   WBC 9.2 05/27/2020   Temp Readings from Last 3 Encounters:  05/27/20 (!) 96.3 F (35.7 C) (Esophageal)   -Leukocytosis has regressed -Remains afebrile -ID consultation appreciated  -05/14/20 tracheal aspirate with MRSA, completed Zyvox 7/7 for suspected PNA -05/03/2020 blood culture MSSA and proteus, both sensitive to cefazolin 14/14 now -Pacemaker, TEE without vegetations or lead involvment  GI: GI PROPHYLAXIS as indicated GIB?  05/13/20 FOB+, no overt melena Hemoglobin remains stable  F/E/N: -improved hypernatremia  -hyperchloremic metabolic acidosis -TF's as tolerated -bowel protocol as indicated -follow labs as needed -replace as needed -nutrition consultation and following  ENDO: -resolved DKA, still issue with hyperglycemia -will use ICU hypoglycemic\Hyperglycemia protocol -can remove steroids at this time     ATTESTATION:  I Assessed the need for Labs I Assessed the need for Foley I Assessed the need for Central Venous Line I Assessed the need for Mobilization I made an Assessment of medications to be adjusted accordingly with pharmacy Safety Risk assessment completed  Critical Care Time devoted to patient care services described in this note is 45 minutes.  Overall, patient is critically ill,  prognosis is guarded.   Patient with Multiorgan failure and at high risk for cardiac arrest and death.  Images reviewed directly and interpretation in A&P is my own unless noted Labs reviewed and evaluated as noted in A/P  //Alyce Inscore

## 2020-05-27 NOTE — Progress Notes (Signed)
[facility]  ROUNDING NOTE   Subjective:   01/26 0701 - 01/27 0700 In: 1358.2 [I.V.:1152; IV Piggyback:206.2] Out: 9924 [Urine:2475]  UOP 2456mL.   Tracheostomy and PEG to be scheduled.   Objective:  Vital signs in last 24 hours:  Temp:  [97.7 F (36.5 C)-98.5 F (36.9 C)] 97.8 F (36.6 C) (01/27 0400) Pulse Rate:  [59-77] 63 (01/27 0600) Resp:  [13-19] 15 (01/27 0600) BP: (134-152)/(73-86) 147/79 (01/27 0500) SpO2:  [93 %-100 %] 100 % (01/27 0600) FiO2 (%):  [35 %] 35 % (01/27 0824) Weight:  [103.2 kg] 103.2 kg (01/27 0319)  Weight change: -3.6 kg Filed Weights   05/23/20 0500 05/26/20 0448 05/27/20 0319  Weight: 106.4 kg 106.8 kg 103.2 kg    Intake/Output: I/O last 3 completed shifts: In: 2433.7 [I.V.:1632.5; NG/GT:495; IV Piggyback:306.3] Out: 2683 [Urine:3525; Stool:450]   Intake/Output this shift:  Total I/O In: 161.4 [I.V.:161.4] Out: 550 [Urine:400; Stool:150]  Physical Exam: General:  Critically ill  Head:  ETT  Eyes:  Anicteric  Neck:  trachea midline  Lungs:   Pressure Control FiO2 35%  Heart:  regular  Abdomen:   Soft, obese, bowel sounds present  Extremities:  ++ peripheral edema.  Neurologic:  Intubated, sedated  Skin:  No rash  Access:  No dialysis access    Basic Metabolic Panel: Recent Labs  Lab 05/23/20 0443 05/24/20 0512 05/25/20 0536 05/26/20 0446 05/27/20 0327  NA 147* 146* 145 144 145  K 3.6 3.8 3.9 4.8 3.7  CL 112* 109 109 109 112*  CO2 $Re'23 23 22 'BLF$ 20* 20*  GLUCOSE 191* 159* 170* 311* 225*  BUN 88* 89* 97* 108* 99*  CREATININE 3.42* 3.23* 3.16* 3.17* 2.68*  CALCIUM 7.8* 8.2* 7.9* 8.0* 8.1*  MG 2.3 2.5* 2.4 2.5* 2.4  PHOS 5.9* 7.2* 8.9* 11.7* 7.1*    Liver Function Tests: No results for input(s): AST, ALT, ALKPHOS, BILITOT, PROT, ALBUMIN in the last 168 hours. No results for input(s): LIPASE, AMYLASE in the last 168 hours. Recent Labs  Lab 05/21/20 1207  AMMONIA 17    CBC: Recent Labs  Lab  05/23/20 0443 05/24/20 0512 05/25/20 0536 05/26/20 0446 05/27/20 0327  WBC 11.4* 9.9 10.9* 14.1* 9.2  NEUTROABS 8.6* 7.3 8.4* 13.1* 7.1  HGB 8.5* 8.7* 8.7* 8.7* 8.5*  HCT 28.4* 28.1* 28.3* 29.3* 26.4*  MCV 96.3 96.2 96.3 99.7 93.6  PLT 280 293 290 303 279    Cardiac Enzymes: No results for input(s): CKTOTAL, CKMB, CKMBINDEX, TROPONINI in the last 168 hours.  BNP: Invalid input(s): POCBNP  CBG: Recent Labs  Lab 05/26/20 1626 05/26/20 1932 05/26/20 2309 05/27/20 0311 05/27/20 0726  GLUCAP 280* 253* 235* 194* 178*    Microbiology: Results for orders placed or performed during the hospital encounter of 05/15/2020  Blood culture (routine single)     Status: Abnormal   Collection Time: 05/07/2020 10:24 PM   Specimen: BLOOD  Result Value Ref Range Status   Specimen Description   Final    BLOOD LEFT ASSIST CONTROL Performed at [facility], [address] [place], [place] [address]    Special Requests   Final    BOTTLES DRAWN AEROBIC AND ANAEROBIC Blood Culture adequate volume Performed at [facility], [address] [place], [place] [address]    Culture  Setup Time   Final    Organism ID to follow GRAM POSITIVE COCCI IN BOTH AEROBIC AND ANAEROBIC BOTTLES CRITICAL RESULT CALLED TO, READ BACK BY AND VERIFIED  WITH: MYRA SLAUGHTER AT 1309 ON 05/13/20 SNG GRAM NEGATIVE RODS ANAEROBIC BOTTLE ONLY CRITICAL RESULT CALLED TO, READ BACK BY AND VERIFIED WITH: MORGAN HICKS PHARMD $[email or web address]  05/14/20 EB Performed at [facility], [place] [address] [place], [place] [address]    Culture STAPHYLOCOCCUS AUREUS PROTEUS MIRABILIS  (A)  Final   Report Status 05/16/2020 FINAL  Final   Organism ID, Bacteria STAPHYLOCOCCUS AUREUS  Final   Organism ID, Bacteria PROTEUS MIRABILIS  Final      Susceptibility   Proteus mirabilis - MIC*    AMPICILLIN <=2 SENSITIVE Sensitive     CEFAZOLIN <=4 SENSITIVE Sensitive     CEFEPIME <=0.12 SENSITIVE Sensitive     CEFTAZIDIME <=1  SENSITIVE Sensitive     CEFTRIAXONE <=0.25 SENSITIVE Sensitive     CIPROFLOXACIN <=0.25 SENSITIVE Sensitive     GENTAMICIN <=1 SENSITIVE Sensitive     IMIPENEM 8 INTERMEDIATE Intermediate     TRIMETH/SULFA <=20 SENSITIVE Sensitive     AMPICILLIN/SULBACTAM <=2 SENSITIVE Sensitive     PIP/TAZO <=4 SENSITIVE Sensitive     * PROTEUS MIRABILIS   Staphylococcus aureus - MIC*    CIPROFLOXACIN <=0.5 SENSITIVE Sensitive     ERYTHROMYCIN <=0.25 SENSITIVE Sensitive     GENTAMICIN <=0.5 SENSITIVE Sensitive     OXACILLIN 0.5 SENSITIVE Sensitive     TETRACYCLINE <=1 SENSITIVE Sensitive     VANCOMYCIN 1 SENSITIVE Sensitive     TRIMETH/SULFA <=10 SENSITIVE Sensitive     CLINDAMYCIN <=0.25 SENSITIVE Sensitive     RIFAMPIN <=0.5 SENSITIVE Sensitive     Inducible Clindamycin NEGATIVE Sensitive     * STAPHYLOCOCCUS AUREUS  Culture, blood (single)     Status: None   Collection Time: 05/06/2020 10:24 PM   Specimen: BLOOD  Result Value Ref Range Status   Specimen Description BLOOD LEFT HAND  Final   Special Requests   Final    BOTTLES DRAWN AEROBIC AND ANAEROBIC Blood Culture adequate volume   Culture   Final    NO GROWTH 5 DAYS Performed at [facility], [address] [place], [place] [address]    Report Status 05/16/2020 FINAL  Final  Blood Culture ID Panel (Reflexed)     Status: Abnormal   Collection Time: 05/06/2020 10:24 PM  Result Value Ref Range Status   Enterococcus faecalis NOT DETECTED NOT DETECTED Final   Enterococcus Faecium NOT DETECTED NOT DETECTED Final   Listeria monocytogenes NOT DETECTED NOT DETECTED Final   Staphylococcus species DETECTED (A) NOT DETECTED Final    Comment: CRITICAL RESULT CALLED TO, READ BACK BY AND VERIFIED WITH: MYRA SLAUGHTER AT 1309 ON 05/13/20 SNG    Staphylococcus aureus (BCID) DETECTED (A) NOT DETECTED Final    Comment: CRITICAL RESULT CALLED TO, READ BACK BY AND VERIFIED WITH: MYRA SLAUGHTER AT 1309 05/13/20 SNG    Staphylococcus epidermidis  NOT DETECTED NOT DETECTED Final   Staphylococcus lugdunensis NOT DETECTED NOT DETECTED Final   Streptococcus species NOT DETECTED NOT DETECTED Final   Streptococcus agalactiae NOT DETECTED NOT DETECTED Final   Streptococcus pneumoniae NOT DETECTED NOT DETECTED Final   Streptococcus pyogenes NOT DETECTED NOT DETECTED Final   A.calcoaceticus-baumannii NOT DETECTED NOT DETECTED Final   Bacteroides fragilis NOT DETECTED NOT DETECTED Final   Enterobacterales NOT DETECTED NOT DETECTED Final   Enterobacter cloacae complex NOT DETECTED NOT DETECTED Final   Escherichia coli NOT DETECTED NOT DETECTED Final   Klebsiella aerogenes NOT DETECTED NOT DETECTED Final   Klebsiella oxytoca NOT DETECTED NOT DETECTED Final  Klebsiella pneumoniae NOT DETECTED NOT DETECTED Final   Proteus species NOT DETECTED NOT DETECTED Final   Salmonella species NOT DETECTED NOT DETECTED Final   Serratia marcescens NOT DETECTED NOT DETECTED Final   Haemophilus influenzae NOT DETECTED NOT DETECTED Final   Neisseria meningitidis NOT DETECTED NOT DETECTED Final   Pseudomonas aeruginosa NOT DETECTED NOT DETECTED Final   Stenotrophomonas maltophilia NOT DETECTED NOT DETECTED Final   Candida albicans NOT DETECTED NOT DETECTED Final   Candida auris NOT DETECTED NOT DETECTED Final   Candida glabrata NOT DETECTED NOT DETECTED Final   Candida krusei NOT DETECTED NOT DETECTED Final   Candida parapsilosis NOT DETECTED NOT DETECTED Final   Candida tropicalis NOT DETECTED NOT DETECTED Final   Cryptococcus neoformans/gattii NOT DETECTED NOT DETECTED Final   Meth resistant mecA/C and MREJ NOT DETECTED NOT DETECTED Final    Comment: Performed at [facility], [address], [place], [place] [address]  Urine culture     Status: None   Collection Time: 05/13/20 12:26 AM   Specimen: In/Out Cath Urine  Result Value Ref Range Status   Specimen Description   Final    IN/OUT CATH URINE Performed at [facility], [address] [place], [place] [address]    Special Requests   Final    NONE Performed at [facility], [address] [place], [place] [address]    Culture   Final    NO GROWTH Performed at [facility], [address], [place], [place] [address]    Report Status 05/14/2020 FINAL  Final  Resp Panel by RT-PCR (Flu A&B, Covid) Nasopharyngeal Swab     Status: None   Collection Time: 05/13/20 12:26 AM   Specimen: Nasopharyngeal Swab; Nasopharyngeal(NP) swabs in vial transport medium  Result Value Ref Range Status   SARS Coronavirus 2 by RT PCR NEGATIVE NEGATIVE Final    Comment: (NOTE) SARS-CoV-2 target nucleic acids are NOT DETECTED.  The SARS-CoV-2 RNA is generally detectable in upper respiratory specimens during the acute phase of infection. The lowest concentration of SARS-CoV-2 viral copies this assay can detect is 138 copies/mL. A negative result does not preclude SARS-Cov-2 infection and should not be used as the sole basis for treatment or other patient management decisions. A negative result may occur with  improper specimen collection/handling, submission of specimen other than nasopharyngeal swab, presence of viral mutation(s) within the areas targeted by this assay, and inadequate number of viral copies(<138 copies/mL). A negative result must be combined with clinical observations, patient history, and epidemiological information. The expected result is Negative.  Fact Sheet for Patients:  BloggerCourse.com  Fact Sheet for Healthcare Providers:  SeriousBroker.it  This test is no t yet approved or cleared by the [place] FDA and  has been authorized for detection and/or diagnosis of SARS-CoV-2 by FDA under an Emergency Use Authorization (EUA). This EUA will remain  in effect (meaning this test can be used) for the duration of the COVID-19 declaration under Section 564(b)(1) of the Act,  21 U.S.C.section 360bbb-3(b)(1), unless the authorization is terminated  or revoked sooner.       Influenza A by PCR NEGATIVE NEGATIVE Final   Influenza B by PCR NEGATIVE NEGATIVE Final    Comment: (NOTE) The Xpert Xpress SARS-CoV-2/FLU/RSV plus assay is intended as an aid in the diagnosis of influenza from Nasopharyngeal swab specimens and should not be used as a sole basis for treatment. Nasal washings and aspirates are unacceptable for Xpert Xpress  SARS-CoV-2/FLU/RSV testing.  Fact Sheet for Patients: EntrepreneurPulse.com.au  Fact Sheet for Healthcare Providers: IncredibleEmployment.be  This test is not yet approved or cleared by the [place] FDA and has been authorized for detection and/or diagnosis of SARS-CoV-2 by FDA under an Emergency Use Authorization (EUA). This EUA will remain in effect (meaning this test can be used) for the duration of the COVID-19 declaration under Section 564(b)(1) of the Act, 21 U.S.C. section 360bbb-3(b)(1), unless the authorization is terminated or revoked.  Performed at [facility], [address] [place], [place] [address]   Culture, blood (Routine X 2) w Reflex to ID Panel     Status: None   Collection Time: 05/14/20  2:41 PM   Specimen: BLOOD  Result Value Ref Range Status   Specimen Description BLOOD RIGHT ANTECUBITAL  Final   Special Requests   Final    BOTTLES DRAWN AEROBIC AND ANAEROBIC Blood Culture adequate volume   Culture   Final    NO GROWTH 5 DAYS Performed at [facility] [address] [place], [place] [address]    Report Status 05/19/2020 FINAL  Final  Culture, blood (Routine X 2) w Reflex to ID Panel     Status: None   Collection Time: 05/14/20  2:55 PM   Specimen: BLOOD  Result Value Ref Range Status   Specimen Description BLOOD BLOOD RIGHT HAND  Final   Special Requests   Final    BOTTLES DRAWN AEROBIC AND ANAEROBIC Blood Culture results may not be  optimal due to an excessive volume of blood received in culture bottles   Culture   Final    NO GROWTH 5 DAYS Performed at [facility], [address], [place], [place] [address]    Report Status 05/19/2020 FINAL  Final  Culture, respiratory (non-expectorated)     Status: None   Collection Time: 05/14/20  5:34 PM   Specimen: Tracheal Aspirate; Respiratory  Result Value Ref Range Status   Specimen Description   Final    TRACHEAL ASPIRATE Performed at [facility], [address] [place], [place] [address]    Special Requests   Final    NONE Performed at [facility], [address] [place], [place] [address]    Gram Stain   Final    MODERATE WBC PRESENT, PREDOMINANTLY PMN FEW GRAM POSITIVE COCCI IN CLUSTERS Performed at [facility], [place] [address], [place], [place] [address]    Culture RARE METHICILLIN RESISTANT STAPHYLOCOCCUS AUREUS  Final   Report Status 05/06/2020 FINAL  Final   Organism ID, Bacteria METHICILLIN RESISTANT STAPHYLOCOCCUS AUREUS  Final      Susceptibility   Methicillin resistant staphylococcus aureus - MIC*    CIPROFLOXACIN >=8 RESISTANT Resistant     ERYTHROMYCIN >=8 RESISTANT Resistant     GENTAMICIN <=0.5 SENSITIVE Sensitive     OXACILLIN >=4 RESISTANT Resistant     TETRACYCLINE <=1 SENSITIVE Sensitive     VANCOMYCIN <=0.5 SENSITIVE Sensitive     TRIMETH/SULFA <=10 SENSITIVE Sensitive     CLINDAMYCIN >=8 RESISTANT Resistant     RIFAMPIN <=0.5 SENSITIVE Sensitive     Inducible Clindamycin NEGATIVE Sensitive     * RARE METHICILLIN RESISTANT STAPHYLOCOCCUS AUREUS  MRSA PCR Screening     Status: Abnormal   Collection Time: 05/14/20  9:04 PM   Specimen: Nasopharyngeal  Result Value Ref Range Status   MRSA by PCR POSITIVE (A) NEGATIVE Final    Comment:  The GeneXpert MRSA Assay (FDA approved for NASAL specimens only), is one component of a comprehensive MRSA colonization surveillance program. It is  not intended to diagnose MRSA infection nor to guide or monitor treatment for MRSA infections. RESULT CALLED TO, READ BACK BY AND VERIFIED WITH: MELISSA COBB AT [phone] 05/15/20.PMF Performed at [facility], [address] [place], [place] [address]   Culture, respiratory     Status: None   Collection Time: 05/22/20 11:26 AM   Specimen: Tracheal Aspirate; Respiratory  Result Value Ref Range Status   Specimen Description   Final    TRACHEAL ASPIRATE Performed at [facility], [address] [place], [place] [address]    Special Requests   Final    NONE Performed at [facility], [address] [place], [place] [address]    Gram Stain   Final    RARE WBC PRESENT, PREDOMINANTLY PMN NO ORGANISMS SEEN    Culture   Final    NO GROWTH 2 DAYS Performed at [facility], [place] [address] [place], [place] [address]    Report Status 05/24/2020 FINAL  Final    Coagulation Studies: No results for input(s): LABPROT, INR in the last 72 hours.  Urinalysis: No results for input(s): COLORURINE, LABSPEC, PHURINE, GLUCOSEU, HGBUR, BILIRUBINUR, KETONESUR, PROTEINUR, UROBILINOGEN, NITRITE, LEUKOCYTESUR in the last 72 hours.  Invalid input(s): APPERANCEUR    Imaging: DG Chest Port 1 View  Result Date: 05/26/2020 CLINICAL DATA:  Respiratory failure.  Intubation. EXAM: PORTABLE CHEST 1 VIEW COMPARISON:  05/24/2020. FINDINGS: Endotracheal tube, NG tube, right IJ line in stable position. Cardiac pacer stable position. Cardiomegaly with mild pulmonary venous congestion. Low lung volumes with mild bibasilar atelectasis and infiltrates/edema. No pleural effusion. No pneumothorax. IMPRESSION: 1. Lines and tubes in stable position. 2. Cardiomegaly with mild pulmonary venous congestion. 3. Low lung volumes with mild bibasilar atelectasis and infiltrates/edema. Electronically Signed   By: Marcello Moores  Register   On: 05/26/2020 07:56     Medications:   . sodium chloride  Stopped (05/20/20 2308)  . dexmedetomidine (PRECEDEX) IV infusion 0.8 mcg/kg/hr (05/27/20 0737)  . feeding supplement (VITAL AF 1.2 CAL) 45 mL/hr at 05/27/20 0427  . fentaNYL infusion INTRAVENOUS 125 mcg/hr (05/27/20 0737)   . chlorhexidine gluconate (MEDLINE KIT)  15 mL Mouth Rinse BID  . Chlorhexidine Gluconate Cloth  6 each Topical Q0600  . feeding supplement (PROSource TF)  90 mL Per Tube TID  . free water  200 mL Per Tube Q4H  . insulin aspart  0-15 Units Subcutaneous Q4H  . insulin aspart  4 Units Subcutaneous Q4H  . insulin detemir  44 Units Subcutaneous Q12H  . mouth rinse  15 mL Mouth Rinse 10 times per day  . methylPREDNISolone (SOLU-MEDROL) injection  20 mg Intravenous Q12H  . multivitamin with minerals  1 tablet Per Tube Daily  . pantoprazole  40 mg Intravenous Daily  . thiamine injection  100 mg Intravenous Daily   sodium chloride, acetaminophen (TYLENOL) oral liquid 160 mg/5 mL, acetaminophen, dextrose, docusate, fentaNYL (SUBLIMAZE) injection, ipratropium-albuterol, metoprolol tartrate, midazolam, polyethylene glycol, vecuronium  Assessment/ Plan:    [name] is a 75 y.o. white male with coronary artery disease, diabetes mellitus type 2, history of AICD placement who was admitted to [facility] on 05/06/2020 for evaluation of altered mental status and DKA  1.  Acute kidney injury, baseline creatinine currently unknown. Renal ultrasound consistent with chronic kidney disease.  2. Proteinuria and glycosuria consistent with diabetic nephropathy.  3.  Hypernatremia. Free water deficit improving.  4.  Acute respiratory failure secondary to MRSA pneumonia : tracheostomy for today.  5. Sepsis with MSSA  And proteus mirabilis bacteremia. 6. Anemia of kidney failure: hemoglobin 8.5  Plan: No indication for dialysis. Kidney function improving. Nonoliguric urine output. Electrolytes at goal.  - Continue free water flushes     LOS: 15 Torah Pinnock 1/27/20228:31 AM

## 2020-05-27 NOTE — Progress Notes (Incomplete)
ID

## 2020-05-27 NOTE — Progress Notes (Signed)
Nutrition Follow-up  DOCUMENTATION CODES:   Obesity unspecified  INTERVENTION:   Increase Vital AF 1.2 Cal to 65 mL/hr (1560 mL goal daily volume) + PROSource TF 45 mL BID per tube.   Free water flushes 127ml q4 hours   Regimen provides 1952 kcal/day, 139g/day of protein, 2165 mL H2O daily.  NUTRITION DIAGNOSIS:   Inadequate oral intake related to inability to eat as evidenced by NPO status. -Ongoing.  GOAL:   Patient will meet greater than or equal to 90% of their needs -Met with TF regimen.  MONITOR:   Vent status,Labs,Weight trends,TF tolerance,I & O's  ASSESSMENT:   74 year old male with unknown PMHx admitted with AMS found to be in DKA vs HHS, also with MSSA bacteremia.  Pt remains sedated and ventilated. OGT in place. Pt tolerating tube feeds at goal rate. Plan is for trach/PEG next week. Per chart, pt is down 16lbs(6%) since admit; RD unsure what pt's UBW is. RD will adjust tube feeds in setting of weight loss. Nephrology following.   Medications reviewed and include: insulin, solu-medrol, MVI, protonix, thiamine, precedex, fentanyl  Labs reviewed: K 3.7 wnl, BUN 99(H), creat 2.68(H), P 7.1(H), Mg 2.4 wnl Hgb 8.5(L), Hct 26.4(L) cbgs- 194, 178 x 24 hrs  Patient is currently intubated on ventilator support MV: 12.0 L/min Temp (24hrs), Avg:97.7 F (36.5 C), Min:96.3 F (35.7 C), Max:98.5 F (36.9 C)  Propofol: none   MAP- > 68mmHg  UOP-2455ml   Diet Order:   Diet Order            Diet NPO time specified  Diet effective midnight                EDUCATION NEEDS:   No education needs have been identified at this time  Skin:  Skin Assessment: Reviewed RN Assessment  Last BM:  1/27- type 7- 132ml via rectal tube  Height:   Ht Readings from Last 1 Encounters:  05/25/20 5' 7.99" (1.727 m)   Weight:   Wt Readings from Last 1 Encounters:  05/27/20 103.2 kg   Ideal Body Weight:  70 kg  BMI:  Body mass index is 34.6 kg/m.  Estimated  Nutritional Needs:   Kcal:  1800-2100kcal/day (25-30kcal/kg IBW)  Protein:  140 grams (2 grams/kg IBW)  Fluid:  2 L/day  Koleen Distance MS, RD, LDN Please refer to [facility] for RD and/or RD on-call/weekend/after hours pager

## 2020-05-27 NOTE — Procedures (Signed)
Patient Name: Jurgen Groeneveld  MRN: [id number]  Epilepsy Attending: Charlsie Quest  Referring Physician/Provider: Dr Campbell Riches Date: 05/27/2020 Duration: 24.11 mins  Patient history: 75yo M with ams. EEG to evaluate for seizure  Level of alertness: comatose  AEDs during EEG study: Valium  Technical aspects: This EEG study was done with scalp electrodes positioned according to the 10-20 International system of electrode placement. Electrical activity was acquired at a sampling rate of 500Hz  and reviewed with a high frequency filter of 70Hz  and a low frequency filter of 1Hz . EEG data were recorded continuously and digitally stored.   Description: EEG showed continuous generalized 2-3hz  delta as well as 4-5hz  theta slowing. Hyperventilation and photic stimulation were not performed.     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 No seizures or epileptiform discharges were seen throughout the recording.  Larron Armor 

## 2020-05-28 DIAGNOSIS — R4182 Altered mental status, unspecified: Secondary | ICD-10-CM | POA: Diagnosis not present

## 2020-05-28 DIAGNOSIS — J9601 Acute respiratory failure with hypoxia: Secondary | ICD-10-CM | POA: Diagnosis not present

## 2020-05-28 DIAGNOSIS — R7881 Bacteremia: Secondary | ICD-10-CM | POA: Diagnosis not present

## 2020-05-28 DIAGNOSIS — J9602 Acute respiratory failure with hypercapnia: Secondary | ICD-10-CM

## 2020-05-28 DIAGNOSIS — A419 Sepsis, unspecified organism: Secondary | ICD-10-CM

## 2020-05-28 DIAGNOSIS — N179 Acute kidney failure, unspecified: Secondary | ICD-10-CM | POA: Diagnosis not present

## 2020-05-28 DIAGNOSIS — Z95810 Presence of automatic (implantable) cardiac defibrillator: Secondary | ICD-10-CM | POA: Diagnosis not present

## 2020-05-28 LAB — BASIC METABOLIC PANEL
Anion gap: 13 (ref 5–15)
BUN: 100 mg/dL — ABNORMAL HIGH (ref 8–23)
CO2: 20 mmol/L — ABNORMAL LOW (ref 22–32)
Calcium: 7.9 mg/dL — ABNORMAL LOW (ref 8.9–10.3)
Chloride: 112 mmol/L — ABNORMAL HIGH (ref 98–111)
Creatinine, Ser: 2.57 mg/dL — ABNORMAL HIGH (ref 0.61–1.24)
GFR, Estimated: 25 mL/min — ABNORMAL LOW (ref >60)
Glucose, Bld: 313 mg/dL — ABNORMAL HIGH (ref 70–99)
Potassium: 4.6 mmol/L (ref 3.5–5.1)
Sodium: 145 mmol/L (ref 135–145)

## 2020-05-28 LAB — CBC WITH DIFFERENTIAL/PLATELET
Abs Immature Granulocytes: 0.07 10*3/uL (ref 0.00–0.07)
Basophils Absolute: 0 10*3/uL (ref 0.0–0.1)
Basophils Relative: 0 %
Eosinophils Absolute: 0 10*3/uL (ref 0.0–0.5)
Eosinophils Relative: 0 %
HCT: 28.2 % — ABNORMAL LOW (ref 39.0–52.0)
Hemoglobin: 8.5 g/dL — ABNORMAL LOW (ref 13.0–17.0)
Immature Granulocytes: 1 %
Lymphocytes Relative: 7 %
Lymphs Abs: 0.7 10*3/uL (ref 0.7–4.0)
MCH: 29.2 pg (ref 26.0–34.0)
MCHC: 30.1 g/dL (ref 30.0–36.0)
MCV: 96.9 fL (ref 80.0–100.0)
Monocytes Absolute: 0.4 10*3/uL (ref 0.1–1.0)
Monocytes Relative: 4 %
Neutro Abs: 9.1 10*3/uL — ABNORMAL HIGH (ref 1.7–7.7)
Neutrophils Relative %: 88 %
Platelets: 348 10*3/uL (ref 150–400)
RBC: 2.91 MIL/uL — ABNORMAL LOW (ref 4.22–5.81)
RDW: 16.8 % — ABNORMAL HIGH (ref 11.5–15.5)
WBC: 10.3 10*3/uL (ref 4.0–10.5)
nRBC: 0 % (ref 0.0–0.2)

## 2020-05-28 LAB — GLUCOSE, CAPILLARY
Glucose-Capillary: 286 mg/dL — ABNORMAL HIGH (ref 70–99)
Glucose-Capillary: 242 mg/dL — ABNORMAL HIGH (ref 70–99)
Glucose-Capillary: 291 mg/dL — ABNORMAL HIGH (ref 70–99)
Glucose-Capillary: 270 mg/dL — ABNORMAL HIGH (ref 70–99)
Glucose-Capillary: 252 mg/dL — ABNORMAL HIGH (ref 70–99)
Glucose-Capillary: 207 mg/dL — ABNORMAL HIGH (ref 70–99)

## 2020-05-28 LAB — MAGNESIUM: Magnesium: 2.4 mg/dL (ref 1.7–2.4)

## 2020-05-28 LAB — PHOSPHORUS: Phosphorus: 7.6 mg/dL — ABNORMAL HIGH (ref 2.5–4.6)

## 2020-05-28 MED ORDER — PANTOPRAZOLE SODIUM 40 MG PO PACK
40.0000 mg | PACK | Freq: Every day | ORAL | Status: DC
  Administered 2020-05-29 – 2020-05-31 (×3): 40 mg
  Filled 2020-05-28 (×3): qty 20

## 2020-05-28 MED ORDER — INSULIN ASPART 100 UNIT/ML ~~LOC~~ SOLN
8.0000 [IU] | SUBCUTANEOUS | Status: DC
  Administered 2020-05-28 – 2020-05-31 (×18): 8 [IU] via SUBCUTANEOUS
  Filled 2020-05-28 (×15): qty 1

## 2020-05-28 MED ORDER — THIAMINE HCL 100 MG PO TABS
100.0000 mg | ORAL_TABLET | Freq: Every day | ORAL | Status: DC
  Administered 2020-05-29 – 2020-05-31 (×3): 100 mg
  Filled 2020-05-28 (×3): qty 1

## 2020-05-28 NOTE — Progress Notes (Signed)
CRITICAL CARE NOTE SUBJECTIVE:  CC  Acute respiratory failure   Patient background:  76yo male w/unknown past medical history who presented to the ED via EMS after the son found him unresponsive, face down in the kitchen, incontinent of urine. On arrival to the ED, the patient was arousable only to painful stimuli, hypothermic 48F. Per the ED, the patient son reported to them they call and check on him daily and last spoke with him yesterday. When EMS arrived, the patient was awake but became unresponsive en route to the ED. Was found to be in DKA vs HSS with a blood sugar >600 and lactate 5.8  EVENTS  05/14/20- patient with MSSA bacteremia, critially ill on mechanical ventilator. Remains on vasopressor. Insulin gtt is off today. Active GI bleed s/p PRBC transfusion. 05/15/20-patient remains hypotensive on MV. Possible EGD when more stable - spoke with GI- Dr Lacretia Nicks today. Still with edema 2+ on vasopressors.  05/16/20- patient on MV. A line not functioning will dc today. Pt in sinus rhythm remains intubated septic awaiting tee for endocarditis workup.  June 07, 2020 - TEE performed at bedside.  05/18/20 - not waking up off sedation, nephrology consulted 05/22/2020 - remains unresponsive off all sedation, secretions from ETT are heavy, will repeat tracheal aspirate. FiO2 is down to 30%, patient intermittetly biting on ETT during examination. He is rhonchorous bilaterally on auscultation. Overnight patient had non bloody vomitus and OGT feeds have been held today. UOP overnight is adequate. 1/23patient with severe neurological deficit 1/24increased WOB, on vent 1/25 severe resp distress, near cardiac arrest 1/26 severe resp failure 1/27 vent  Desynchrony, sedation changed \  CRITICAL CARE CHECKLIST    FULL CODE BLUE  Indwelling Urinary Catheter continued, requirement due to   Reason to continue Indwelling Urinary Catheter strict Intake/Output monitoring for hemodynamic instability    Central Line/ continued, requirement due to  Reason to continue [employer] Monitoring of central venous pressure or other hemodynamic parameters and poor IV access   Ventilator continued, requirement due to severe respiratory failure   Sedation Goal RASS 0 to -2, vented     Glycemic Control                                  CCM Protocol    PUD Prevention                                   CCM Prophylaxis Protocol    DVT Prevention          CCM Prophylaxis Protocol    MEDICATIONS: I have reviewed all medications and confirmed regimen as documented    CASE DISCUSSED IN MULTIDISCIPLINARY ROUNDS WITH ICU TEAM  OBJECTIVE:  VITALS:  Vent Mode: PCV FiO2 (%):  [24 %-30 %] 24 % Set Rate:  [10 bmp] 10 bmp PEEP:  [10 cmH20] 10 cmH20  BP 127/70   Pulse 62   Temp 98.42 F (36.9 C)   Resp 10   Ht 5' 7.99" (1.727 m)   Wt 103.7 kg   SpO2 100%   BMI 34.77 kg/m    I/O last 3 completed shifts: In: 1351 [I.V.:1244.8; IV Piggyback:106.2] Out: 4220 [Urine:3570; Stool:650] Total I/O In: 3402.7 [I.V.:159.1; NG/GT:3243.6] Out: 1350 [Urine:1350]  SpO2: 100 % FiO2 (%): 24 %  Estimated body mass index is 34.77 kg/m as calculated from the following:  Height as of this encounter: 5' 7.99" (1.727 m).   Weight as of this encounter: 103.7 kg.    Physical Exam Constitutional:      General: He is not in acute distress.    Appearance: He is ill-appearing.     Interventions: He is intubated.  HENT:     Head: Normocephalic and atraumatic.     Mouth/Throat:     Mouth: Mucous membranes are moist.  Eyes:     Extraocular Movements: Extraocular movements intact.     Pupils: Pupils are equal, round, and reactive to light.  Cardiovascular:     Rate and Rhythm: Normal rate. Occasional extrasystoles are present.    Pulses: Normal pulses.     Heart sounds: Normal heart sounds.  Pulmonary:     Effort: No retractions. He is intubated.     Breath sounds: Examination of the right-lower field  reveals decreased breath sounds. Examination of the left-lower field reveals decreased breath sounds. Decreased breath sounds and rhonchi present. No rales.  Abdominal:     General: Abdomen is protuberant. Bowel sounds are normal.     Palpations: Abdomen is soft.     Tenderness: There is no guarding or rebound.  Musculoskeletal:     Right lower leg: 1+ Pitting Edema present.     Left lower leg: 1+ Pitting Edema present.  Neurological:     GCS: GCS eye subscore is 2. GCS motor subscore is 4.     Comments: Sedated          ASSESSMENT AND PLAN  SYNOPSIS:  75 yo white male with acute MRSA pneumonia and MSSA bacteremia PROTEUS BACTERAMIAwith severe DKA and acidosis leading to severe and acute hypoxic and hypercapnic resp failure with failure to wean from vent and inability to protect airway with severe encephalopathy failure to wean from vent  RESPIRATORY:      Component Value Date/Time   PHART 7.48 (H) 05/23/2020 1552   PCO2ART 31 (L) 05/23/2020 1552   PO2ART 79 (L) 05/23/2020 1552   HCO3 23.1 05/23/2020 1552   ACIDBASEDEF 0.1 05/23/2020 1552   O2SAT 96.5 05/23/2020 1552   Severe ACUTE Hypoxic and Hypercapnic Respiratory Failure -continue Full MV support -continue Bronchodilator Therapy -steroids have been weaned, will discontinue -Wean Fio2 and PEEP as tolerated -VAP/VENT bundle implementation -Morbid obesity, mindful of respiratory mechanics (hx OSA now ETT)  .   -Will need TRACH and PEG for long term care  CARDIAC: -tele monitoring -EF 50%, no LVH, no LAE, No hemodynamic valvular issues -Pacemaker -No pressors at this time  RENAL: AKI/Renal Failure  Intake/Output Summary (Last 24 hours) at 05/28/2020 1750 Last data filed at 05/28/2020 1707 Gross per 24 hour  Intake 3708.18 ml  Output 2920 ml  Net 788.18 ml  Net IO Since Admission: 6,489.19 mL [05/28/20 1750]   Lab Results  Component Value Date   CREATININE 2.57 (H) 05/28/2020   BUN 100 (H) 05/28/2020   NA  145 05/28/2020   K 4.6 05/28/2020   CL 112 (H) 05/28/2020   CO2 20 (L) 05/28/2020   -Creatinine 2.57, trending down -Continue Foley Catheter-assess need -Avoid nephrotoxic agents -Follow urine output, BMP -Ensure adequate renal perfusion, optimize oxygenation -Renal dose medications -Stout uremia persists, may lend to encephalopathy -Nephrology consultation appreciated, no plans to dialyze at this time   NEUROLOGY: Acute toxic metabolic encephalopathy Goal RASS zero but this has been challenging Switch Fentanyl to Dilaudid for tachyphylaxis, decrease from 2mg  to 1mg /hr Valium per tube reduce precedex to  off CT head 05/20/20 without acute finding EEG 05/27/20 without epileptiform features MRI excluded by pacer, may consider f/u CT, however several have been done  INFECTIOUS DISEASE:  Lab Results  Component Value Date   WBC 10.3 05/28/2020   Temp Readings from Last 3 Encounters:  05/28/20 98.42 F (36.9 C)   -Leukocytosis has regressed -Remains afebrile -ID consultation appreciated  -05/14/20 tracheal aspirate with MRSA, completed Zyvox 7/7 for suspected PNA -05/10/2020 blood culture MSSA and proteus, both sensitive to cefazolin 14/14 now -Pacemaker, TEE without vegetations or lead involvment  GI: GI PROPHYLAXIS as indicated GIB?  05/13/20 FOB+, no overt melena Hemoglobin remains stable  F/E/N: -improved hypernatremia  -hyperchloremic metabolic acidosis -TF's as tolerated -bowel protocol as indicated -follow labs as needed -replace as needed -nutrition consultation and following  ENDO: -resolved DKA, still issue with hyperglycemia -will use ICU hypoglycemic\Hyperglycemia protocol -can remove steroids at this time     ATTESTATION:  I Assessed the need for Labs I Assessed the need for Foley I Assessed the need for Central Venous Line I Assessed the need for Mobilization I made an Assessment of medications to be adjusted accordingly with pharmacy Safety Risk  assessment completed  Critical Care Time devoted to patient care services described in this note is 45 minutes.  Overall, patient is critically ill, prognosis is guarded.   Patient with Multiorgan failure and at high risk for cardiac arrest and death.  Images reviewed directly and interpretation in A&P is my own unless noted Labs reviewed and evaluated as noted in A/P  //Tamora Huneke

## 2020-05-28 NOTE — Progress Notes (Signed)
PHARMACIST - PHYSICIAN COMMUNICATION  CONCERNING: IV to Oral Route Change Policy  RECOMMENDATION: This patient is receiving pantoprazole, thiamine by the intravenous route.  Based on criteria approved by the Pharmacy and Therapeutics Committee, the intravenous medication(s) is/are being converted to the equivalent oral dose form(s).   DESCRIPTION: These criteria include:  The patient is eating (either orally or via tube) and/or has been taking other orally administered medications for a least 24 hours  The patient has no evidence of active gastrointestinal bleeding or impaired GI absorption (gastrectomy, short bowel, patient on TNA or NPO).  If you have questions about this conversion, please contact the Pharmacy Department   Billy Barton, [facility] 05/28/2020 4:54 PM

## 2020-05-28 NOTE — Progress Notes (Signed)
PHARMACY CONSULT NOTE  Pharmacy Consult for Electrolyte Monitoring and Replacement   Recent Labs: Potassium (mmol/L)  Date Value  05/28/2020 4.6   Magnesium (mg/dL)  Date Value  96/28/3662 2.4   Calcium (mg/dL)  Date Value  94/76/5465 7.9 (L)   Albumin (g/dL)  Date Value  03/54/6568 1.6 (L)   Phosphorus (mg/dL)  Date Value  12/75/1700 7.6 (H)   Sodium (mmol/L)  Date Value  05/28/2020 145   Assessment: 75 year old male admitted with metabolic acidosis s/t DKA vs HHS. Code Blue called after arrival to ICU and patient intubated. Blood cultures positive for MSSA.  Pharmacy to manage electrolytes.  Nutrition: ProSource 90 mL TID + Vital 45 mL/hr Diuretics: furosemide 40 mg IV daily (d/c 1/26)  Goal of Therapy:  Electrolytes WNL  Plan:   Hypernatremia: Improved, continue free water flushes 150 mL q4h (900 mL/day)  Hyperphosphatemia: will defer management to primary team / nephrology  No electrolyte replacement warranted today  Re-check electrolytes in AM  Tressie Ellis 05/28/2020 8:54 AM

## 2020-05-28 NOTE — Progress Notes (Signed)
Date of Admission:  05/21/2020    Antibiotics 1/12-1/26/22  ID: Billy Barton is a 75 y.o. 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 chlorhexidine gluconate (MEDLINE KIT)  15 mL Mouth Rinse BID  . Chlorhexidine Gluconate Cloth  6 each Topical Q0600  . diazepam  5 mg Per Tube Q6H  . feeding supplement (PROSource TF)  45 mL Per Tube BID  . free water  150 mL Per Tube Q4H  . insulin aspart  0-20 Units Subcutaneous Q4H  . insulin aspart  8 Units Subcutaneous Q4H  . insulin detemir  44 Units Subcutaneous Q12H  . mouth rinse  15 mL Mouth Rinse 10 times per day  . methylPREDNISolone (SOLU-MEDROL) injection  20 mg Intravenous Q12H  . pantoprazole  40 mg Intravenous Daily  . thiamine injection  100 mg Intravenous Daily   Triple-lumen placed on 05/13/2020 right internal jugular Urethral catheter placed on 05/05/2020. OG tube placed on 05/27/2020. Endotracheal tube placed on 05/13/2020 Objective: Vital signs in last 24 hours: Temp:  [96.6 F (35.9 C)-98.96 F (37.2 C)] 98.42 F (36.9 C) (01/28 1300) Pulse Rate:  [61-92] 89 (01/28 1300) Resp:  [7-20] 9 (01/28 1300) BP: (115-157)/(62-76) 140/70 (01/28 1300) SpO2:  [89 %-100 %] 100 % (01/28 1300) FiO2 (%):  [28 %-30 %] 28 % (01/28 1339) Weight:  [103.7 kg] 103.7 kg (01/28 0329)  PHYSICAL EXAM:  General: Intubated and sedated Head: Normocephalic, without obvious abnormality, atraumatic. Eyes: Conjunctivae clear, anicteric sclerae. Pupils are equal ENT cannot be examined Neck: , symmetrical,   Lungs: Bilateral air entry heart: crepts  Regular rate and rhythm, no murmur, rub or gallop. Abdomen: Soft, non-tender,not distended.  Bowel sounds normal. No masses Extremities: edematous Skin: blister , breaks arms   Lab Results Recent Labs    05/27/20 0327 05/28/20 0328  WBC 9.2 10.3  HGB 8.5* 8.5*  HCT 26.4* 28.2*  NA 145 145  K 3.7 4.6  CL 112* 112*  CO2 20* 20*  BUN 99* 100*  CREATININE 2.68* 2.57*   Liver Panel No results for input(s): PROT, ALBUMIN, AST, ALT, ALKPHOS, BILITOT, BILIDIR, IBILI in the last 72 hours. Sedimentation Rate No results for input(s): ESRSEDRATE in the last 72 hours. C-Reactive Protein No results for input(s): CRP in the last 72 hours.  Microbiology:  Studies/Results: EEG  Result Date: 05/27/2020 Lora Havens, MD     05/27/2020  7:02 PM Patient Name: Billy Barton MRN: [id number] Epilepsy Attending: Lora Havens Referring Physician/Provider: Dr Tyson Alias Date: 05/27/2020 Duration: 24.11 mins Patient history: 75yo M with ams. EEG to evaluate for seizure Level of alertness: comatose AEDs during EEG study: Valium Technical aspects: This EEG study was done with scalp electrodes positioned according to the 10-20 International system of electrode placement. Electrical activity was acquired at a sampling rate of 500Hz  and reviewed with a high frequency filter of 70Hz  and a low frequency filter of 1Hz . EEG data were recorded continuously and digitally stored. Description: EEG showed continuous generalized 2-3hz  delta as well as 4-5hz  theta slowing. Hyperventilation and photic stimulation were not performed.   ABNORMALITY -Continuous slow, generalized IMPRESSION: This study is suggestive of severe diffuse encephalopathy, nonspecific  etiology. No seizures or epileptiform discharges were seen throughout the recording. Priyanka Barbra Sarks     Assessment/Plan: MSSA bacteremia and Proteus bacteremia 1 out of 4.    He has a pacemaker.  TEE was negative.  Repeat blood culture from 05/14/2020 was negative.  Because of low bioburden and TEE not having any vegetation a collective decision was  made with the cardiologist not to remove the pacemaker but treat the patient with 2 weeks of antibiotics and then follow cultures of antibiotic a week later. Completed 14 days of appropriate antibiotic on 05/26/2020.   Acute encephalopathy on admission secondary to DKA with hyponatremia.  CT of the head no acute findings.  MRI could not be done due to pacemaker Cardiac arrest leading to resuscitation and intubation  MRSA pneumonia and has been treated with 7 days of linezolid. AKI nonoliguric Anemia had melena stools but no active bleeding COVID-19 illness with severe acute hypoxic respiratory failures has been intubated.  Unable to wean him off the vent.  He is going to get tracheostomy and PEG next week.  ID will follow him remotely this weekend.  Call if needed.

## 2020-05-28 NOTE — Progress Notes (Signed)
Inpatient Diabetes Program Recommendations  AACE/ADA: New Consensus Statement on Inpatient Glycemic Control (2015)  Target Ranges:  Prepandial:   less than 140 mg/dL      Peak postprandial:   less than 180 mg/dL (1-2 hours)      Critically ill patients:  140 - 180 mg/dL   Results for Billy Barton, Billy Barton (MRN [id number]) as of 05/28/2020 07:32  Ref. Range 05/26/2020 23:09 05/27/2020 03:11 05/27/2020 07:26 05/27/2020 11:29 05/27/2020 16:14 05/27/2020 19:21  Glucose-Capillary Latest Ref Range: 70 - 99 mg/dL 643 (H)  9 units NOVOLOG  194 (H)  7 units NOVOLOG  178 (H)  7 units NOVOLOG  218 (H)  9 units NOVOLOG  44 units LEVEMIR  240 (H)  9 units NOVOLOG  245 (H)  11 units NOVOLOG  44 units LEVEMIR @9 :22pm   Results for Billy Barton, Billy Barton (MRN [place]) as of 05/28/2020 07:32  Ref. Range 05/27/2020 23:58 05/28/2020 03:18 05/28/2020 07:20  Glucose-Capillary Latest Ref Range: 70 - 99 mg/dL 05/30/2020 (H)  15 units NOVOLOG  286 (H)  15 units NOVOLOG  242 (H)      Current Orders: Levemir 44 units BID      Novolog Resistant Correction Scale/ SSI (0-20 units) Q4 hours      Novolog 4 units Q4 hours    Solumedrol 20 mg BID Tube Feeds running 65cc/hr     MD- Please consider increasing the Novolog Tube Feed Coverage to 8 units Q4 hours  HOLD if tube feeds HELD for any reason     --Will follow patient during hospitalization--  660 RN, MSN, CDE Diabetes Coordinator Inpatient Glycemic Control Team Team Pager: [phone] (8a-5p)

## 2020-05-28 NOTE — Progress Notes (Signed)
[facility]  ROUNDING NOTE   Subjective:   UOP 2434mL.   Creatinine 2.57 (2.68) Na 145  EEG yesterday. Diffuse encephalopathy.   Objective:  Vital signs in last 24 hours:  Temp:  [96.08 F (35.6 C)-98.96 F (37.2 C)] 98.42 F (36.9 C) (01/28 1100) Pulse Rate:  [60-92] 92 (01/28 1100) Resp:  [7-20] 11 (01/28 1100) BP: (115-181)/(62-87) 157/76 (01/28 1100) SpO2:  [89 %-100 %] 99 % (01/28 1100) FiO2 (%):  [28 %-30 %] 28 % (01/28 0732) Weight:  [103.7 kg] 103.7 kg (01/28 0329)  Weight change: 0.5 kg Filed Weights   05/26/20 0448 05/27/20 0319 05/28/20 0329  Weight: 106.8 kg 103.2 kg 103.7 kg    Intake/Output: I/O last 3 completed shifts: In: 1941 [I.V.:1244.8; IV Piggyback:106.2] Out: 4220 [Urine:3570; Stool:650]   Intake/Output this shift:  No intake/output data recorded.  Physical Exam: General:  Critically ill  Head:  ETT  Eyes:  Anicteric  Neck:  trachea midline  Lungs:   Pressure Control FiO2 28%  Heart:  regular  Abdomen:   Soft, obese, bowel sounds present  Extremities:  ++ peripheral edema.  Neurologic:  Intubated, sedated  Skin:  No rash  Access:  No dialysis access    Basic Metabolic Panel: Recent Labs  Lab 05/24/20 0512 05/25/20 0536 05/26/20 0446 05/27/20 0327 05/28/20 0328  NA 146* 145 144 145 145  K 3.8 3.9 4.8 3.7 4.6  CL 109 109 109 112* 112*  CO2 23 22 20* 20* 20*  GLUCOSE 159* 170* 311* 225* 313*  BUN 89* 97* 108* 99* 100*  CREATININE 3.23* 3.16* 3.17* 2.68* 2.57*  CALCIUM 8.2* 7.9* 8.0* 8.1* 7.9*  MG 2.5* 2.4 2.5* 2.4 2.4  PHOS 7.2* 8.9* 11.7* 7.1* 7.6*    Liver Function Tests: No results for input(s): AST, ALT, ALKPHOS, BILITOT, PROT, ALBUMIN in the last 168 hours. No results for input(s): LIPASE, AMYLASE in the last 168 hours. Recent Labs  Lab 05/21/20 1207  AMMONIA 17    CBC: Recent Labs  Lab 05/24/20 0512 05/25/20 0536 05/26/20 0446 05/27/20 0327 05/28/20 0328  WBC 9.9 10.9* 14.1* 9.2 10.3   NEUTROABS 7.3 8.4* 13.1* 7.1 9.1*  HGB 8.7* 8.7* 8.7* 8.5* 8.5*  HCT 28.1* 28.3* 29.3* 26.4* 28.2*  MCV 96.2 96.3 99.7 93.6 96.9  PLT 293 290 303 279 348    Cardiac Enzymes: No results for input(s): CKTOTAL, CKMB, CKMBINDEX, TROPONINI in the last 168 hours.  BNP: Invalid input(s): POCBNP  CBG: Recent Labs  Lab 05/27/20 1921 05/27/20 2358 05/28/20 0318 05/28/20 0720 05/28/20 1108  GLUCAP 245* 285* 286* 242* 291*    Microbiology: Results for orders placed or performed during the hospital encounter of 05/26/2020  Blood culture (routine single)     Status: Abnormal   Collection Time: 05/30/2020 10:24 PM   Specimen: BLOOD  Result Value Ref Range Status   Specimen Description   Final    BLOOD LEFT ASSIST CONTROL Performed at [facility], [address], [place], [place] [address]    Special Requests   Final    BOTTLES DRAWN AEROBIC AND ANAEROBIC Blood Culture adequate volume Performed at [facility], [address] [place], [place] [address]    Culture  Setup Time   Final    Organism ID to follow GRAM POSITIVE COCCI IN BOTH AEROBIC AND ANAEROBIC BOTTLES CRITICAL RESULT CALLED TO, READ BACK BY AND VERIFIED WITH: MYRA SLAUGHTER AT 1309 ON 05/13/20 SNG GRAM NEGATIVE RODS ANAEROBIC BOTTLE ONLY CRITICAL  RESULT CALLED TO, READ BACK BY AND VERIFIED WITH: MORGAN HICKS PHARMD $[email or web address]$  05/14/20 EB Performed at [facility], [place] [address], [place], [place] [address]    Culture STAPHYLOCOCCUS AUREUS PROTEUS MIRABILIS  (A)  Final   Report Status 05/16/2020 FINAL  Final   Organism ID, Bacteria STAPHYLOCOCCUS AUREUS  Final   Organism ID, Bacteria PROTEUS MIRABILIS  Final      Susceptibility   Proteus mirabilis - MIC*    AMPICILLIN <=2 SENSITIVE Sensitive     CEFAZOLIN <=4 SENSITIVE Sensitive     CEFEPIME <=0.12 SENSITIVE Sensitive     CEFTAZIDIME <=1 SENSITIVE Sensitive     CEFTRIAXONE <=0.25 SENSITIVE Sensitive     CIPROFLOXACIN <=0.25 SENSITIVE  Sensitive     GENTAMICIN <=1 SENSITIVE Sensitive     IMIPENEM 8 INTERMEDIATE Intermediate     TRIMETH/SULFA <=20 SENSITIVE Sensitive     AMPICILLIN/SULBACTAM <=2 SENSITIVE Sensitive     PIP/TAZO <=4 SENSITIVE Sensitive     * PROTEUS MIRABILIS   Staphylococcus aureus - MIC*    CIPROFLOXACIN <=0.5 SENSITIVE Sensitive     ERYTHROMYCIN <=0.25 SENSITIVE Sensitive     GENTAMICIN <=0.5 SENSITIVE Sensitive     OXACILLIN 0.5 SENSITIVE Sensitive     TETRACYCLINE <=1 SENSITIVE Sensitive     VANCOMYCIN 1 SENSITIVE Sensitive     TRIMETH/SULFA <=10 SENSITIVE Sensitive     CLINDAMYCIN <=0.25 SENSITIVE Sensitive     RIFAMPIN <=0.5 SENSITIVE Sensitive     Inducible Clindamycin NEGATIVE Sensitive     * STAPHYLOCOCCUS AUREUS  Culture, blood (single)     Status: None   Collection Time: 05/01/2020 10:24 PM   Specimen: BLOOD  Result Value Ref Range Status   Specimen Description BLOOD LEFT HAND  Final   Special Requests   Final    BOTTLES DRAWN AEROBIC AND ANAEROBIC Blood Culture adequate volume   Culture   Final    NO GROWTH 5 DAYS Performed at [facility], [address] [place], [place] [address]    Report Status 05/27/2020 FINAL  Final  Blood Culture ID Panel (Reflexed)     Status: Abnormal   Collection Time: 05/23/2020 10:24 PM  Result Value Ref Range Status   Enterococcus faecalis NOT DETECTED NOT DETECTED Final   Enterococcus Faecium NOT DETECTED NOT DETECTED Final   Listeria monocytogenes NOT DETECTED NOT DETECTED Final   Staphylococcus species DETECTED (A) NOT DETECTED Final    Comment: CRITICAL RESULT CALLED TO, READ BACK BY AND VERIFIED WITH: MYRA SLAUGHTER AT 1309 ON 05/13/20 SNG    Staphylococcus aureus (BCID) DETECTED (A) NOT DETECTED Final    Comment: CRITICAL RESULT CALLED TO, READ BACK BY AND VERIFIED WITH: MYRA SLAUGHTER AT 1309 05/13/20 SNG    Staphylococcus epidermidis NOT DETECTED NOT DETECTED Final   Staphylococcus lugdunensis NOT DETECTED NOT DETECTED Final    Streptococcus species NOT DETECTED NOT DETECTED Final   Streptococcus agalactiae NOT DETECTED NOT DETECTED Final   Streptococcus pneumoniae NOT DETECTED NOT DETECTED Final   Streptococcus pyogenes NOT DETECTED NOT DETECTED Final   A.calcoaceticus-baumannii NOT DETECTED NOT DETECTED Final   Bacteroides fragilis NOT DETECTED NOT DETECTED Final   Enterobacterales NOT DETECTED NOT DETECTED Final   Enterobacter cloacae complex NOT DETECTED NOT DETECTED Final   Escherichia coli NOT DETECTED NOT DETECTED Final   Klebsiella aerogenes NOT DETECTED NOT DETECTED Final   Klebsiella oxytoca NOT DETECTED NOT DETECTED Final   Klebsiella pneumoniae NOT DETECTED NOT DETECTED Final   Proteus species NOT DETECTED  NOT DETECTED Final   Salmonella species NOT DETECTED NOT DETECTED Final   Serratia marcescens NOT DETECTED NOT DETECTED Final   Haemophilus influenzae NOT DETECTED NOT DETECTED Final   Neisseria meningitidis NOT DETECTED NOT DETECTED Final   Pseudomonas aeruginosa NOT DETECTED NOT DETECTED Final   Stenotrophomonas maltophilia NOT DETECTED NOT DETECTED Final   Candida albicans NOT DETECTED NOT DETECTED Final   Candida auris NOT DETECTED NOT DETECTED Final   Candida glabrata NOT DETECTED NOT DETECTED Final   Candida krusei NOT DETECTED NOT DETECTED Final   Candida parapsilosis NOT DETECTED NOT DETECTED Final   Candida tropicalis NOT DETECTED NOT DETECTED Final   Cryptococcus neoformans/gattii NOT DETECTED NOT DETECTED Final   Meth resistant mecA/C and MREJ NOT DETECTED NOT DETECTED Final    Comment: Performed at [facility], [address], [place], [place] [address]  Urine culture     Status: None   Collection Time: 05/13/20 12:26 AM   Specimen: In/Out Cath Urine  Result Value Ref Range Status   Specimen Description   Final    IN/OUT CATH URINE Performed at [facility], [address], [place], [place] [address]    Special Requests   Final    NONE Performed at  [facility], [address], [place], [place] [address]    Culture   Final    NO GROWTH Performed at [facility], [place] [address] [place],  [address]    Report Status 05/14/2020 FINAL  Final  Resp Panel by RT-PCR (Flu A&B, Covid) Nasopharyngeal Swab     Status: None   Collection Time: 05/13/20 12:26 AM   Specimen: Nasopharyngeal Swab; Nasopharyngeal(NP) swabs in vial transport medium  Result Value Ref Range Status   SARS Coronavirus 2 by RT PCR NEGATIVE NEGATIVE Final    Comment: (NOTE) SARS-CoV-2 target nucleic acids are NOT DETECTED.  The SARS-CoV-2 RNA is generally detectable in upper respiratory specimens during the acute phase of infection. The lowest concentration of SARS-CoV-2 viral copies this assay can detect is 138 copies/mL. A negative result does not preclude SARS-Cov-2 infection and should not be used as the sole basis for treatment or other patient management decisions. A negative result may occur with  improper specimen collection/handling, submission of specimen other than nasopharyngeal swab, presence of viral mutation(s) within the areas targeted by this assay, and inadequate number of viral copies(<138 copies/mL). A negative result must be combined with clinical observations, patient history, and epidemiological information. The expected result is Negative.  Fact Sheet for Patients:  EntrepreneurPulse.com.au  Fact Sheet for Healthcare Providers:  IncredibleEmployment.be  This test is no t yet approved or cleared by the [place] FDA and  has been authorized for detection and/or diagnosis of SARS-CoV-2 by FDA under an Emergency Use Authorization (EUA). This EUA will remain  in effect (meaning this test can be used) for the duration of the COVID-19 declaration under Section 564(b)(1) of the Act, 21 U.S.C.section 360bbb-3(b)(1), unless the authorization is terminated  or revoked sooner.        Influenza A by PCR NEGATIVE NEGATIVE Final   Influenza B by PCR NEGATIVE NEGATIVE Final    Comment: (NOTE) The Xpert Xpress SARS-CoV-2/FLU/RSV plus assay is intended as an aid in the diagnosis of influenza from Nasopharyngeal swab specimens and should not be used as a sole basis for treatment. Nasal washings and aspirates are unacceptable for Xpert Xpress SARS-CoV-2/FLU/RSV testing.  Fact Sheet for Patients: EntrepreneurPulse.com.au  Fact Sheet for Healthcare  Providers: SeriousBroker.it  This test is not yet approved or cleared by the [place] and has been authorized for detection and/or diagnosis of SARS-CoV-2 by FDA under an Emergency Use Authorization (EUA). This EUA will remain in effect (meaning this test can be used) for the duration of the COVID-19 declaration under Section 564(b)(1) of the Act, 21 U.S.C. section 360bbb-3(b)(1), unless the authorization is terminated or revoked.  Performed at [facility], [address] [place], [place] [address]   Culture, blood (Routine X 2) w Reflex to ID Panel     Status: None   Collection Time: 05/14/20  2:41 PM   Specimen: BLOOD  Result Value Ref Range Status   Specimen Description BLOOD RIGHT ANTECUBITAL  Final   Special Requests   Final    BOTTLES DRAWN AEROBIC AND ANAEROBIC Blood Culture adequate volume   Culture   Final    NO GROWTH 5 DAYS Performed at [facility], [address] [place], [place] [address]    Report Status 05/19/2020 FINAL  Final  Culture, blood (Routine X 2) w Reflex to ID Panel     Status: None   Collection Time: 05/14/20  2:55 PM   Specimen: BLOOD  Result Value Ref Range Status   Specimen Description BLOOD BLOOD RIGHT HAND  Final   Special Requests   Final    BOTTLES DRAWN AEROBIC AND ANAEROBIC Blood Culture results may not be optimal due to an excessive volume of blood received in culture bottles   Culture   Final    NO GROWTH  5 DAYS Performed at [facility], [address], [place], [place] [address]    Report Status 05/19/2020 FINAL  Final  Culture, respiratory (non-expectorated)     Status: None   Collection Time: 05/14/20  5:34 PM   Specimen: Tracheal Aspirate; Respiratory  Result Value Ref Range Status   Specimen Description   Final    TRACHEAL ASPIRATE Performed at [facility], [address] [place], [place] [address]    Special Requests   Final    NONE Performed at [facility], [address] [place], [place] [address]    Gram Stain   Final    MODERATE WBC PRESENT, PREDOMINANTLY PMN FEW GRAM POSITIVE COCCI IN CLUSTERS Performed at [facility], [address] [place], [place] [address]    Culture RARE METHICILLIN RESISTANT STAPHYLOCOCCUS AUREUS  Final   Report Status 05/08/2020 FINAL  Final   Organism ID, Bacteria METHICILLIN RESISTANT STAPHYLOCOCCUS AUREUS  Final      Susceptibility   Methicillin resistant staphylococcus aureus - MIC*    CIPROFLOXACIN >=8 RESISTANT Resistant     ERYTHROMYCIN >=8 RESISTANT Resistant     GENTAMICIN <=0.5 SENSITIVE Sensitive     OXACILLIN >=4 RESISTANT Resistant     TETRACYCLINE <=1 SENSITIVE Sensitive     VANCOMYCIN <=0.5 SENSITIVE Sensitive     TRIMETH/SULFA <=10 SENSITIVE Sensitive     CLINDAMYCIN >=8 RESISTANT Resistant     RIFAMPIN <=0.5 SENSITIVE Sensitive     Inducible Clindamycin NEGATIVE Sensitive     * RARE METHICILLIN RESISTANT STAPHYLOCOCCUS AUREUS  MRSA PCR Screening     Status: Abnormal   Collection Time: 05/14/20  9:04 PM   Specimen: Nasopharyngeal  Result Value Ref Range Status   MRSA by PCR POSITIVE (A) NEGATIVE Final    Comment:        The GeneXpert MRSA Assay (FDA approved for NASAL specimens only), is one  component of a comprehensive MRSA colonization surveillance program. It is not intended to diagnose MRSA infection nor to guide or monitor treatment for MRSA infections. RESULT CALLED  TO, READ BACK BY AND VERIFIED WITH: MELISSA COBB AT [phone] 05/15/20.PMF Performed at [facility], [address] [place], [place] [address]   Culture, respiratory     Status: None   Collection Time: 05/22/20 11:26 AM   Specimen: Tracheal Aspirate; Respiratory  Result Value Ref Range Status   Specimen Description   Final    TRACHEAL ASPIRATE Performed at [facility], [address] [place], [place] [address]    Special Requests   Final    NONE Performed at [facility], [address] [place], [place] [address]    Gram Stain   Final    RARE WBC PRESENT, PREDOMINANTLY PMN NO ORGANISMS SEEN    Culture   Final    NO GROWTH 2 DAYS Performed at [facility], [place] [address] [place], [place] [address]    Report Status 05/24/2020 FINAL  Final    Coagulation Studies: No results for input(s): LABPROT, INR in the last 72 hours.  Urinalysis: No results for input(s): COLORURINE, LABSPEC, PHURINE, GLUCOSEU, HGBUR, BILIRUBINUR, KETONESUR, PROTEINUR, UROBILINOGEN, NITRITE, LEUKOCYTESUR in the last 72 hours.  Invalid input(s): APPERANCEUR    Imaging: EEG  Result Date: 05/27/2020 Lora Havens, MD     05/27/2020  7:02 PM Patient Name: Billy Barton MRN: [id number] Epilepsy Attending: Lora Havens Referring Physician/Provider: Dr Tyson Alias Date: 05/27/2020 Duration: 24.11 mins Patient history: 75yo M with ams. EEG to evaluate for seizure Level of alertness: comatose AEDs during EEG study: [place] Technical aspects: This EEG study was done with scalp electrodes positioned according to the 10-20 International system of electrode placement. Electrical activity was acquired at a sampling rate of 500Hz  and reviewed with a high frequency filter of 70Hz  and a low frequency filter of 1Hz . EEG data were recorded continuously and digitally stored. Description: EEG showed continuous generalized 2-3hz  delta as well as 4-5hz  theta slowing. Hyperventilation and  photic stimulation were not performed.   ABNORMALITY -Continuous slow, generalized IMPRESSION: This study is suggestive of severe diffuse encephalopathy, nonspecific etiology. No seizures or epileptiform discharges were seen throughout the recording. Priyanka Barbra Sarks     Medications:   . sodium chloride Stopped (05/20/20 2308)  . dexmedetomidine (PRECEDEX) IV infusion 0.599 mcg/kg/hr (05/28/20 1151)  . feeding supplement (VITAL AF 1.2 CAL) 1,000 mL (05/28/20 1119)  . HYDROmorphone 1 mg/hr (05/28/20 0744)   . chlorhexidine gluconate (MEDLINE KIT)  15 mL Mouth Rinse BID  . Chlorhexidine Gluconate Cloth  6 each Topical Q0600  . diazepam  5 mg Per Tube Q6H  . feeding supplement (PROSource TF)  45 mL Per Tube BID  . free water  150 mL Per Tube Q4H  . insulin aspart  0-20 Units Subcutaneous Q4H  . insulin aspart  8 Units Subcutaneous Q4H  . insulin detemir  44 Units Subcutaneous Q12H  . mouth rinse  15 mL Mouth Rinse 10 times per day  . methylPREDNISolone (SOLU-MEDROL) injection  20 mg Intravenous Q12H  . pantoprazole  40 mg Intravenous Daily  . thiamine injection  100 mg Intravenous Daily   sodium chloride, acetaminophen (TYLENOL) oral liquid 160 mg/5 mL, acetaminophen, dextrose, docusate, ipratropium-albuterol, metoprolol tartrate, midazolam, polyethylene glycol, vecuronium  Assessment/ Plan:    Mr. Loyed Wilmes is a 75 y.o. white male with coronary artery disease, diabetes mellitus type 2,  history of AICD placement who was admitted to [facility] on 05/08/2020 for DKA, type 2 ([place]) [E11.10] Altered mental status, unspecified altered mental status type [R41.82] Gastrointestinal hemorrhage, unspecified gastrointestinal hemorrhage type [K92.2] Sepsis, due to unspecified organism, unspecified whether acute organ dysfunction present ([place]) [A41.9] Hyperosmolar hyperglycemic state (HHS) ([place]) [E11.00, E11.65]  Patient found to have MSSA bacteremia and diabetic ketoacisosis. Patient required PRBC  transfusions and was found to have a GI bleed. Placed on IV pantoprazole and endoscopy deferred. Patient was in atrial flutter. He has an AICD. He converted to sinus rhythm. TEE was performed on 1/17 with no vegetations. Weaned off vasopressors. Nephrology consulted for acute renal failure. Admission creatinine of 4.34. There is no baseline creatinine available. Nonoliguric urine output and did not require renal replacement therapy. But patient developed free water deficit with hypernatremia from loop diuretics. He remains volume overloaded but furosemide held and he is getting free water flushes. He is not waking up off sedation. EEG yesterday.   1.  Acute kidney injury, baseline creatinine currently unknown. Renal ultrasound consistent with chronic kidney disease.  Proteinuria and glycosuria consistent with diabetic nephropathy.  Nonoliguric urine output.  Creatinine trending down. No indication for dialysis.   3.  Hypernatremia. Free water deficit improving. Today calculated at 0.8 liters.  - Continue free water flushes   4.  Acute respiratory failure secondary to MRSA pneumonia : tracheostomy will be required for long term care.   5. Sepsis with MSSA and proteus mirabilis bacteremia. - Appreciate ID input. Completed antibiotics.   6. Anemia of kidney failure: hemoglobin 8.5 - stable. No more melana reported. Normocytic.    LOS: [place] 1/28/202211:56 AM

## 2020-05-29 DIAGNOSIS — N179 Acute kidney failure, unspecified: Secondary | ICD-10-CM | POA: Diagnosis not present

## 2020-05-29 DIAGNOSIS — J9601 Acute respiratory failure with hypoxia: Secondary | ICD-10-CM | POA: Diagnosis not present

## 2020-05-29 DIAGNOSIS — Z95810 Presence of automatic (implantable) cardiac defibrillator: Secondary | ICD-10-CM | POA: Diagnosis not present

## 2020-05-29 DIAGNOSIS — R4182 Altered mental status, unspecified: Secondary | ICD-10-CM | POA: Diagnosis not present

## 2020-05-29 LAB — CBC WITH DIFFERENTIAL/PLATELET
Abs Immature Granulocytes: 0.08 K/uL — ABNORMAL HIGH (ref 0.00–0.07)
Basophils Absolute: 0 K/uL (ref 0.0–0.1)
Basophils Relative: 0 %
Eosinophils Absolute: 0 K/uL (ref 0.0–0.5)
Eosinophils Relative: 0 %
HCT: 27 % — ABNORMAL LOW (ref 39.0–52.0)
Hemoglobin: 8.2 g/dL — ABNORMAL LOW (ref 13.0–17.0)
Immature Granulocytes: 1 %
Lymphocytes Relative: 11 %
Lymphs Abs: 1 K/uL (ref 0.7–4.0)
MCH: 29.4 pg (ref 26.0–34.0)
MCHC: 30.4 g/dL (ref 30.0–36.0)
MCV: 96.8 fL (ref 80.0–100.0)
Monocytes Absolute: 0.5 K/uL (ref 0.1–1.0)
Monocytes Relative: 5 %
Neutro Abs: 7.2 K/uL (ref 1.7–7.7)
Neutrophils Relative %: 83 %
Platelets: 333 K/uL (ref 150–400)
RBC: 2.79 MIL/uL — ABNORMAL LOW (ref 4.22–5.81)
RDW: 16.8 % — ABNORMAL HIGH (ref 11.5–15.5)
WBC: 8.7 K/uL (ref 4.0–10.5)
nRBC: 0 % (ref 0.0–0.2)

## 2020-05-29 LAB — BASIC METABOLIC PANEL
Anion gap: 10 (ref 5–15)
BUN: 111 mg/dL — ABNORMAL HIGH (ref 8–23)
CO2: 21 mmol/L — ABNORMAL LOW (ref 22–32)
Calcium: 8.3 mg/dL — ABNORMAL LOW (ref 8.9–10.3)
Chloride: 116 mmol/L — ABNORMAL HIGH (ref 98–111)
Creatinine, Ser: 2.52 mg/dL — ABNORMAL HIGH (ref 0.61–1.24)
GFR, Estimated: 26 mL/min — ABNORMAL LOW (ref >60)
Glucose, Bld: 248 mg/dL — ABNORMAL HIGH (ref 70–99)
Potassium: 4.4 mmol/L (ref 3.5–5.1)
Sodium: 147 mmol/L — ABNORMAL HIGH (ref 135–145)

## 2020-05-29 LAB — PHOSPHORUS: Phosphorus: 5.7 mg/dL — ABNORMAL HIGH (ref 2.5–4.6)

## 2020-05-29 LAB — GLUCOSE, CAPILLARY
Glucose-Capillary: 207 mg/dL — ABNORMAL HIGH (ref 70–99)
Glucose-Capillary: 215 mg/dL — ABNORMAL HIGH (ref 70–99)
Glucose-Capillary: 222 mg/dL — ABNORMAL HIGH (ref 70–99)
Glucose-Capillary: 242 mg/dL — ABNORMAL HIGH (ref 70–99)
Glucose-Capillary: 245 mg/dL — ABNORMAL HIGH (ref 70–99)
Glucose-Capillary: 282 mg/dL — ABNORMAL HIGH (ref 70–99)

## 2020-05-29 LAB — MAGNESIUM: Magnesium: 2.6 mg/dL — ABNORMAL HIGH (ref 1.7–2.4)

## 2020-05-29 MED ORDER — FREE WATER
250.0000 mL | Status: DC
  Administered 2020-05-29 – 2020-05-30 (×6): 250 mL

## 2020-05-29 MED ORDER — DIAZEPAM 5 MG PO TABS
5.0000 mg | ORAL_TABLET | Freq: Three times a day (TID) | ORAL | Status: DC
  Administered 2020-05-29 – 2020-05-31 (×7): 5 mg
  Filled 2020-05-29 (×7): qty 1

## 2020-05-29 NOTE — Progress Notes (Signed)
Billy Barton  MRN: [id number]  DOB/AGE: Sep 08, 1945 75 y.o.  Primary Care Physician:Patient, No Pcp Per  Admit date: 05/04/2020  Chief Complaint:  Chief Complaint  Patient presents with  . Altered Mental Status    S-Pt presented on  05/18/2020 with  Chief Complaint  Patient presents with  . Altered Mental Status  . Patient remains intubated, unable to offer any complaints  Medications . chlorhexidine gluconate (MEDLINE KIT)  15 mL Mouth Rinse BID  . Chlorhexidine Gluconate Cloth  6 each Topical Q0600  . diazepam  5 mg Per Tube Q8H  . feeding supplement (PROSource TF)  45 mL Per Tube BID  . free water  250 mL Per Tube Q4H  . insulin aspart  0-20 Units Subcutaneous Q4H  . insulin aspart  8 Units Subcutaneous Q4H  . insulin detemir  44 Units Subcutaneous Q12H  . mouth rinse  15 mL Mouth Rinse 10 times per day  . pantoprazole sodium  40 mg Per Tube Daily  . thiamine  100 mg Per Tube Daily         ROS: Unable to get any data Physical Exam: Vital signs in last 24 hours: Temp:  [96.8 F (36 C)-99.14 F (37.3 C)] 99.14 F (37.3 C) (01/29 1800) Pulse Rate:  [58-98] 83 (01/29 1800) Resp:  [8-16] 9 (01/29 1800) BP: (141-198)/(73-91) 155/83 (01/29 1800) SpO2:  [91 %-98 %] 94 % (01/29 1913) FiO2 (%):  [24 %] 24 % (01/29 1913) Weight:  [103 kg] 103 kg (01/29 0314) Weight change: -0.7 kg Last BM Date: 05/29/20  Intake/Output from previous day: 01/28 0701 - 01/29 0700 In: 3623.2 [I.V.:379.6; NG/GT:3243.6] Out: 2300 [Urine:2300] No intake/output data recorded.   Physical Exam:  General- pt is intubated  HEENT-ET tube in situ, trachea is midline  Resp-bilateral breath sounds present, rhonchi present.  CVS- S1S2 regular in rate and rhythm  GIT- BS+, soft, Non tender , Non distended  EXT- 2+ LE Edema,  No Cyanosis    Lab Results:  CBC  Recent Labs    05/28/20 0328 05/29/20 0343  WBC 10.3 8.7  HGB 8.5* 8.2*  HCT 28.2* 27.0*  PLT 348 333     BMET  Recent Labs    05/28/20 0328 05/29/20 0343  NA 145 147*  K 4.6 4.4  CL 112* 116*  CO2 20* 21*  GLUCOSE 313* 248*  BUN 100* 111*  CREATININE 2.57* 2.52*  CALCIUM 7.9* 8.3*      Most recent Creatinine trend  Lab Results  Component Value Date   CREATININE 2.52 (H) 05/29/2020   CREATININE 2.57 (H) 05/28/2020   CREATININE 2.68 (H) 05/27/2020      MICRO   Recent Results (from the past 240 hour(s))  Culture, respiratory     Status: None   Collection Time: 05/22/20 11:26 AM   Specimen: Tracheal Aspirate; Respiratory  Result Value Ref Range Status   Specimen Description   Final    TRACHEAL ASPIRATE Performed at [facility], [address] [place], [place] [address]    Special Requests   Final    NONE Performed at [facility], [address] [place], [place] [address]    Gram Stain   Final    RARE WBC PRESENT, PREDOMINANTLY PMN NO ORGANISMS SEEN    Culture   Final    NO GROWTH 2 DAYS Performed at [facility], [place] [address] [place], [place] [address]    Report Status 05/24/2020 FINAL  Final  Impression:  [name] is a 75 year old Caucasian male with a past medical history of  coronary artery disease, diabetes mellitus type 2, history of AICD placementwho was admitted to [facility] on 1/12/2022for DKA, type 2 ([place]) [E11.10] Altered mental status, unspecified altered mental status type [R41.82] Gastrointestinal hemorrhage, unspecified gastrointestinal hemorrhage type [K92.2] Sepsis, due to unspecified organism, unspecified whether acute organ dysfunction present ([place]) [A41.9] Hyperosmolar hyperglycemic state (HHS) ([place]) [E11.00, E11.65]  Patient found to have MSSA bacteremia and diabetic ketoacisosis. Patient was also found to have GI bleed and required PRBC transfusions . Placed on IV PPI/pantoprazole and endoscopy deferred. Patient was in atrial flutter. He has an AICD. He converted to sinus rhythm.   Patient later had TEE performed on 1/17 with no vegetations.  Patient was weaned off vasopressors. Nephrology consulted for acute renal failure.  Patient at the time of admission had creatinine of 4.34. There is no baseline creatinine available.  Patient had nonoliguric ATN/adequate urine output and did not require renal replacement therapy. But patient developed free water deficit with hypernatremia from loop diuretics. He remains volume overloaded but furosemide held and he is getting free water flushes.  Patient isnot waking up off sedation. EEG yesterday.    1)Renal    AKI secondary to ATN Patient most likely has AKI on CKD Patient CKD stage/duration is unsure as there is no previous data available Patient has CKD most likely from diabetes mellitus  Creatinine trend 2022 4.3==>5.0=>2.5   2)HTN    Blood pressure is stable for the acute state   3)Anemia of chronic disease  CBC Latest Ref Rng & Units 05/29/2020 05/28/2020 05/27/2020  WBC 4.0 - 10.5 K/uL 8.7 10.3 9.2  Hemoglobin 13.0 - 17.0 g/dL 8.2(L) 8.5(L) 8.5(L)  Hematocrit 39.0 - 52.0 % 27.0(L) 28.2(L) 26.4(L)  Platelets 150 - 400 K/uL 333 348 279       HGb is not at goal (9--11) Patient hemoglobin stable.  Patient hemoglobin was 6.8 on July 13, patient required PRBC Patient hemoglobin is now stable at 8.2   4) Secondary hyperparathyroidism -CKD Mineral-Bone Disorder    Lab Results  Component Value Date   PTH 44 05/18/2020   CALCIUM 8.3 (L) 05/29/2020   PHOS 5.7 (H) 05/29/2020    Secondary Hyperparathyroidism present Phosphorus is not at goal. We will recheck patient phosphorus level, will hold off on binders for now  5) sepsis Patient had sepsis from MSSA and Proteus mirabilis bacteremia ID/primary team is following Patient is now clinically better  6) Electrolytes   BMP Latest Ref Rng & Units 05/29/2020 05/28/2020 05/27/2020  Glucose 70 - 99 mg/dL 248(H) 313(H) 225(H)  BUN 8 - 23 mg/dL 111(H) 100(H) 99(H)   Creatinine 0.61 - 1.24 mg/dL 2.52(H) 2.57(H) 2.68(H)  Sodium 135 - 145 mmol/L 147(H) 145 145  Potassium 3.5 - 5.1 mmol/L 4.4 4.6 3.7  Chloride 98 - 111 mmol/L 116(H) 112(H) 112(H)  CO2 22 - 32 mmol/L 21(L) 20(L) 20(L)  Calcium 8.9 - 10.3 mg/dL 8.3(L) 7.9(L) 8.1(L)     Sodium Hypernatremia Patient had a sodium level of 156 on May 17, patient sodium is now better We will continue with free water flushes Patient is on 150 mL free water flushes, will increase it to 250  Potassium Normokalemic    7)Acid base    Co2 is just at at goal     Plan:   We will increase the free water intake We will follow Chem-7 No need for renal placement therapy  Jayce Boyko s Theador Hawthorne 05/29/2020, 8:46 PM

## 2020-05-29 NOTE — Progress Notes (Signed)
PHARMACY CONSULT NOTE  Pharmacy Consult for Electrolyte Monitoring and Replacement   Recent Labs: Potassium (mmol/L)  Date Value  05/29/2020 4.4   Magnesium (mg/dL)  Date Value  16/01/9603 2.6 (H)   Calcium (mg/dL)  Date Value  54/12/8117 8.3 (L)   Albumin (g/dL)  Date Value  14/78/2956 1.6 (L)   Phosphorus (mg/dL)  Date Value  21/30/8657 5.7 (H)   Sodium (mmol/L)  Date Value  05/29/2020 147 (H)   Assessment: 75 year old male admitted with metabolic acidosis s/t DKA vs HHS. Code Blue called after arrival to ICU and patient intubated. Blood cultures positive for MSSA.  Pharmacy to manage electrolytes.  Nutrition: ProSource 90 mL TID + Vital 45 mL/hr Diuretics: furosemide 40 mg IV daily (d/c 1/26)  Goal of Therapy:  Electrolytes WNL  Plan:   Hypernatremia: continue free water flushes 150 mL q4h (900 mL/day)  No electrolyte replacement warranted today  Re-check electrolytes in AM  Pricilla Riffle, PharmD 05/29/2020 8:01 AM

## 2020-05-29 NOTE — Progress Notes (Signed)
CRITICAL CARE NOTE SUBJECTIVE:  CC  Acute respiratory failure   Patient background:  75yo male w/unknown past medical history who presented to the ED via EMS after the son found him unresponsive, face down in the kitchen, incontinent of urine. On arrival to the ED, the patient was arousable only to painful stimuli, hypothermic 75F. Per the ED, the patient son reported to them they call and check on him daily and last spoke with him yesterday. When EMS arrived, the patient was awake but became unresponsive en route to the ED. Was found to be in DKA vs HSS with a blood sugar >600 and lactate 5.8  EVENTS  05/14/20- patient with MSSA bacteremia, critially ill on mechanical ventilator. Remains on vasopressor. Insulin gtt is off today. Active GI bleed s/p PRBC transfusion. 05/15/20-patient remains hypotensive on MV. Possible EGD when more stable - spoke with GI- Dr Lacretia Nicks today. Still with edema 2+ on vasopressors.  05/16/20- patient on MV. A line not functioning will dc today. Pt in sinus rhythm remains intubated septic awaiting tee for endocarditis workup.  June 04, 2020 - TEE performed at bedside.  05/18/20 - not waking up off sedation, nephrology consulted 05/22/2020 - remains unresponsive off all sedation, secretions from ETT are heavy, will repeat tracheal aspirate. FiO2 is down to 30%, patient intermittetly biting on ETT during examination. He is rhonchorous bilaterally on auscultation. Overnight patient had non bloody vomitus and OGT feeds have been held today. UOP overnight is adequate. 1/23patient with severe neurological deficit 1/24increased WOB, on vent 1/25 severe resp distress, near cardiac arrest 1/26 severe resp failure 1/27 vent  Desynchrony, sedation changed \  CRITICAL CARE CHECKLIST    FULL CODE BLUE  Indwelling Urinary Catheter continued, requirement due to   Reason to continue Indwelling Urinary Catheter strict Intake/Output monitoring for hemodynamic instability    Central Line/ continued, requirement due to  Reason to continue [employer] Monitoring of central venous pressure or other hemodynamic parameters and poor IV access   Ventilator continued, requirement due to severe respiratory failure   Sedation Goal RASS 0 to -2, vented     Glycemic Control                                  CCM Protocol    PUD Prevention                                   CCM Prophylaxis Protocol    DVT Prevention          CCM Prophylaxis Protocol    MEDICATIONS: I have reviewed all medications and confirmed regimen as documented    CASE DISCUSSED IN MULTIDISCIPLINARY ROUNDS WITH ICU TEAM  OBJECTIVE:  VITALS:  Vent Mode: PCV FiO2 (%):  [24 %-28 %] 24 % Set Rate:  [10 bmp] 10 bmp PEEP:  [10 cmH20] 10 cmH20 Plateau Pressure:  [14 cmH20] 14 cmH20  BP (!) 178/91   Pulse 95   Temp 98.6 F (37 C)   Resp 12   Ht 5' 7.99" (1.727 m)   Wt 103 kg   SpO2 93%   BMI 34.53 kg/m    I/O last 3 completed shifts: In: 3928.7 [I.V.:685.1; NG/GT:3243.6] Out: 3370 [Urine:3070; Stool:300] Total I/O In: 83.1 [I.V.:83.1] Out: -   SpO2: 93 % FiO2 (%): 24 %  Estimated body mass index is 34.53 kg/m as  calculated from the following:   Height as of this encounter: 5' 7.99" (1.727 m).   Weight as of this encounter: 103 kg.    Physical Exam Constitutional:      General: He is not in acute distress.    Appearance: He is ill-appearing.     Interventions: He is intubated.  HENT:     Head: Normocephalic and atraumatic.     Mouth/Throat:     Mouth: Mucous membranes are moist.  Eyes:     Extraocular Movements: Extraocular movements intact.     Pupils: Pupils are equal, round, and reactive to light.  Cardiovascular:     Rate and Rhythm: Normal rate. Occasional extrasystoles are present.    Pulses: Normal pulses.     Heart sounds: Normal heart sounds.  Pulmonary:     Effort: No retractions. He is intubated.     Breath sounds: Examination of the right-lower field  reveals decreased breath sounds. Examination of the left-lower field reveals decreased breath sounds. Decreased breath sounds and rhonchi present. No rales.  Abdominal:     General: Abdomen is protuberant. Bowel sounds are normal.     Palpations: Abdomen is soft.     Tenderness: There is no guarding or rebound.  Musculoskeletal:     Right lower leg: 1+ Pitting Edema present.     Left lower leg: 1+ Pitting Edema present.  Neurological:     GCS: GCS eye subscore is 2. GCS motor subscore is 4.     Comments: Sedated          ASSESSMENT AND PLAN  SYNOPSIS:  75 yo white male with acute MRSA pneumonia and MSSA bacteremia PROTEUS BACTERAMIAwith severe DKA and acidosis leading to severe and acute hypoxic and hypercapnic resp failure with failure to wean from vent and inability to protect airway with severe encephalopathy failure to wean from vent  RESPIRATORY:      Component Value Date/Time   PHART 7.48 (H) 05/23/2020 1552   PCO2ART 31 (L) 05/23/2020 1552   PO2ART 79 (L) 05/23/2020 1552   HCO3 23.1 05/23/2020 1552   ACIDBASEDEF 0.1 05/23/2020 1552   O2SAT 96.5 05/23/2020 1552   Severe ACUTE Hypoxic and Hypercapnic Respiratory Failure -continue Full MV support -continue Bronchodilator Therapy -steroids have been weaned, will discontinue -Wean Fio2 and PEEP as tolerated -VAP/VENT bundle implementation -Morbid obesity, mindful of respiratory mechanics (hx OSA now ETT)  -essentially unchanged  .   -Will need TRACH and PEG for long term care  CARDIAC: -tele monitoring -EF 50%, no LVH, no LAE, No hemodynamic valvular issues -Pacemaker -No pressors at this time  RENAL: AKI/Renal Failure  Intake/Output Summary (Last 24 hours) at 05/29/2020 1112 Last data filed at 05/29/2020 1000 Gross per 24 hour  Intake 3706.33 ml  Output 2300 ml  Net 1406.33 ml  Net IO Since Admission: 5,842.84 mL [05/29/20 1112]   Lab Results  Component Value Date   CREATININE 2.52 (H) 05/29/2020    BUN 111 (H) 05/29/2020   NA 147 (H) 05/29/2020   K 4.4 05/29/2020   CL 116 (H) 05/29/2020   CO2 21 (L) 05/29/2020   -Creatinine 2.52, trending down -Continue Foley Catheter-assess need -Avoid nephrotoxic agents -Follow urine output, BMP -Ensure adequate renal perfusion, optimize oxygenation -Renal dose medications -Stout uremia persists, may lend to encephalopathy -Need volume reduction -Uremia may impact mental status -Nephrology consultation appreciated, no plans to dialyze at this time   NEUROLOGY: Acute toxic metabolic encephalopathy Goal RASS zero but this has been  challenging Switch Fentanyl to Dilaudid for tachyphylaxis, decrease from 2mg  to 1mg /hr Valium per tube reduce precedex to off CT head 05/20/20 without acute finding EEG 05/27/20 without epileptiform features MRI excluded by pacer, may consider f/u CT, however several have been done  INFECTIOUS DISEASE:  Lab Results  Component Value Date   WBC 8.7 05/29/2020   Temp Readings from Last 3 Encounters:  05/29/20 98.6 F (37 C)   -Leukocytosis has regressed -Remains afebrile -ID consultation appreciated  -05/14/20 tracheal aspirate with MRSA, completed Zyvox 7/7 for suspected PNA -05/23/2020 blood culture MSSA and proteus, both sensitive to cefazolin 14/14 now -Pacemaker, TEE without vegetations or lead involvment  GI: GI PROPHYLAXIS as indicated GIB?  05/13/20 FOB+, no overt melena Hemoglobin remains stable  F/E/N: -improved hypernatremia  -hyperchloremic metabolic acidosis -TF's as tolerated -bowel protocol as indicated -follow labs as needed -replace as needed -nutrition consultation and following  ENDO: -resolved DKA, still issue with hyperglycemia -will use ICU hypoglycemic\Hyperglycemia protocol -can remove steroids at this time     ATTESTATION:  I Assessed the need for Labs I Assessed the need for Foley I Assessed the need for Central Venous Line I Assessed the need for Mobilization I  made an Assessment of medications to be adjusted accordingly with pharmacy Safety Risk assessment completed  Critical Care Time devoted to patient care services described in this note is 45 minutes.  Overall, patient is critically ill, prognosis is guarded.   Patient with Multiorgan failure and at high risk for cardiac arrest and death.  Images reviewed directly and interpretation in A&P is my own unless noted Labs reviewed and evaluated as noted in A/P  //Lexany Belknap

## 2020-05-30 DIAGNOSIS — R4020 Unspecified coma: Secondary | ICD-10-CM

## 2020-05-30 DIAGNOSIS — N179 Acute kidney failure, unspecified: Secondary | ICD-10-CM | POA: Diagnosis not present

## 2020-05-30 DIAGNOSIS — J9601 Acute respiratory failure with hypoxia: Secondary | ICD-10-CM | POA: Diagnosis not present

## 2020-05-30 LAB — CBC WITH DIFFERENTIAL/PLATELET
Abs Immature Granulocytes: 0.2 10*3/uL — ABNORMAL HIGH (ref 0.00–0.07)
Basophils Absolute: 0 10*3/uL (ref 0.0–0.1)
Basophils Relative: 0 %
Eosinophils Absolute: 0.1 10*3/uL (ref 0.0–0.5)
Eosinophils Relative: 1 %
HCT: 30 % — ABNORMAL LOW (ref 39.0–52.0)
Hemoglobin: 9.1 g/dL — ABNORMAL LOW (ref 13.0–17.0)
Immature Granulocytes: 2 %
Lymphocytes Relative: 21 %
Lymphs Abs: 2.4 10*3/uL (ref 0.7–4.0)
MCH: 29.4 pg (ref 26.0–34.0)
MCHC: 30.3 g/dL (ref 30.0–36.0)
MCV: 96.8 fL (ref 80.0–100.0)
Monocytes Absolute: 1 10*3/uL (ref 0.1–1.0)
Monocytes Relative: 8 %
Neutro Abs: 8.2 10*3/uL — ABNORMAL HIGH (ref 1.7–7.7)
Neutrophils Relative %: 68 %
Platelets: 377 10*3/uL (ref 150–400)
RBC: 3.1 MIL/uL — ABNORMAL LOW (ref 4.22–5.81)
RDW: 17.4 % — ABNORMAL HIGH (ref 11.5–15.5)
WBC: 11.9 10*3/uL — ABNORMAL HIGH (ref 4.0–10.5)
nRBC: 0 % (ref 0.0–0.2)

## 2020-05-30 LAB — GLUCOSE, CAPILLARY
Glucose-Capillary: 110 mg/dL — ABNORMAL HIGH (ref 70–99)
Glucose-Capillary: 119 mg/dL — ABNORMAL HIGH (ref 70–99)
Glucose-Capillary: 144 mg/dL — ABNORMAL HIGH (ref 70–99)
Glucose-Capillary: 167 mg/dL — ABNORMAL HIGH (ref 70–99)
Glucose-Capillary: 180 mg/dL — ABNORMAL HIGH (ref 70–99)
Glucose-Capillary: 189 mg/dL — ABNORMAL HIGH (ref 70–99)

## 2020-05-30 LAB — BASIC METABOLIC PANEL
Anion gap: 12 (ref 5–15)
BUN: 107 mg/dL — ABNORMAL HIGH (ref 8–23)
CO2: 19 mmol/L — ABNORMAL LOW (ref 22–32)
Calcium: 8.6 mg/dL — ABNORMAL LOW (ref 8.9–10.3)
Chloride: 116 mmol/L — ABNORMAL HIGH (ref 98–111)
Creatinine, Ser: 2.58 mg/dL — ABNORMAL HIGH (ref 0.61–1.24)
GFR, Estimated: 25 mL/min — ABNORMAL LOW (ref >60)
Glucose, Bld: 200 mg/dL — ABNORMAL HIGH (ref 70–99)
Potassium: 3.6 mmol/L (ref 3.5–5.1)
Sodium: 147 mmol/L — ABNORMAL HIGH (ref 135–145)

## 2020-05-30 LAB — MAGNESIUM: Magnesium: 2.4 mg/dL (ref 1.7–2.4)

## 2020-05-30 LAB — LACTIC ACID, PLASMA: Lactic Acid, Venous: 0.7 mmol/L (ref 0.5–1.9)

## 2020-05-30 LAB — PHOSPHORUS: Phosphorus: 4.4 mg/dL (ref 2.5–4.6)

## 2020-05-30 MED ORDER — METOPROLOL TARTRATE 5 MG/5ML IV SOLN: 2.5000 mg | INTRAVENOUS | Status: DC | PRN

## 2020-05-30 MED ORDER — FREE WATER
300.0000 mL | Status: DC
  Administered 2020-05-30 – 2020-05-31 (×6): 300 mL

## 2020-05-30 MED ORDER — FUROSEMIDE 10 MG/ML IJ SOLN
40.0000 mg | Freq: Once | INTRAMUSCULAR | Status: AC
  Administered 2020-05-30: 40 mg via INTRAVENOUS
  Filled 2020-05-30: qty 4

## 2020-05-30 MED ORDER — PROPOFOL 1000 MG/100ML IV EMUL
5.0000 ug/kg/min | INTRAVENOUS | Status: DC
  Administered 2020-05-30: 40 ug/kg/min via INTRAVENOUS
  Administered 2020-05-31 (×2): 30 ug/kg/min via INTRAVENOUS
  Administered 2020-05-31: 40 ug/kg/min via INTRAVENOUS
  Administered 2020-05-31: 30 ug/kg/min via INTRAVENOUS
  Filled 2020-05-30 (×5): qty 100

## 2020-05-30 MED ORDER — HYDRALAZINE HCL 20 MG/ML IJ SOLN
10.0000 mg | INTRAMUSCULAR | Status: DC | PRN
  Administered 2020-05-30 (×2): 10 mg via INTRAVENOUS
  Filled 2020-05-30 (×2): qty 1

## 2020-05-30 MED ORDER — PROPOFOL 1000 MG/100ML IV EMUL
INTRAVENOUS | Status: AC
  Administered 2020-05-30: 40 ug/kg/min via INTRAVENOUS
  Filled 2020-05-30: qty 100

## 2020-05-30 NOTE — Plan of Care (Signed)
  Problem: Education: Goal: Knowledge of General Education information will improve Description: Including pain rating scale, medication(s)/side effects and non-pharmacologic comfort measures Outcome: Not Progressing   Problem: Health Behavior/Discharge Planning: Goal: Ability to manage health-related needs will improve Outcome: Not Progressing   Problem: Clinical Measurements: Goal: Ability to maintain clinical measurements within normal limits will improve Outcome: Not Progressing   Problem: Activity: Goal: Risk for activity intolerance will decrease Outcome: Not Progressing   Problem: Nutrition: Goal: Adequate nutrition will be maintained Outcome: Not Progressing   Problem: Coping: Goal: Level of anxiety will decrease Outcome: Not Progressing   Problem: Elimination: Goal: Will not experience complications related to bowel motility Outcome: Not Progressing  Pt on full vent support, easily agitated and increase in BP, PRN versed given x2 times for agitation. PRN BP medication given for SBP >160's , afebrile, cont on precedex and Dilaudid gtt.

## 2020-05-30 NOTE — Progress Notes (Addendum)
CRITICAL CARE NOTE SUBJECTIVE:  CC  Acute respiratory failure   Patient background:  75yo male w/unknown past medical history who presented to the ED via EMS after the son found him unresponsive, face down in the kitchen, incontinent of urine. On arrival to the ED, the patient was arousable only to painful stimuli, hypothermic 25F. Per the ED, the patient son reported to them they call and check on him daily and last spoke with patient day prior to admission. When EMS arrived, the patient was awake but became unresponsive en route to the ED. Was found to be in DKA vs HSS with a blood sugar >600 and lactate 5.8  EVENTS  05/14/20- patient with MSSA bacteremia, critially ill on mechanical ventilator. Remains on vasopressor. Insulin gtt is off today. Active GI bleed s/p PRBC transfusion. 05/15/20-patient remains hypotensive on MV. Possible EGD when more stable - spoke with GI- Dr Lacretia Nicks today. Still with edema 2+ on vasopressors.  05/16/20- patient on MV. A line not functioning will dc today. Pt in sinus rhythm remains intubated septic awaiting tee for endocarditis workup.  05/28/2020 - TEE performed at bedside.  05/18/20 - not waking up off sedation.  Nephrology consulted worsening renal function 05/22/2020 - remains unresponsive off all sedation, secretions from ETT are heavy, will repeat tracheal aspirate. FiO2 is down to 30%, patient intermittetly biting on ETT during examination. He is rhonchorous bilaterally on auscultation. Overnight patient had non bloody vomitus and OGT feeds have been held today. UOP overnight is adequate. 1/23patient with severe neurological deficit 1/24increased WOB, on vent 1/25 severe resp distress, near cardiac arrest 1/26 severe resp failure 1/27 vent asynchrony, sedation changed 1/30 no significant change, unresponsive  CRITICAL CARE CHECKLIST    FULL CODE BLUE  Indwelling Urinary Catheter continued, requirement due to   Reason to continue Indwelling  Urinary Catheter strict Intake/Output monitoring for hemodynamic instability   Central Line/ continued, requirement due to  Reason to continue [employer] Monitoring of central venous pressure or other hemodynamic parameters and poor IV access   Ventilator continued, requirement due to severe respiratory failure   Sedation Goal RASS 0 to -2, vented     Glycemic Control                                  CCM Protocol    PUD Prevention                                   CCM Prophylaxis Protocol    DVT Prevention          CCM Prophylaxis Protocol     OBJECTIVE:  VITALS:  Vent Mode: PCV FiO2 (%):  [24 %] 24 % Set Rate:  [10 bmp] 10 bmp PEEP:  [5 cmH20-10 cmH20] 5 cmH20 Plateau Pressure:  [19 cmH20-20 cmH20] 20 cmH20  BP (!) 141/75 (BP Location: Right Arm)   Pulse 92   Temp 98.6 F (37 C)   Resp 15   Ht 5' 7.99" (1.727 m)   Wt 103 kg   SpO2 94%   BMI 34.53 kg/m    I/O last 3 completed shifts: In: 3051.8 [I.V.:646.4; NG/GT:2405.4] Out: 3350 [Urine:3150; Stool:200] No intake/output data recorded.  SpO2: 94 % FiO2 (%): 24 %  Estimated body mass index is 34.53 kg/m as calculated from the following:   Height as of this encounter:  5' 7.99" (1.727 m).   Weight as of this encounter: 103 kg.  Physical EXAM: General: intubated, unresponsive, synchronous with the ventilator, asynchrony if sedation lightened HENT: [place]/AT, PERRL, eyes deviated down, sclera anicteric Lungs: Coarse to auscultation throughout. No wheezing or rhonchi. Cardiovascular: RRR, s1s2, no murmur Abdomen: soft, non-distended, BS+ Extremities: Anasarca 2+, warm Neuro: No withdrawal to pain, no purposeful movement  GU: Foley catheter in place  Scheduled Meds: . chlorhexidine gluconate (MEDLINE KIT)  15 mL Mouth Rinse BID  . Chlorhexidine Gluconate Cloth  6 each Topical Q0600  . diazepam  5 mg Per Tube Q8H  . feeding supplement (PROSource TF)  45 mL Per Tube BID  . free water  250 mL Per Tube Q4H  .  insulin aspart  0-20 Units Subcutaneous Q4H  . insulin aspart  8 Units Subcutaneous Q4H  . insulin detemir  44 Units Subcutaneous Q12H  . mouth rinse  15 mL Mouth Rinse 10 times per day  . pantoprazole sodium  40 mg Per Tube Daily  . thiamine  100 mg Per Tube Daily   Continuous Infusions: . sodium chloride Stopped (05/20/20 2308)  . dexmedetomidine (PRECEDEX) IV infusion 0.6 mcg/kg/hr (05/30/20 1117)  . feeding supplement (VITAL AF 1.2 CAL) 65 mL/hr at 05/30/20 0512  . HYDROmorphone 1 mg/hr (05/30/20 0626)   PRN Meds:.sodium chloride, acetaminophen (TYLENOL) oral liquid 160 mg/5 mL, acetaminophen, dextrose, docusate, hydrALAZINE, ipratropium-albuterol, midazolam, polyethylene glycol  Anti-infectives (From admission, onward)   Start     Dose/Rate Route Frequency Ordered Stop   05/24/20 1000  ceFAZolin (ANCEF) IVPB 2g/100 mL premix        2 g 200 mL/hr over 30 Minutes Intravenous Every 12 hours 05/21/20 1414 05/26/20 2144   05/18/20 2000  ceFAZolin (ANCEF) IVPB 2g/100 mL premix  Status:  Discontinued        2 g 200 mL/hr over 30 Minutes Intravenous Every 12 hours 05/18/20 1524 05/20/20 1505   05/20/2020 1300  linezolid (ZYVOX) IVPB 600 mg        600 mg 300 mL/hr over 60 Minutes Intravenous Every 12 hours 05/09/2020 1210 05/24/20 0854   05/14/20 2200  metroNIDAZOLE (FLAGYL) IVPB 500 mg  Status:  Discontinued        500 mg 100 mL/hr over 60 Minutes Intravenous Every 8 hours 05/14/20 1736 05/18/20 1101   05/14/20 1515  metroNIDAZOLE (FLAGYL) IVPB 500 mg  Status:  Discontinued        500 mg 100 mL/hr over 60 Minutes Intravenous Every 8 hours 05/14/20 1429 05/14/20 1429   05/13/20 2200  ceFAZolin (ANCEF) IVPB 2g/100 mL premix  Status:  Discontinued        2 g 200 mL/hr over 30 Minutes Intravenous Every 12 hours 05/13/20 1939 05/18/20 1101   05/13/20 1400  piperacillin-tazobactam (ZOSYN) IVPB 3.375 g  Status:  Discontinued        3.375 g 12.5 mL/hr over 240 Minutes Intravenous Every 8 hours  05/13/20 1008 05/13/20 1927   05/03/2020 2315  ceFEPIme (MAXIPIME) 2 g in sodium chloride 0.9 % 100 mL IVPB        2 g 200 mL/hr over 30 Minutes Intravenous  Once 05/30/2020 2309 05/13/20 0034   05/22/2020 2315  metroNIDAZOLE (FLAGYL) IVPB 500 mg        500 mg 100 mL/hr over 60 Minutes Intravenous  Once 05/27/2020 2309 05/13/20 0309   05/24/2020 2315  vancomycin (VANCOCIN) IVPB 1000 mg/200 mL premix  Status:  Discontinued  1,000 mg 200 mL/hr over 60 Minutes Intravenous  Once 05/22/2020 2309 05/02/2020 2313   05/05/2020 2315  vancomycin (VANCOREADY) IVPB 1500 mg/300 mL       "Followed by" Linked Group Details   1,500 mg 150 mL/hr over 120 Minutes Intravenous  Once 05/01/2020 2314 05/13/20 0323   05/19/2020 2315  vancomycin (VANCOCIN) IVPB 1000 mg/200 mL premix  Status:  Discontinued       "Followed by" Linked Group Details   1,000 mg 200 mL/hr over 60 Minutes Intravenous  Once 05/29/2020 2314 05/13/20 1927           ASSESSMENT AND PLAN  SYNOPSIS:  75 yo white male with acute MRSA pneumonia and MSSA bacteremia PROTEUS BACTEREMIAwith severe hyperosmolar nonketotic state and acidosis due to sepsis leading to severe and acute hypoxic resp failure with failure to wean from vent and inability to protect airway due to severe encephalopathy.  RESPIRATORY:      Component Value Date/Time   PHART 7.48 (H) 05/23/2020 1552   PCO2ART 31 (L) 05/23/2020 1552   PO2ART 79 (L) 05/23/2020 1552   HCO3 23.1 05/23/2020 1552   ACIDBASEDEF 0.1 05/23/2020 1552   O2SAT 96.5 05/23/2020 1552   Acute hypoxic respiratory failure, ventilator dependent -continue Full MV support -continue Bronchodilator Therapy -steroids have been weaned, will discontinue -Wean Fio2 and PEEP as tolerated -VAP/VENT bundle implementation -Morbid obesity, mindful of respiratory mechanics (hx OSA now ETT)  -essentially unchanged  .   -Will need TRACH and PEG for long term care, planned for 1 February  CARDIAC: -tele monitoring -EF  50%, no LVH, no LAE, No hemodynamic valvular issues -Pacemaker -No pressors at this time  RENAL: AKI/Renal Failure  Intake/Output Summary (Last 24 hours) at 05/30/2020 6171 Last data filed at 05/30/2020 8541 Gross per 24 hour  Intake 2868.09 ml  Output 2400 ml  Net 468.09 ml  Net IO Since Admission: 6,227.79 mL [05/30/20 0812]   Lab Results  Component Value Date   CREATININE 2.58 (H) 05/30/2020   BUN 107 (H) 05/30/2020   NA 147 (H) 05/30/2020   K 3.6 05/30/2020   CL 116 (H) 05/30/2020   CO2 19 (L) 05/30/2020   -Creatinine 2.58, stable -Continue Foley Catheter-assess need -Avoid nephrotoxic agents -Follow urine output, renal panel -Ensure adequate renal perfusion, optimize oxygenation -Renal dose medications -Persistent uremia, adds to encephalopathy -Need volume reduction -Nephrology consultation appreciated, no plans to dialyze at this time   NEUROLOGY: Acute toxic metabolic encephalopathy Goal RASS zero but this has been challenging due to ventilator asynchrony Switched Fentanyl to Dilaudid for tachyphylaxis, continue to wean off Valium per tube reduce precedex to off CT head 05/20/20 without acute finding EEG 05/27/20 without epileptiform features MRI excluded by pacer Follow-up CT/EEG Poor prognosis  DDx: Persistent comatose state after hyperosmolar nonketotic state, persistent uremia, anoxic encephalopathy.  Episodes of ventilator asynchrony likely due to "neurogenic" breathing resulting from severe brain injury.  INFECTIOUS DISEASE:  Lab Results  Component Value Date   WBC 11.9 (H) 05/30/2020   Temp Readings from Last 3 Encounters:  05/30/20 98.6 F (37 C)   -Leukocytosis persistent -Remains afebrile -ID consultation appreciated  -05/14/20 tracheal aspirate with MRSA, completed Zyvox 7/7 for suspected PNA -05/20/2020 blood culture MSSA and proteus, completed cefazolin 14/14  -Pacemaker, TEE without vegetations or lead involvment  GI: GI PROPHYLAXIS as  indicated GIB?  05/13/20 FOB+, no overt melena Hemoglobin remains stable  F/E/N: -hypernatremia  -hyperchloremic metabolic acidosis related to renal dysfunction -Continue free water -TF's  as tolerated -bowel protocol as indicated -follow labs as needed -replace as needed -nutrition consultation and following  ENDO: -Diabetes mellitus -resolved hyperosmolar nonketotic state (ketones negative),still issue with hyperglycemia -Suspect acidosis with due to concomitant sepsis and cardiac arrest at the time of admission -will use ICU hypoglycemic\Hyperglycemia protocol -Discontinued steroids at this time     ATTESTATION:  I Assessed the need for Labs I Assessed the need for Foley I Assessed the need for Central Venous Line I Assessed the need for Mobilization I made an Assessment of medications to be adjusted accordingly with pharmacy Safety Risk assessment completed  Critical Care Time devoted to patient care services described in this note is 35 minutes.  Overall, patient is critically ill, prognosis is poor overall.   Patient with multiorgan failure and at high risk for cardiac arrest and death.  Images reviewed directly and interpretation in A&P is my own unless noted Labs reviewed and evaluated as noted in A/P  C. Derrill Kay, MD [facility]   *This note was dictated using voice recognition software/Dragon.  Despite best efforts to proofread, errors can occur which can change the meaning.  Any change was purely unintentional.

## 2020-05-30 NOTE — TOC Progression Note (Signed)
Transition of Care [facility]) - Progression Note    Patient Details  Name: Billy Barton MRN: [id number] Date of Birth: Aug 04, 1945  [facility] CM/SW Contact  Bing Quarry, RN Phone Number: 05/30/2020, 9:50 AM  Clinical Narrative:  1/30. Per provider notes: On 05/27/2020 patient found down by family who checks on him daily. BS >600. Developed GI bleed with transfusions 05/14/20. 1/22 No waking up after weaning from sedation. Neuro deficits on 1/23. 1/25. Resp. distress and near cardiac arrest.   1/29 Continues on mechanical ventilation for severe respiratory failure. TOC consult 1/30 for Long Term Care placement. Trach placement pending. TOC will follow per consult comments. Gabriel Cirri RN CM          Expected Discharge Plan and Services                                                 Social Determinants of Health (SDOH) Interventions    Readmission Risk Interventions No flowsheet data found.

## 2020-05-30 NOTE — Significant Event (Signed)
Patient developed a synchronous breathing.  On assessment it appears to be a neurogenic breathing pattern indicating very severe encephalopathy likely anoxic.    There is indication that apparently patient had a living will that stated that he would not have wanted ongoing ventilator support.  This issue will need to be assessed and will place a palliative care consult for determining goals of care.   Examination this evening is consistent with a very severe encephalopathy and given that the patient has been unresponsive for 17 days the likelihood of meaningful recovery is nil.   Gailen Shelter, MD [facility]   *This note was dictated using voice recognition software/Dragon.  Despite best efforts to proofread, errors can occur which can change the meaning.  Any change was purely unintentional.

## 2020-05-30 NOTE — Progress Notes (Signed)
Billy Barton  MRN: [id number]  DOB/AGE: October 19, 1945 75 y.o.  Primary Care Physician:Billy Barton  Admit date: 05/08/2020  Chief Complaint:  Chief Complaint  Patient presents with  . Altered Mental Status    S-Pt presented on  05/15/2020 with  Chief Complaint  Patient presents with  . Altered Mental Status  . Patient remains intubated, unable to offer any complaints  Medications . chlorhexidine gluconate (MEDLINE KIT)  15 mL Mouth Rinse BID  . Chlorhexidine Gluconate Cloth  6 each Topical Q0600  . diazepam  5 mg Barton Tube Q8H  . feeding supplement (PROSource TF)  45 mL Barton Tube BID  . free water  250 mL Barton Tube Q4H  . insulin aspart  0-20 Units Subcutaneous Q4H  . insulin aspart  8 Units Subcutaneous Q4H  . insulin detemir  44 Units Subcutaneous Q12H  . mouth rinse  15 mL Mouth Rinse 10 times Barton day  . pantoprazole sodium  40 mg Barton Tube Daily  . thiamine  100 mg Barton Tube Daily         ROS: Unable to get any data Physical Exam: Vital signs in last 24 hours: Temp:  [98.42 F (36.9 C)-99.14 F (37.3 C)] 98.6 F (37 C) (01/30 0600) Pulse Rate:  [80-98] 92 (01/30 0600) Resp:  [9-16] 15 (01/30 0600) BP: (141-166)/(75-90) 141/75 (01/30 0600) SpO2:  [94 %-97 %] 94 % (01/30 0600) FiO2 (%):  [24 %] 24 % (01/30 0735) Weight change:  Last BM Date: 05/30/20  Intake/Output from previous day: 01/29 0701 - 01/30 0700 In: 2868.1 [I.V.:462.7; NG/GT:2405.4] Out: 2400 [Urine:2200; Stool:200] No intake/output data recorded.   Physical Exam:  General- pt is intubated  HEENT-ET tube in situ, trachea is midline  Resp-bilateral breath sounds present, rhonchi present.  CVS- S1S2 regular in rate and rhythm  GIT- BS+, soft, Non tender , Non distended  EXT- 2+ LE Edema,  No Cyanosis    Lab Results:  CBC  Recent Labs    05/29/20 0343 05/30/20 0508  WBC 8.7 11.9*  HGB 8.2* 9.1*  HCT 27.0* 30.0*  PLT 333 377    BMET  Recent Labs    05/29/20 0343  05/30/20 0508  NA 147* 147*  K 4.4 3.6  CL 116* 116*  CO2 21* 19*  GLUCOSE 248* 200*  BUN 111* 107*  CREATININE 2.52* 2.58*  CALCIUM 8.3* 8.6*      Most recent Creatinine trend  Lab Results  Component Value Date   CREATININE 2.58 (H) 05/30/2020   CREATININE 2.52 (H) 05/29/2020   CREATININE 2.57 (H) 05/28/2020      MICRO   Recent Results (from the past 240 hour(s))  Culture, respiratory     Status: None   Collection Time: 05/22/20 11:26 AM   Specimen: Tracheal Aspirate; Respiratory  Result Value Ref Range Status   Specimen Description   Final    TRACHEAL ASPIRATE Performed at [facility], [address] [place], [place] [address]    Special Requests   Final    NONE Performed at [facility], [address] [place], [place] [address]    Gram Stain   Final    RARE WBC PRESENT, PREDOMINANTLY PMN NO ORGANISMS SEEN    Culture   Final    NO GROWTH 2 DAYS Performed at [facility], [place] [address] [place], [place] [address]    Report Status 05/24/2020 FINAL  Final         Impression:  [name] is a 75 year old Caucasian male with a past medical history of  coronary artery disease, diabetes mellitus type 2, history of AICD placementwho was admitted to [facility] on [facility]2022for DKA, type 2 ([place]) [E11.10] Altered mental status, unspecified altered mental status type [R41.82] Gastrointestinal hemorrhage, unspecified gastrointestinal hemorrhage type [K92.2] Sepsis, due to unspecified organism, unspecified whether acute organ dysfunction present ([place]) [A41.9] Hyperosmolar hyperglycemic state (HHS) ([place]) [E11.00, E11.65]  Patient found to have MSSA bacteremia and diabetic ketoacisosis. Patient was also found to have GI bleed and required PRBC transfusions . Placed on IV PPI/pantoprazole and endoscopy deferred. Patient was in atrial flutter. He has an AICD. He converted to sinus rhythm.  Patient later had TEE performed on 1/17 with no  vegetations.  Patient was weaned off vasopressors. Nephrology consulted for acute renal failure.  Patient at the time of admission had creatinine of 4.34. There is no baseline creatinine available.  Patient had nonoliguric ATN/adequate urine output and did not require renal replacement therapy. But patient developed free water deficit with hypernatremia from loop diuretics. He remains volume overloaded but furosemide held and he is getting free water flushes.  Patient isnot waking up off sedation. EEG yesterday.    1)Renal    AKI secondary to ATN Patient most likely has AKI on CKD Patient CKD stage/duration is unsure as there is no previous data available Patient has CKD most likely from diabetes mellitus  Creatinine trend 2022 4.3==>5.0=>2.5   2)HTN    Blood pressure is stable for the acute state   3)Anemia of chronic disease  CBC Latest Ref Rng & Units 05/30/2020 05/29/2020 05/28/2020  WBC 4.0 - 10.5 K/uL 11.9(H) 8.7 10.3  Hemoglobin 13.0 - 17.0 g/dL 9.1(L) 8.2(L) 8.5(L)  Hematocrit 39.0 - 52.0 % 30.0(L) 27.0(L) 28.2(L)  Platelets 150 - 400 K/uL 377 333 348       HGb is now at goal (9--11) Patient hemoglobin stable.  Patient hemoglobin was 6.8 on July 13, patient required PRBC    4) Secondary hyperparathyroidism -CKD Mineral-Bone Disorder    Lab Results  Component Value Date   PTH 44 05/18/2020   CALCIUM 8.6 (L) 05/30/2020   PHOS 4.4 05/30/2020    Secondary Hyperparathyroidism present Phosphorus is now at goal.   5) sepsis Patient had sepsis from MSSA and Proteus mirabilis bacteremia ID/primary team is following   6) Electrolytes   BMP Latest Ref Rng & Units 05/30/2020 05/29/2020 05/28/2020  Glucose 70 - 99 mg/dL 200(H) 248(H) 313(H)  BUN 8 - 23 mg/dL 107(H) 111(H) 100(H)  Creatinine 0.61 - 1.24 mg/dL 2.58(H) 2.52(H) 2.57(H)  Sodium 135 - 145 mmol/L 147(H) 147(H) 145  Potassium 3.5 - 5.1 mmol/L 3.6 4.4 4.6  Chloride 98 - 111 mmol/L 116(H) 116(H) 112(H)   CO2 22 - 32 mmol/L 19(L) 21(L) 20(L)  Calcium 8.9 - 10.3 mg/dL 8.6(L) 8.3(L) 7.9(L)     Sodium Hypernatremia Patient had a sodium level of 156 on May 17, patient sodium is now stable Will continue free water inatke Stable   Potassium Normokalemic    7)Acid base  Co2 is not at at goal   AG 147-135=12 Delta AG  12-5=7  Delta BIcarb  24-19=5  Lactic acidosis? We will check serum lactate   Plan:   We will increase the free water intake We will check serum lactate We will follow Chem-7 No need for renal placement therapy    Latunya Kissick s Lysle Yero 05/30/2020, 1:39 PM

## 2020-05-30 NOTE — Progress Notes (Signed)
PHARMACY CONSULT NOTE  Pharmacy Consult for Electrolyte Monitoring and Replacement   Recent Labs: Potassium (mmol/L)  Date Value  05/30/2020 3.6   Magnesium (mg/dL)  Date Value  10/29/1599 2.4   Calcium (mg/dL)  Date Value  09/32/3557 8.6 (L)   Albumin (g/dL)  Date Value  32/20/2542 1.6 (L)   Phosphorus (mg/dL)  Date Value  70/62/3762 4.4   Sodium (mmol/L)  Date Value  05/30/2020 147 (H)   Assessment: 75 year old male admitted with metabolic acidosis s/t DKA vs HHS. Code Blue called after arrival to ICU and patient intubated. Blood cultures positive for MSSA.  Pharmacy to manage electrolytes.  Nutrition: ProSource 90 mL TID + Vital 45 mL/hr Diuretics: furosemide 40 mg IV daily (d/c 1/26)  Goal of Therapy:  Electrolytes WNL  Plan:   Hypernatremia: continue free water flushes 150 mL q4h (900 mL/day)  No electrolyte replacement warranted today  Re-check electrolytes in AM  Pricilla Riffle, PharmD 05/30/2020 7:26 AM

## 2020-05-31 DIAGNOSIS — A4101 Sepsis due to Methicillin susceptible Staphylococcus aureus: Secondary | ICD-10-CM

## 2020-05-31 DIAGNOSIS — Z7189 Other specified counseling: Secondary | ICD-10-CM

## 2020-05-31 DIAGNOSIS — R6521 Severe sepsis with septic shock: Secondary | ICD-10-CM

## 2020-05-31 DIAGNOSIS — N179 Acute kidney failure, unspecified: Secondary | ICD-10-CM | POA: Diagnosis not present

## 2020-05-31 DIAGNOSIS — J9601 Acute respiratory failure with hypoxia: Secondary | ICD-10-CM | POA: Diagnosis not present

## 2020-05-31 LAB — CBC WITH DIFFERENTIAL/PLATELET
Abs Immature Granulocytes: 0.12 10*3/uL — ABNORMAL HIGH (ref 0.00–0.07)
Basophils Absolute: 0 10*3/uL (ref 0.0–0.1)
Basophils Relative: 0 %
Eosinophils Absolute: 0.5 10*3/uL (ref 0.0–0.5)
Eosinophils Relative: 4 %
HCT: 28 % — ABNORMAL LOW (ref 39.0–52.0)
Hemoglobin: 8.3 g/dL — ABNORMAL LOW (ref 13.0–17.0)
Immature Granulocytes: 1 %
Lymphocytes Relative: 11 %
Lymphs Abs: 1.3 10*3/uL (ref 0.7–4.0)
MCH: 29.1 pg (ref 26.0–34.0)
MCHC: 29.6 g/dL — ABNORMAL LOW (ref 30.0–36.0)
MCV: 98.2 fL (ref 80.0–100.0)
Monocytes Absolute: 0.9 10*3/uL (ref 0.1–1.0)
Monocytes Relative: 7 %
Neutro Abs: 9.6 10*3/uL — ABNORMAL HIGH (ref 1.7–7.7)
Neutrophils Relative %: 77 %
Platelets: 303 10*3/uL (ref 150–400)
RBC: 2.85 MIL/uL — ABNORMAL LOW (ref 4.22–5.81)
RDW: 17.7 % — ABNORMAL HIGH (ref 11.5–15.5)
WBC: 12.4 10*3/uL — ABNORMAL HIGH (ref 4.0–10.5)
nRBC: 0 % (ref 0.0–0.2)

## 2020-05-31 LAB — BASIC METABOLIC PANEL
Anion gap: 12 (ref 5–15)
BUN: 111 mg/dL — ABNORMAL HIGH (ref 8–23)
CO2: 19 mmol/L — ABNORMAL LOW (ref 22–32)
Calcium: 8.4 mg/dL — ABNORMAL LOW (ref 8.9–10.3)
Chloride: 119 mmol/L — ABNORMAL HIGH (ref 98–111)
Creatinine, Ser: 2.73 mg/dL — ABNORMAL HIGH (ref 0.61–1.24)
GFR, Estimated: 24 mL/min — ABNORMAL LOW (ref >60)
Glucose, Bld: 102 mg/dL — ABNORMAL HIGH (ref 70–99)
Potassium: 3.9 mmol/L (ref 3.5–5.1)
Sodium: 150 mmol/L — ABNORMAL HIGH (ref 135–145)

## 2020-05-31 LAB — PHOSPHORUS: Phosphorus: 7.5 mg/dL — ABNORMAL HIGH (ref 2.5–4.6)

## 2020-05-31 LAB — MAGNESIUM: Magnesium: 2.5 mg/dL — ABNORMAL HIGH (ref 1.7–2.4)

## 2020-05-31 LAB — GLUCOSE, CAPILLARY
Glucose-Capillary: 95 mg/dL (ref 70–99)
Glucose-Capillary: 120 mg/dL — ABNORMAL HIGH (ref 70–99)
Glucose-Capillary: 134 mg/dL — ABNORMAL HIGH (ref 70–99)

## 2020-05-31 LAB — TRIGLYCERIDES: Triglycerides: 123 mg/dL (ref <150)

## 2020-05-31 MED ORDER — POLYVINYL ALCOHOL 1.4 % OP SOLN
1.0000 [drp] | Freq: Four times a day (QID) | OPHTHALMIC | Status: DC | PRN
  Filled 2020-05-31: qty 15

## 2020-05-31 MED ORDER — GLYCOPYRROLATE 0.2 MG/ML IJ SOLN
0.2000 mg | INTRAMUSCULAR | Status: DC | PRN
  Administered 2020-05-31: 0.2 mg via SUBCUTANEOUS

## 2020-05-31 MED ORDER — DIPHENHYDRAMINE HCL 50 MG/ML IJ SOLN: 25.0000 mg | INTRAMUSCULAR | Status: DC | PRN

## 2020-05-31 MED ORDER — MIDAZOLAM HCL 2 MG/2ML IJ SOLN
2.0000 mg | INTRAMUSCULAR | Status: DC | PRN
  Administered 2020-05-31 (×2): 2 mg via INTRAVENOUS
  Filled 2020-05-31 (×2): qty 2

## 2020-05-31 MED ORDER — ALBUMIN HUMAN 25 % IV SOLN
12.5000 g | Freq: Two times a day (BID) | INTRAVENOUS | Status: DC
  Administered 2020-05-31: 12.5 g via INTRAVENOUS
  Filled 2020-05-31: qty 50

## 2020-05-31 MED ORDER — HYDROMORPHONE BOLUS VIA INFUSION
1.0000 mg | INTRAVENOUS | Status: DC | PRN
  Administered 2020-05-31 (×2): 1 mg via INTRAVENOUS
  Filled 2020-05-31: qty 1

## 2020-05-31 MED ORDER — GLYCOPYRROLATE 1 MG PO TABS
1.0000 mg | ORAL_TABLET | ORAL | Status: DC | PRN
  Filled 2020-05-31: qty 1

## 2020-05-31 MED ORDER — GLYCOPYRROLATE 0.2 MG/ML IJ SOLN
0.2000 mg | INTRAMUSCULAR | Status: DC | PRN
  Filled 2020-05-31: qty 1

## 2020-06-01 LAB — BENZODIAZEPINES CONFIRM, URINE
Alprazolam: NEGATIVE
Benzodiazepines: POSITIVE ng/mL — AB
Clonazepam: NEGATIVE
Flurazepam: NEGATIVE
Lorazepam: NEGATIVE
Midazolam Conf.: 671 ng/mL
Midazolam: POSITIVE — AB
Nordiazepam: NEGATIVE
Oxazepam: NEGATIVE
Temazepam: NEGATIVE
Triazolam: NEGATIVE

## 2020-06-01 LAB — DRUG SCREEN 764883 11+OXYCO+ALC+CRT-BUND
Amphetamines, Urine: NEGATIVE ng/mL
Barbiturate: NEGATIVE ng/mL
Cannabinoid Quant, Ur: NEGATIVE ng/mL
Cocaine (Metabolite): NEGATIVE ng/mL
Creatinine: 70.3 mg/dL (ref 20.0–300.0)
Ethanol: NEGATIVE %
Meperidine: NEGATIVE ng/mL
Methadone Screen, Urine: NEGATIVE ng/mL
OPIATE SCREEN URINE: NEGATIVE ng/mL
Oxycodone/Oxymorphone, Urine: NEGATIVE ng/mL
Phencyclidine: NEGATIVE ng/mL
Propoxyphene, Urine: NEGATIVE ng/mL
Tramadol: NEGATIVE ng/mL
pH, Urine: 5.2 (ref 4.5–8.9)

## 2020-06-01 SURGERY — CREATION, TRACHEOSTOMY
Anesthesia: General

## 2020-06-01 NOTE — Progress Notes (Addendum)
PHARMACY CONSULT NOTE  Pharmacy Consult for Electrolyte Monitoring and Replacement   Recent Labs: Potassium (mmol/L)  Date Value  05/10/2020 3.9   Magnesium (mg/dL)  Date Value  03/54/6568 2.5 (H)   Calcium (mg/dL)  Date Value  12/75/1700 8.4 (L)   Albumin (g/dL)  Date Value  17/49/4496 1.6 (L)   Phosphorus (mg/dL)  Date Value  75/91/6384 7.5 (H)   Sodium (mmol/L)  Date Value  05/08/2020 150 (H)   Assessment: 75 year old male admitted with metabolic acidosis s/t DKA vs HHS. Code Blue called after arrival to ICU and patient intubated. Blood cultures positive for MSSA.  Pharmacy to manage electrolytes.  Nutrition: ProSource 45 mL BID + Vital 65 mL/hr Diuretics: Intermittent IV Lasix  Goal of Therapy:  Electrolytes WNL  Plan:   Hypernatremia: continue free water flushes 300 mL q4h (1.8 L/day)  No electrolyte replacement warranted today  Re-check electrolytes in AM  Tressie Ellis 05/14/2020 8:18 AM

## 2020-06-01 NOTE — Progress Notes (Signed)
Pt. Extubated to room air. 

## 2020-06-01 NOTE — Progress Notes (Signed)
[facility]  ROUNDING NOTE   Subjective:   HPI: 75yo male w/unknown past medical history who presented to the ED via EMS after the son found him unresponsive, face down in the kitchen, incontinent of urine. On arrival to the ED, the patient was arousable only to painful stimuli, hypothermic 82F. Per the ED, the patient son reported to them they call and check on him daily and last spoke withpatient day prior to admission. When EMS arrived, the patient was awake but became unresponsive en route to the ED. Was found to be in DKA vs HSS with a blood sugar >600 and lactate 5.8  Hospital course complicated by MSSA bactermia, proteus bactermia, Acute resp failure';  Today Patient remains critically ill, ventilator dependent Fio2 40% Dilaudid, fentanyl, precedex TF @ 65 cc/hr  01/30 0701 - 01/31 0700 In: 3545.5 [I.V.:733.5; NG/GT:2812] Out: 5361 [Urine:1550; Stool:100]    Objective:  Vital signs in last 24 hours:  Temp:  [95 F (35 C)-97.88 F (36.6 C)] 95.54 F (35.3 C) (01/31 0900) Pulse Rate:  [72-122] 72 (01/31 0900) Resp:  [14-19] 15 (01/31 0900) BP: (100-204)/(55-93) 112/58 (01/31 0900) SpO2:  [87 %-100 %] 96 % (01/31 0900) FiO2 (%):  [24 %-50 %] 40 % (01/31 0750) Weight:  [106.5 kg] 106.5 kg (01/31 0443)  Weight change:  Filed Weights   05/28/20 0329 05/29/20 0314 2020/06/19 0443  Weight: 103.7 kg 103 kg 106.5 kg    Intake/Output: I/O last 3 completed shifts: In: 6179.5 [I.V.:962; NG/GT:5217.4] Out: 2800 [Urine:2500; Stool:300]   Intake/Output this shift:  Total I/O In: 205.2 [I.V.:75.2; NG/GT:130] Out: 300 [Urine:300]  Physical Exam: General:  Critically ill  HEENT  ETT, anicteric  Lungs:   vent assisted FiO2 28%  Heart:  regular  Abdomen:   Soft, obese, bowel sounds present  Extremities:  ++ peripheral edema.  Neurologic:  Intubated, sedated  Skin:  warm dry  Access:  No dialysis access    Basic Metabolic Panel: Recent Labs  Lab  05/27/20 0327 05/28/20 0328 05/29/20 0343 05/30/20 0508 2020-06-19 0444  NA 145 145 147* 147* 150*  K 3.7 4.6 4.4 3.6 3.9  CL 112* 112* 116* 116* 119*  CO2 20* 20* 21* 19* 19*  GLUCOSE 225* 313* 248* 200* 102*  BUN 99* 100* 111* 107* 111*  CREATININE 2.68* 2.57* 2.52* 2.58* 2.73*  CALCIUM 8.1* 7.9* 8.3* 8.6* 8.4*  MG 2.4 2.4 2.6* 2.4 2.5*  PHOS 7.1* 7.6* 5.7* 4.4 7.5*    Liver Function Tests: No results for input(s): AST, ALT, ALKPHOS, BILITOT, PROT, ALBUMIN in the last 168 hours. No results for input(s): LIPASE, AMYLASE in the last 168 hours. No results for input(s): AMMONIA in the last 168 hours.  CBC: Recent Labs  Lab 05/27/20 0327 05/28/20 0328 05/29/20 0343 05/30/20 0508 06-19-20 0444  WBC 9.2 10.3 8.7 11.9* 12.4*  NEUTROABS 7.1 9.1* 7.2 8.2* 9.6*  HGB 8.5* 8.5* 8.2* 9.1* 8.3*  HCT 26.4* 28.2* 27.0* 30.0* 28.0*  MCV 93.6 96.9 96.8 96.8 98.2  PLT 279 348 333 377 303    Cardiac Enzymes: No results for input(s): CKTOTAL, CKMB, CKMBINDEX, TROPONINI in the last 168 hours.  BNP: Invalid input(s): POCBNP  CBG: Recent Labs  Lab 05/30/20 1553 05/30/20 1929 05/30/20 2337 06-19-20 0432 June 19, 2020 0718  GLUCAP 119* 180* 110* 95 120*    Microbiology: Results for orders placed or performed during the hospital encounter of 05/13/2020  Blood culture (routine single)     Status: Abnormal   Collection  Time: 05/11/2020 10:24 PM   Specimen: BLOOD  Result Value Ref Range Status   Specimen Description   Final    BLOOD LEFT ASSIST CONTROL Performed at [facility], [address] [place], [place] [address]    Special Requests   Final    BOTTLES DRAWN AEROBIC AND ANAEROBIC Blood Culture adequate volume Performed at [facility], [address] [place], [place] [address]    Culture  Setup Time   Final    Organism ID to follow GRAM POSITIVE COCCI IN BOTH AEROBIC AND ANAEROBIC BOTTLES CRITICAL RESULT CALLED TO, READ BACK BY AND VERIFIED WITH: MYRA  SLAUGHTER AT 1309 ON 05/13/20 SNG GRAM NEGATIVE RODS ANAEROBIC BOTTLE ONLY CRITICAL RESULT CALLED TO, READ BACK BY AND VERIFIED WITH: MORGAN HICKS PHARMD $[email or web address]  05/14/20 EB Performed at [facility], [place] [address] [place], [place] [address]    Culture STAPHYLOCOCCUS AUREUS PROTEUS MIRABILIS  (A)  Final   Report Status 05/16/2020 FINAL  Final   Organism ID, Bacteria STAPHYLOCOCCUS AUREUS  Final   Organism ID, Bacteria PROTEUS MIRABILIS  Final      Susceptibility   Proteus mirabilis - MIC*    AMPICILLIN <=2 SENSITIVE Sensitive     CEFAZOLIN <=4 SENSITIVE Sensitive     CEFEPIME <=0.12 SENSITIVE Sensitive     CEFTAZIDIME <=1 SENSITIVE Sensitive     CEFTRIAXONE <=0.25 SENSITIVE Sensitive     CIPROFLOXACIN <=0.25 SENSITIVE Sensitive     GENTAMICIN <=1 SENSITIVE Sensitive     IMIPENEM 8 INTERMEDIATE Intermediate     TRIMETH/SULFA <=20 SENSITIVE Sensitive     AMPICILLIN/SULBACTAM <=2 SENSITIVE Sensitive     PIP/TAZO <=4 SENSITIVE Sensitive     * PROTEUS MIRABILIS   Staphylococcus aureus - MIC*    CIPROFLOXACIN <=0.5 SENSITIVE Sensitive     ERYTHROMYCIN <=0.25 SENSITIVE Sensitive     GENTAMICIN <=0.5 SENSITIVE Sensitive     OXACILLIN 0.5 SENSITIVE Sensitive     TETRACYCLINE <=1 SENSITIVE Sensitive     VANCOMYCIN 1 SENSITIVE Sensitive     TRIMETH/SULFA <=10 SENSITIVE Sensitive     CLINDAMYCIN <=0.25 SENSITIVE Sensitive     RIFAMPIN <=0.5 SENSITIVE Sensitive     Inducible Clindamycin NEGATIVE Sensitive     * STAPHYLOCOCCUS AUREUS  Culture, blood (single)     Status: None   Collection Time: 05/10/2020 10:24 PM   Specimen: BLOOD  Result Value Ref Range Status   Specimen Description BLOOD LEFT HAND  Final   Special Requests   Final    BOTTLES DRAWN AEROBIC AND ANAEROBIC Blood Culture adequate volume   Culture   Final    NO GROWTH 5 DAYS Performed at [facility], [address] [place],  [address]    Report Status 05/03/2020 FINAL  Final  Blood Culture ID  Panel (Reflexed)     Status: Abnormal   Collection Time: 05/18/2020 10:24 PM  Result Value Ref Range Status   Enterococcus faecalis NOT DETECTED NOT DETECTED Final   Enterococcus Faecium NOT DETECTED NOT DETECTED Final   Listeria monocytogenes NOT DETECTED NOT DETECTED Final   Staphylococcus species DETECTED (A) NOT DETECTED Final    Comment: CRITICAL RESULT CALLED TO, READ BACK BY AND VERIFIED WITH: MYRA SLAUGHTER AT 1309 ON 05/13/20 SNG    Staphylococcus aureus (BCID) DETECTED (A) NOT DETECTED Final    Comment: CRITICAL RESULT CALLED TO, READ BACK BY AND VERIFIED WITH: MYRA SLAUGHTER AT 1309 05/13/20 SNG    Staphylococcus epidermidis NOT DETECTED NOT DETECTED Final  Staphylococcus lugdunensis NOT DETECTED NOT DETECTED Final   Streptococcus species NOT DETECTED NOT DETECTED Final   Streptococcus agalactiae NOT DETECTED NOT DETECTED Final   Streptococcus pneumoniae NOT DETECTED NOT DETECTED Final   Streptococcus pyogenes NOT DETECTED NOT DETECTED Final   A.calcoaceticus-baumannii NOT DETECTED NOT DETECTED Final   Bacteroides fragilis NOT DETECTED NOT DETECTED Final   Enterobacterales NOT DETECTED NOT DETECTED Final   Enterobacter cloacae complex NOT DETECTED NOT DETECTED Final   Escherichia coli NOT DETECTED NOT DETECTED Final   Klebsiella aerogenes NOT DETECTED NOT DETECTED Final   Klebsiella oxytoca NOT DETECTED NOT DETECTED Final   Klebsiella pneumoniae NOT DETECTED NOT DETECTED Final   Proteus species NOT DETECTED NOT DETECTED Final   Salmonella species NOT DETECTED NOT DETECTED Final   Serratia marcescens NOT DETECTED NOT DETECTED Final   Haemophilus influenzae NOT DETECTED NOT DETECTED Final   Neisseria meningitidis NOT DETECTED NOT DETECTED Final   Pseudomonas aeruginosa NOT DETECTED NOT DETECTED Final   Stenotrophomonas maltophilia NOT DETECTED NOT DETECTED Final   Candida albicans NOT DETECTED NOT DETECTED Final   Candida auris NOT DETECTED NOT DETECTED Final   Candida  glabrata NOT DETECTED NOT DETECTED Final   Candida krusei NOT DETECTED NOT DETECTED Final   Candida parapsilosis NOT DETECTED NOT DETECTED Final   Candida tropicalis NOT DETECTED NOT DETECTED Final   Cryptococcus neoformans/gattii NOT DETECTED NOT DETECTED Final   Meth resistant mecA/C and MREJ NOT DETECTED NOT DETECTED Final    Comment: Performed at [facility], [address] [place], [place] [address]  Urine culture     Status: None   Collection Time: 05/13/20 12:26 AM   Specimen: In/Out Cath Urine  Result Value Ref Range Status   Specimen Description   Final    IN/OUT CATH URINE Performed at [facility], [address], [place], [place] [address]    Special Requests   Final    NONE Performed at [facility], [address] [place], [place] [address]    Culture   Final    NO GROWTH Performed at [facility], [address], [place], [place] [address]    Report Status 05/14/2020 FINAL  Final  Resp Panel by RT-PCR (Flu A&B, Covid) Nasopharyngeal Swab     Status: None   Collection Time: 05/13/20 12:26 AM   Specimen: Nasopharyngeal Swab; Nasopharyngeal(NP) swabs in vial transport medium  Result Value Ref Range Status   SARS Coronavirus 2 by RT PCR NEGATIVE NEGATIVE Final    Comment: (NOTE) SARS-CoV-2 target nucleic acids are NOT DETECTED.  The SARS-CoV-2 RNA is generally detectable in upper respiratory specimens during the acute phase of infection. The lowest concentration of SARS-CoV-2 viral copies this assay can detect is 138 copies/mL. A negative result does not preclude SARS-Cov-2 infection and should not be used as the sole basis for treatment or other patient management decisions. A negative result may occur with  improper specimen collection/handling, submission of specimen other than nasopharyngeal swab, presence of viral mutation(s) within the areas targeted by this assay, and inadequate number of viral copies(<138 copies/mL). A  negative result must be combined with clinical observations, patient history, and epidemiological information. The expected result is Negative.  Fact Sheet for Patients:  EntrepreneurPulse.com.au  Fact Sheet for Healthcare Providers:  IncredibleEmployment.be  This test is no t yet approved or cleared by the [place] FDA and  has been authorized for detection and/or diagnosis of SARS-CoV-2 by FDA under an Emergency Use  Authorization (EUA). This EUA will remain  in effect (meaning this test can be used) for the duration of the COVID-19 declaration under Section 564(b)(1) of the Act, 21 U.S.C.section 360bbb-3(b)(1), unless the authorization is terminated  or revoked sooner.       Influenza A by PCR NEGATIVE NEGATIVE Final   Influenza B by PCR NEGATIVE NEGATIVE Final    Comment: (NOTE) The Xpert Xpress SARS-CoV-2/FLU/RSV plus assay is intended as an aid in the diagnosis of influenza from Nasopharyngeal swab specimens and should not be used as a sole basis for treatment. Nasal washings and aspirates are unacceptable for Xpert Xpress SARS-CoV-2/FLU/RSV testing.  Fact Sheet for Patients: EntrepreneurPulse.com.au  Fact Sheet for Healthcare Providers: IncredibleEmployment.be  This test is not yet approved or cleared by the [place] FDA and has been authorized for detection and/or diagnosis of SARS-CoV-2 by FDA under an Emergency Use Authorization (EUA). This EUA will remain in effect (meaning this test can be used) for the duration of the COVID-19 declaration under Section 564(b)(1) of the Act, 21 U.S.C. section 360bbb-3(b)(1), unless the authorization is terminated or revoked.  Performed at [facility], [address] [place], [place] [address]   Culture, blood (Routine X 2) w Reflex to ID Panel     Status: None   Collection Time: 05/14/20  2:41 PM   Specimen: BLOOD  Result Value Ref  Range Status   Specimen Description BLOOD RIGHT ANTECUBITAL  Final   Special Requests   Final    BOTTLES DRAWN AEROBIC AND ANAEROBIC Blood Culture adequate volume   Culture   Final    NO GROWTH 5 DAYS Performed at [facility], [address] [place], [place] [address]    Report Status 05/19/2020 FINAL  Final  Culture, blood (Routine X 2) w Reflex to ID Panel     Status: None   Collection Time: 05/14/20  2:55 PM   Specimen: BLOOD  Result Value Ref Range Status   Specimen Description BLOOD BLOOD RIGHT HAND  Final   Special Requests   Final    BOTTLES DRAWN AEROBIC AND ANAEROBIC Blood Culture results may not be optimal due to an excessive volume of blood received in culture bottles   Culture   Final    NO GROWTH 5 DAYS Performed at [facility], [address] [place], [place] [address]    Report Status 05/19/2020 FINAL  Final  Culture, respiratory (non-expectorated)     Status: None   Collection Time: 05/14/20  5:34 PM   Specimen: Tracheal Aspirate; Respiratory  Result Value Ref Range Status   Specimen Description   Final    TRACHEAL ASPIRATE Performed at [facility], [address], [place], [place] [address]    Special Requests   Final    NONE Performed at [facility], [address] [place], [place] [address]    Gram Stain   Final    MODERATE WBC PRESENT, PREDOMINANTLY PMN FEW GRAM POSITIVE COCCI IN CLUSTERS Performed at [facility], [place] [address] [place], [place] [address]    Culture RARE METHICILLIN RESISTANT STAPHYLOCOCCUS AUREUS  Final   Report Status 05/03/2020 FINAL  Final   Organism ID, Bacteria METHICILLIN RESISTANT STAPHYLOCOCCUS AUREUS  Final      Susceptibility   Methicillin resistant staphylococcus aureus - MIC*    CIPROFLOXACIN >=8 RESISTANT Resistant     ERYTHROMYCIN >=8 RESISTANT Resistant     GENTAMICIN <=0.5 SENSITIVE Sensitive     OXACILLIN >=4  RESISTANT Resistant     TETRACYCLINE <=1 SENSITIVE  Sensitive     VANCOMYCIN <=0.5 SENSITIVE Sensitive     TRIMETH/SULFA <=10 SENSITIVE Sensitive     CLINDAMYCIN >=8 RESISTANT Resistant     RIFAMPIN <=0.5 SENSITIVE Sensitive     Inducible Clindamycin NEGATIVE Sensitive     * RARE METHICILLIN RESISTANT STAPHYLOCOCCUS AUREUS  MRSA PCR Screening     Status: Abnormal   Collection Time: 05/14/20  9:04 PM   Specimen: Nasopharyngeal  Result Value Ref Range Status   MRSA by PCR POSITIVE (A) NEGATIVE Final    Comment:        The GeneXpert MRSA Assay (FDA approved for NASAL specimens only), is one component of a comprehensive MRSA colonization surveillance program. It is not intended to diagnose MRSA infection nor to guide or monitor treatment for MRSA infections. RESULT CALLED TO, READ BACK BY AND VERIFIED WITH: MELISSA COBB AT [phone] 05/15/20.PMF Performed at [facility], [address] [place], [place] [address]   Culture, respiratory     Status: None   Collection Time: 05/22/20 11:26 AM   Specimen: Tracheal Aspirate; Respiratory  Result Value Ref Range Status   Specimen Description   Final    TRACHEAL ASPIRATE Performed at [facility], [address] [place], [place] [address]    Special Requests   Final    NONE Performed at [facility], [address] [place], [place] [address]    Gram Stain   Final    RARE WBC PRESENT, PREDOMINANTLY PMN NO ORGANISMS SEEN    Culture   Final    NO GROWTH 2 DAYS Performed at [facility], [place] [address], [place], [place] [address]    Report Status 05/24/2020 FINAL  Final    Coagulation Studies: No results for input(s): LABPROT, INR in the last 72 hours.  Urinalysis: No results for input(s): COLORURINE, LABSPEC, PHURINE, GLUCOSEU, HGBUR, BILIRUBINUR, KETONESUR, PROTEINUR, UROBILINOGEN, NITRITE, LEUKOCYTESUR in the last 72 hours.  Invalid input(s): APPERANCEUR    Imaging: No results found.   Medications:   . sodium chloride Stopped (05/20/20  2308)  . dexmedetomidine (PRECEDEX) IV infusion 0.6 mcg/kg/hr (2020/06/01 0615)  . feeding supplement (VITAL AF 1.2 CAL) 65 mL/hr at 05/30/20 2000  . HYDROmorphone 1 mg/hr (Jun 01, 2020 0600)  . propofol (DIPRIVAN) infusion 30 mcg/kg/min (06/01/20 0612)   . chlorhexidine gluconate (MEDLINE KIT)  15 mL Mouth Rinse BID  . Chlorhexidine Gluconate Cloth  6 each Topical Q0600  . diazepam  5 mg Per Tube Q8H  . feeding supplement (PROSource TF)  45 mL Per Tube BID  . free water  300 mL Per Tube Q4H  . insulin aspart  0-20 Units Subcutaneous Q4H  . insulin aspart  8 Units Subcutaneous Q4H  . insulin detemir  44 Units Subcutaneous Q12H  . mouth rinse  15 mL Mouth Rinse 10 times per day  . pantoprazole sodium  40 mg Per Tube Daily  . thiamine  100 mg Per Tube Daily   sodium chloride, acetaminophen (TYLENOL) oral liquid 160 mg/5 mL, acetaminophen, dextrose, docusate, hydrALAZINE, ipratropium-albuterol, midazolam, polyethylene glycol  Assessment/ Plan:    Billy Barton is a 76 y.o. white male with coronary artery disease, diabetes mellitus type 2, history of AICD placement who was admitted to [facility] on 05/24/2020 for DKA, type 2 ([place]) [E11.10] Altered mental status, unspecified altered mental status type [R41.82] Gastrointestinal hemorrhage, unspecified gastrointestinal hemorrhage type [K92.2] Sepsis, due to unspecified organism, unspecified whether acute  organ dysfunction present ([place]) [A41.9] Hyperosmolar hyperglycemic state (HHS) ([place]) [E11.00, E11.65]  Patient found to have MSSA bacteremia and diabetic ketoacisosis. Patient required PRBC transfusions and was found to have a GI bleed. Placed on IV pantoprazole and endoscopy deferred. Patient was in atrial flutter. He has an AICD. He converted to sinus rhythm. TEE was performed on 1/17 with no vegetations. Weaned off vasopressors. Nephrology consulted for acute renal failure. Admission creatinine of 4.34. There is no baseline creatinine available.     1.  Acute kidney injury, baseline creatinine currently unknown. Renal ultrasound consistent with chronic kidney disease.  Proteinuria and glycosuria consistent with diabetic nephropathy.  Nonoliguric urine output.  01/30 0701 - 01/31 0700 In: 3545.5 [I.V.:733.5; NG/GT:2812] Out: 7341 [Urine:1550; Stool:100]  Lab Results  Component Value Date   CREATININE 2.73 (H) 2020-06-03   CREATININE 2.58 (H) 05/30/2020   CREATININE 2.52 (H) 05/29/2020   Creatinine trends are worsening  3.  Hypernatremia. Free water deficit improving.   - Continue free water flushes   4.  Acute respiratory failure secondary to MRSA pneumonia : tracheostomy will be required for long term care.   5. Sepsis with MSSA and proteus mirabilis bacteremia. - Appreciate ID input. Completed antibiotics.   6. Anemia of kidney failure:  Lab Results  Component Value Date   HGB 8.3 (L) 06-03-20     - stable. No more melana reported. Normocytic.   7. Dependent edema 3rd spacing of fluid Albumin low at 1.6 on 1/18 Will supplement     LOS: [place] 06-03-2208:32 AM

## 2020-06-01 NOTE — Progress Notes (Signed)
Patient ID: Billy Barton, male   DOB: 28-Feb-1946, 75 y.o.   MRN: [id number]  Chart reviewed. Pt is Comfort care/DNR/DNI TRH will assume care 06/01/20

## 2020-06-01 NOTE — Consult Note (Signed)
Consultation Note Date: 06/24/20   Patient Name: Billy Barton  DOB: [birth year]  MRN: [id number]  Age / Sex: 75 y.o., male  PCP: Patient, No Pcp Per Referring Physician: Flora Lipps, MD  Reason for Consultation: Establishing goals of care  HPI/Patient Profile: 75 y.o. male  with past medical history of DM, CVA, ETOH use disorder,  admitted on 05/25/2020 after being found down for an unknown amount of time by his son at home. Workup revealed DKA, sepsis, MSSA bacteremia, GI bleed, and worsening renal function. He has been intubated and is non responsive. EEG showed severe diffuse encephalopathy. Vent asynchrony 1/27 worrisome for nuerogenic breathing pattern indicating significant neurologic deficit. Palliative consulted for [place].     Clinical Assessment and Goals of Care:  I have reviewed medical records including EPIC notes, labs and imaging, examined the patient and called patient's son, Billy Barton, to discuss diagnosis prognosis, GOC, EOL wishes, disposition and options.  I introduced Palliative Medicine as specialized medical care for people living with serious illness. It focuses on providing relief from the symptoms and stress of a serious illness.   We discussed a brief life review of the patient.  He used to be a Estate agent.  He is a English as a second language teacher.  He was married but his spouse unfortunately died a few years ago.  His son noted that he seemed to spiral after the death of his spouse.  He moved to [place] to be close to his son a few years ago.  As far as functional and nutritional status -prior to this admission he was living alone at home.  He did require transportation to appointments and his son would drive him to the grocery store.  He had some physical disability from his previous stroke.  We discussed his current illness and what it means in the larger context of his on-going co-morbidities.  Natural disease  trajectory and expectations at EOL were discussed.  Billy Barton notes that patient has a living will indicating he would not want prolonged artificial life support.  I attempted to elicit values and goals of care important to the patient.  He is known for being very independent.  He has never wanted to be reliant on anyone for care.  The difference between aggressive medical intervention and comfort care was presented and discussed in light of the patient's goals of care.   Billy Barton clearly states that patient would not want prolonged artificial life support, and that his preference would be to extubate patient and transition patient to full comfort care.  He understands that comfort care means no further life prolonging measures, and that patient will be supported with symptom management through a dying process.  Unfortunately, Billy Barton is immunocompromised and cannot come to the hospital.  He does not wish for extubation to be delayed.  Questions and concerns were addressed.  The family was encouraged to call with questions or concerns.    Primary Decision Maker HCPOA- son- Billy Barton    SUMMARY OF RECOMMENDATIONS -DNR -Full comfort measures only -Extubate for  comfort- please continue current propofol, precedex, dilaudid and versed- increased frequency of versed and added dialudid bolus for comfort -Other comfort medications as ordered     Code Status/Advance Care Planning:  DNR  Additional Recommendations (Limitations, Scope, Preferences):  Full Comfort Care  Prognosis:    Hours - Days  Discharge Planning: Anticipated Hospital Death  Primary Diagnoses: Present on Admission: . DKA, type 2 ([place])   I have reviewed the medical record, interviewed the patient and family, and examined the patient. The following aspects are pertinent.  Past Medical History:  Diagnosis Date  . AICD (automatic cardioverter/defibrillator) present    a. 03/23/2015 s/p MDT DC AICD - placed @ [place], [place].  [place] [facility] CAD (coronary artery disease)    a. report of MI - unclear of procedures performed.  . Insulin dependent type 2 diabetes mellitus ([place])    Social History   Socioeconomic History  . Marital status: Widowed    Spouse name: Not on file  . Number of children: Not on file  . Years of education: Not on file  . Highest education level: Not on file  Occupational History  . Not on file  Tobacco Use  . Smoking status: Unknown If Ever Smoked  . Smokeless tobacco: Not on file  Substance and Sexual Activity  . Alcohol use: Not Currently  . Drug use: Not on file  . Sexual activity: Not on file  Other Topics Concern  . Not on file  Social History Narrative   Was living in [place] - homeless.  Son moved him up to [place] ~ 2-3 yrs ago.  Pt lives by himself but son nearby and w/ close/frequent contact.   Social Determinants of Health   Financial Resource Strain: Not on file  Food Insecurity: Not on file  Transportation Needs: Not on file  Physical Activity: Not on file  Stress: Not on file  Social Connections: Not on file   Scheduled Meds: . chlorhexidine gluconate (MEDLINE KIT)  15 mL Mouth Rinse BID  . Chlorhexidine Gluconate Cloth  6 each Topical Q0600  . free water  300 mL Per Tube Q4H   Continuous Infusions: . sodium chloride Stopped (05/20/20 2308)  . dexmedetomidine (PRECEDEX) IV infusion 0.6 mcg/kg/hr (06/05/2020 0615)  . HYDROmorphone 1 mg/hr (2020/06/05 0600)  . propofol (DIPRIVAN) infusion 30 mcg/kg/min (06/05/2020 1459)   PRN Meds:.sodium chloride, acetaminophen (TYLENOL) oral liquid 160 mg/5 mL, acetaminophen, diphenhydrAMINE, glycopyrrolate **OR** glycopyrrolate **OR** glycopyrrolate, HYDROmorphone, midazolam, polyvinyl alcohol Medications Prior to Admission:  Prior to Admission medications   Not on File   Not on File Review of Systems  Unable to perform ROS   Physical Exam Vitals and nursing note reviewed.  Constitutional:       Appearance: He is ill-appearing.  Pulmonary:     Comments: intubated Neurological:     Sensory: Sensory deficit present.     Comments: nonresponsive to all stimuli     Vital Signs: BP (!) 170/62   Pulse 90   Temp 98.24 F (36.8 C)   Resp 16   Ht 5' 7.99" (1.727 m)   Wt 106.5 kg   SpO2 92%   BMI 35.71 kg/m  Pain Scale: CPOT   Pain Score: 0-No pain   SpO2: SpO2: 92 % O2 Device:SpO2: 92 % O2 Flow Rate: .   IO: Intake/output summary:   Intake/Output Summary (Last 24 hours) at 2020/06/05 1534 Last data filed at June 05, 2020 1357 Gross per 24 hour  Intake 3750.69 ml  Output 2250 ml  Net 1500.69 ml    LBM: Last BM Date: 06-13-2020 Baseline Weight: Weight: 127 kg Most recent weight: Weight: 106.5 kg     Palliative Assessment/Data: PPS: 10%     Thank you for this consult. Palliative medicine will continue to follow and assist as needed.   Time In: 1406 Time Out: 1552 Time Total: 106 mins Greater than 50%  of this time was spent counseling and coordinating care related to the above assessment and plan.  Signed by: Mariana Kaufman, AGNP-C Palliative Medicine    Please contact Palliative Medicine Team phone at [phone] for questions and concerns.  For individual provider: See Shea Evans

## 2020-06-01 NOTE — Death Summary Note (Signed)
DEATH SUMMARY   Patient Details  Name: Billy FreshwaterRobert Barton MRN: [id number] DOB: 1946/03/11  Admission/Discharge Information   Admit Date:  05/05/2020  Date of Death: Date of Death: 05/30/2020  Time of Death: Time of Death: 1643  Length of Stay: 19  Referring Physician: Patient, No Pcp Per   Reason(s) for Hospitalization  DKA, MRSA PNEUMONIA, MSSA PNEUMONIA  Diagnoses  Preliminary cause of death:  DKA, MRSA PNEUMONIA, MSSA PNEUMONIA, PROTEUS BACTEREMIA Secondary Diagnoses (including complications and co-morbidities):  Active Problems:   DKA, type 2 (HCC)   Bacteremia   AICD (automatic cardioverter/defibrillator) present   Atrial flutter (HCC)   Gastrointestinal hemorrhage   Acute respiratory failure with hypoxia (HCC)   AKI (acute kidney injury) (HCC)   Altered mental status   Sepsis [facility](HCC)   Brief Hospital Course (including significant findings, care, treatment, and services provided and events leading to death)  75yo male w/unknown past medical history who presented to the ED via EMS after the son found him unresponsive, face down in the kitchen, incontinent of urine. On arrival to the ED, the patient was arousable only to painful stimuli, hypothermic 66F. Per the ED, the patient son reported to them they call and check on him daily and last spoke withpatient day prior to admission. When EMS arrived, the patient was awake but became unresponsive en route to the ED. Was found to be in DKA vs HSS with a blood sugar >600 and lactate 5.8  EVENTS  05/14/20- patient with MSSA bacteremia, critially ill on mechanical ventilator. Remains on vasopressor. Insulin gtt is off today. Active GI bleed s/p PRBC transfusion. 05/15/20-patient remains hypotensive on MV. Possible EGD when more stable - spoke with GI- Dr Lacretia NicksW today. Still with edema 2+ on vasopressors.  05/16/20- patient on MV. A line not functioning will dc today. Pt in sinus rhythm remains intubated septic awaiting tee for  endocarditis workup.  08-04-20 - TEE performed at bedside.  05/18/20 - not waking up off sedation.Nephrologyconsultedworsening renal function 05/22/2020 - remains unresponsive off all sedation, secretions from ETT are heavy, will repeat tracheal aspirate. FiO2 is down to 30%, patient intermittetly biting on ETT during examination. He is rhonchorous bilaterally on auscultation. Overnight patient had non bloody vomitus and OGT feeds have been held today. UOP overnight is adequate. 1/23patient with severe neurological deficit 1/24increased WOB, on vent 1/25 severe resp distress, near cardiac arrest 1/26severe resp failure 1/27 ventasynchrony, sedation changed 1/30no significant change, unresponsive   75 yo white male with acute MRSA pneumonia and MSSA bacteremia PROTEUS BACTEREMIAwith severehyperosmolar nonketotic stateand acidosisdue to sepsisleading to severe and acute hypoxic resp failure with failure to wean from vent and inability to protect airwaydue tosevere encephalopathy.  Severe ACUTE Hypoxic and Hypercapnic Respiratory Failure -continue Full MV support -continue Bronchodilator Therapy -Wean Fio2 and PEEP as tolerated -VAP/VENT bundle implementation  ACUTE SYSTOLIC CARDIAC FAILURE- EF -oxygen as needed -Lasix as tolerated  Morbid obesity, possible OSA.  Will certainly impact respiratory mechanics, ventilator weaning Suspect will need to consider additional PEEP  ACUTE KIDNEY INJURY/Renal Failure -continue Foley Catheter-assess need -Avoid nephrotoxic agents -Follow urine output, BMP -Ensure adequate renal perfusion, optimize oxygenation -Renal dose medications    NEUROLOGY Acute toxic metabolic encephalopathy, need for sedation Goal RASS -2 to -3 Unable to obtain MRI Prolonged unresponsiveness Persisitanet vegetative state  CARDIAC ICU monitoring   GI GI PROPHYLAXIS as indicated   DIET-->TF's as tolerated Constipation  protocol as indicated  ENDO - will use ICU hypoglycemic\Hyperglycemia protocol if indicated  ELECTROLYTES -follow labs as needed -replace as needed -pharmacy consultation and following   DVT/GI PRX ordered and assessed TRANSFUSIONS AS NEEDED MONITOR FSBS I Assessed the need for Labs I Assessed the need for Foley I Assessed the need for Central Venous Line Family Discussion when available I Assessed the need for Mobilization I made an Assessment of medications to be adjusted accordingly Safety Risk assessment completed    GOALS OF CARE DISCUSSION  Family understands the situation.  They have consented and agreed to DNR/DNI and would like to proceed with Comfort care measures.  Family are satisfied with Plan of action and management. All questions answered  Patient died at 1643 07-Jun-2020    Pertinent Labs and Studies  Significant Diagnostic Studies EEG  Result Date: 05/27/2020 Charlsie Quest, MD     05/27/2020  7:02 PM Patient Name: Billy Barton MRN: [id number] Epilepsy Attending: Charlsie Quest Referring Physician/Provider: Dr Campbell Riches Date: 05/27/2020 Duration: 24.11 mins Patient history: 75yo M with ams. EEG to evaluate for seizure Level of alertness: comatose AEDs during EEG study: Valium Technical aspects: This EEG study was done with scalp electrodes positioned according to the 10-20 International system of electrode placement. Electrical activity was acquired at a sampling rate of 500Hz  and reviewed with a high frequency filter of 70Hz  and a low frequency filter of 1Hz . EEG data were recorded continuously and digitally stored. Description: EEG showed continuous generalized 2-3hz  delta as well as 4-5hz  theta slowing. Hyperventilation and photic stimulation were not performed.   ABNORMALITY -Continuous slow, generalized IMPRESSION: This study is suggestive of severe diffuse encephalopathy, nonspecific etiology. No seizures or epileptiform discharges were  seen throughout the recording. Charlsie Quest   EEG  Result Date: 05/19/2020 Charlsie Quest, MD     05/19/2020  3:04 PM Patient Name: Billy Barton MRN: [id number] Epilepsy Attending: Charlsie Quest Referring Physician/Provider: Dr. Christiane Ha devDewald Date: 05/19/2020 Duration: 24.08 mins Patient history: 75 year old male with altered mental status.  EEG to evaluate for seizures. Level of alertness: Comatose AEDs during EEG study: None Technical aspects: This EEG study was done with scalp electrodes positioned according to the 10-20 International system of electrode placement. Electrical activity was acquired at a sampling rate of 500Hz  and reviewed with a high frequency filter of 70Hz  and a low frequency filter of 1Hz . EEG data were recorded continuously and digitally stored. Description: EEG showed continuous generalized predominantly 6 to 7 Hz theta as well as intermittent 2 to 3 Hz generalized delta slowing. Hyperventilation and photic stimulation were not performed.   ABNORMALITY -Continuous slow, generalized IMPRESSION: This study is suggestive of severe diffuse encephalopathy, nonspecific etiology. No seizures or epileptiform discharges were seen throughout the recording. Charlsie Quest   DG Abd 1 View  Result Date: 05/19/2020 CLINICAL DATA:  Orogastric tube placement EXAM: ABDOMEN - 1 VIEW COMPARISON:  Portable exam 1105 hours compared to 05/13/2020 FINDINGS: Tip of orogastric tube projects over mid stomach. Nonobstructive bowel gas pattern. No acute osseous findings. IMPRESSION: Tip of orogastric tube projects over mid stomach. Electronically Signed   By: Ulyses Southward M.D.   On: 05/21/2020 11:21   DG Abd 1 View  Result Date: 05/13/2020 CLINICAL DATA:  Orogastric tube placement EXAM: ABDOMEN - 1 VIEW COMPARISON:  None. FINDINGS: Tip and side port of the orogastric tube project within the stomach. No dilated small bowel is visible. IMPRESSION: Orogastric tube tip and side port in the stomach.  Electronically Signed   By: Chrisandra Netters.D.  On: 05/13/2020 03:46   CT HEAD WO CONTRAST  Result Date: 05/20/2020 CLINICAL DATA:  Mental status change EXAM: CT HEAD WITHOUT CONTRAST TECHNIQUE: Contiguous axial images were obtained from the base of the skull through the vertex without intravenous contrast. COMPARISON:  CT head 05/13/2020 FINDINGS: Brain: Motion degraded study.  Multiple repeat images. Moderate atrophy. Extensive chronic microvascular ischemic change throughout the cerebral white matter. 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 Vascular: Negative for hyperdense vessel Skull: Negative Sinuses/Orbits: Mucosal edema paranasal sinuses. Air-fluid level right frontal sinus and right maxillary sinus Other: None IMPRESSION: Motion degraded study No acute abnormality Extensive chronic microvascular ischemic change. Electronically Signed   By: Marlan Palau M.D.   On: 05/20/2020 13:08   CT HEAD WO CONTRAST  Result Date: 05/13/2020 CLINICAL DATA:  75 year old male status post cardiopulmonary arrest. Altered mental status. EXAM: CT HEAD WITHOUT CONTRAST TECHNIQUE: Contiguous axial images were obtained from the base of the skull through the vertex without intravenous contrast. COMPARISON:  Head CT yesterday. 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 Patchy and confluent bilateral cerebral white matter hypodensity appears stable, including suspected chronic white matter encephalomalacia in the left PCA territory. Gray-white matter differentiation elsewhere is stable, including mild heterogeneity also in the left thalamus. Stable ventricle size, sulci. No midline shift, mass effect, evidence of mass lesion, intracranial hemorrhage or evidence of cortically based acute infarction. Vascular: Calcified atherosclerosis at the skull base. Presumed calcified atherosclerosis along the proximal ACAs, stable. No suspicious intracranial  vascular hyperdensity. Skull: No acute osseous abnormality identified. Sinuses/Orbits: Increased ethmoid and sphenoid sinus mucosal thickening and small fluid levels. Tympanic cavities and mastoids remain clear. Other: Intubated on the scout view. Small volume fluid in the nasopharynx. No acute orbit or scalp soft tissue finding. IMPRESSION: Stable non contrast CT appearance of the brain. No CT evidence of anoxic injury. 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 Electronically Signed   By: Odessa Fleming M.D.   On: 05/13/2020 04:45   CT Head Wo Contrast  Result Date: May 22, 2020 CLINICAL DATA:  Mental status change, found unresponsive 1 hour prior to admission EXAM: CT HEAD WITHOUT CONTRAST TECHNIQUE: Contiguous axial images were obtained from the base of the skull through the vertex without intravenous contrast. COMPARISON:  None. FINDINGS: Brain: Encephalomalacia in the right thalamus compatible sequela prior remote infarct. No evidence of acute infarction, hemorrhage, hydrocephalus, extra-axial collection, visible mass lesion or mass effect. Symmetric prominence of the ventricles, cisterns and sulci compatible with moderate parenchymal volume loss and ex vacuo dilatation of the ventricles. Extensive patchy and confluent areas of white matter hypoattenuation are most compatible with chronic microvascular angiopathy. Vascular: Hyperdensity in the region of the anterior cerebral arteries along the inter hemispheric fissure (3/11) could reflect some chronic calcified plaque or thrombus. No other concerning hyperdense vessels. Atherosclerotic plaque in the carotid siphons. Skull: Evaluation of the skull base limited by motion artifact despite multiple attempts at acquisition. Mild left frontoparietal scalp swelling. No calvarial fracture or suspicious osseous lesion. Sinuses/Orbits: Paranasal sinuses and mastoid air cells are predominantly clear. Included orbital structures are unremarkable. Other: None  IMPRESSION: 1. Hyperdensity in the region of the anterior cerebral arteries along the inter hemispheric fissure could reflect some chronic calcified plaque or possibly remote calcified thrombus. 2. Mild left frontoparietal scalp swelling. No calvarial fracture. 3. No other acute intracranial abnormality. 4. 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 5. Moderate parenchymal volume  loss and chronic microvascular angiopathy. Electronically Signed   By: Kreg Shropshire M.D.   On: 05/16/2020 23:37   US RENAL  Result Date: 05/18/2020 CLINICAL DATA:  Acute kidney injury EXAM: RENAL / URINARY TRACT ULTRASOUND COMPLETE COMPARISON:  None. FINDINGS: Right Kidney: Renal measurements: 10.9 x 6.3 x 5.8 cm = volume: 206 mL. Mild diffuse thinning of renal cortex. No mass. No hydronephrosis. Left Kidney: Renal measurements: 10.9 x 5.3 x 4.5 cm = volume: 135 mL. Mild diffuse thinning of renal cortex. No hydronephrosis. 2.3 cm simple cyst seen in the mid left kidney. Bladder: Limited evaluation due to collapse configuration.  Foley in place. Other: None. IMPRESSION: Mild diffuse thinning of renal cortex consistent with chronic medical renal disease. Electronically Signed   By: Acquanetta Belling M.D.   On: 05/18/2020 10:32   DG Chest Port 1 View  Result Date: 05/26/2020 CLINICAL DATA:  Respiratory failure.  Intubation. EXAM: PORTABLE CHEST 1 VIEW COMPARISON:  05/24/2020. FINDINGS: Endotracheal tube, NG tube, right IJ line in stable position. Cardiac pacer stable position. Cardiomegaly with mild pulmonary venous congestion. Low lung volumes with mild bibasilar atelectasis and infiltrates/edema. No pleural effusion. No pneumothorax. IMPRESSION: 1. Lines and tubes in stable position. 2. Cardiomegaly with mild pulmonary venous congestion. 3. Low lung volumes with mild bibasilar atelectasis and infiltrates/edema. Electronically Signed   By: Maisie Fus  Register   On: 05/26/2020 07:56   DG Chest Port 1  View  Result Date: 05/24/2020 CLINICAL DATA:  Acute respiratory failure EXAM: PORTABLE CHEST 1 VIEW COMPARISON:  May 22, 2020 FINDINGS: The endotracheal tube terminates above the carina. The left-sided pacemaker/ICD is unchanged in positioning. The right-sided central venous catheter is unchanged in positioning. There is no pneumothorax. The enteric tube extends below the left hemidiaphragm. There is no large pleural effusion. There is improved retrocardiac aeration. There is atelectasis at the lung bases. IMPRESSION: 1. Lines and tubes as above. 2. Improved aeration in the lung bases. Electronically Signed   By: Katherine Mantle M.D.   On: 05/24/2020 21:49   DG Chest Port 1 View  Result Date: 05/22/2020 CLINICAL DATA:  Vomiting EXAM: PORTABLE CHEST 1 VIEW COMPARISON:  05/19/2020 FINDINGS: Endotracheal tube seen 4.6 cm above the carina. Nasogastric tube extends into the upper abdomen beyond the margin of the examination. Right internal jugular central venous catheter tip noted at the superior cavoatrial junction. Progressive retrocardiac opacification may relate to atelectasis or developing infiltrate within this region. The lungs are otherwise clear. No pneumothorax or pleural effusion. Cardiac size within normal limits. 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 The pulmonary vascularity is normal. IMPRESSION: Stable support lines and tubes. Progressive retrocardiac opacification, possibly representing developing atelectasis or infiltrate within this region. Electronically Signed   By: Helyn Numbers MD   On: 05/22/2020 06:01   DG Chest Port 1 View  Result Date: 05/19/2020 CLINICAL DATA:  Acute respiratory failure. EXAM: PORTABLE CHEST 1 VIEW COMPARISON:  Chest x-ray 05/29/2020. FINDINGS: Limited exam, hemidiaphragms not completely imaged. Endotracheal tube, right IJ line in stable position. NG tube tip below the level of the image. Cardiac pacer in stable position. Cardiomegaly. No  pulmonary venous congestion. Persistent left base infiltrate noted. No prominent pleural effusion. No pneumothorax. IMPRESSION: 1. Lines and tubes in stable position. 2. Cardiac pacer in stable position. Stable cardiomegaly. 3. Persistent left base infiltrate. Electronically Signed   By: Maisie Fus  Register   On: 05/19/2020 06:15   DG Chest Port 1 View  Result Date: 05/21/2020 CLINICAL DATA:  Acute  respiratory failure EXAM: PORTABLE CHEST 1 VIEW COMPARISON:  May 15, 2020 FINDINGS: Evaluation is limited by patient positioning. Portions of the lower thorax were not visualized on this study. The support devices appear essentially stable. There are persistent bilateral airspace opacities with a dense retrocardiac opacity. There is no pneumothorax. No definite acute osseous abnormality. IMPRESSION: No significant interval change. Electronically Signed   By: Katherine Mantle M.D.   On: 05/30/2020 04:28   DG Chest Port 1 View  Result Date: 05/15/2020 CLINICAL DATA:  Acute respiratory failure EXAM: PORTABLE CHEST 1 VIEW COMPARISON:  05/14/2020 FINDINGS: There is a persistent dense retrocardiac opacity which may represent a combination of atelectasis and a left-sided pleural effusion. There is a left-sided ICD/pacemaker in place. The right-sided central venous catheter is stable in positioning. The enteric tube extends below the left hemidiaphragm. The endotracheal tube terminates above the carina. Perihilar airspace opacities are noted which have worsened since the prior study. IMPRESSION: 1. Lines and tubes as above. 2. Persistent dense retrocardiac opacity. Worsening perihilar airspace opacities. Electronically Signed   By: Katherine Mantle M.D.   On: 05/15/2020 06:02   DG Chest Port 1 View  Result Date: 05/14/2020 CLINICAL DATA:  Altered mental status EXAM: PORTABLE CHEST 1 VIEW COMPARISON:  05/13/2020 FINDINGS: Endotracheal tube tip is about 3.9 cm superior to carina. Esophageal tube tip below the  diaphragm but incompletely visualized. Right IJ central venous catheter tip over the SVC. Left-sided pacing device as before. Cardiomegaly. Worsening consolidation/airspace disease at the left base. No pneumothorax IMPRESSION: Worsening consolidation/airspace disease at the left base. Endotracheal tube tip about 3.9 cm superior to carina. Electronically Signed   By: Jasmine Pang M.D.   On: 05/14/2020 18:27   DG Chest Port 1 View  Result Date: 05/13/2020 CLINICAL DATA:  Intubation EXAM: PORTABLE CHEST 1 VIEW COMPARISON:  05/07/2020 FINDINGS: Support Apparatus: --Endotracheal tube: Tip just below the level of the clavicular heads. --Enteric tube:Tip and sideport project in the stomach. --Catheter(s):Right internal jugular vein approach central venous catheter tip is at the cavoatrial junction. --Other: Unchanged position of left chest wall AICD leads. The heart size and mediastinal contours are within normal limits. The lungs are clear. No pleural effusion or pneumothorax. IMPRESSION: Endotracheal tube tip just below the level of the clavicular heads. Electronically Signed   By: Deatra Robinson M.D.   On: 05/13/2020 03:48   DG Chest Port 1 View  Result Date: 05/28/2020 CLINICAL DATA:  Questionable sepsis - evaluate for abnormality Found unresponsive. EXAM: PORTABLE CHEST 1 VIEW COMPARISON:  None. FINDINGS: Dual lead left-sided pacemaker in place with leads projecting over the right atrium and ventricle. Lung volumes are low. Borderline cardiomegaly with normal mediastinal contours for technique. No pulmonary edema. No focal airspace disease. Subsegmental atelectasis at the bases. No pleural fluid or pneumothorax. IMPRESSION: Low lung volumes with borderline cardiomegaly. Left-sided pacemaker in place. Electronically Signed   By: Narda Rutherford M.D.   On: 05/13/2020 22:25   DG Abd Portable 1V  Result Date: 05/23/2020 CLINICAL DATA:  Ileus. EXAM: PORTABLE ABDOMEN - 1 VIEW COMPARISON:  Abdominal radiograph  05/19/2020. FINDINGS: Enteric tube tip and side-port project over the stomach. Gas is demonstrated within nondilated loops of large and small bowel within the abdomen. Supine evaluation limited for the detection of free intraperitoneal air. IMPRESSION: Nonobstructive bowel gas pattern. Electronically Signed   By: Annia Belt M.D.   On: 05/23/2020 10:33   ECHOCARDIOGRAM COMPLETE  Result Date: 05/14/2020    ECHOCARDIOGRAM REPORT  Patient Name:   DENSON NICCOLI Date of Exam: 05/14/2020 Medical Rec #:  [id number]    Height:       68.0 in Accession #:    [id number]   Weight:       242.7 lb Date of Birth:  [birth year]    BSA:          2.219 m Patient Age:    74 years     BP:           124/54 mmHg Patient Gender: M            HR:           62 bpm. Exam Location:  [facility] Procedure: 2D Echo, Cardiac Doppler and Color Doppler Indications:     Bacteremia R78.81  History:         Patient has no prior history of Echocardiogram examinations.                  Defibrillator.  Sonographer:     [name] RDCS (AE) Referring Phys:  [id number] A [facility] Diagnosing Phys: Lorine Bears MD  Sonographer Comments: Technically difficult study due to poor echo windows, no apical window and echo performed with patient supine and on artificial respirator. IMPRESSIONS  1. Left ventricular ejection fraction, by estimation, is 50 to 55%. The left ventricle has low normal function. Left ventricular endocardial border not optimally defined to evaluate regional wall motion. There is mild left ventricular hypertrophy. Left ventricular diastolic parameters are indeterminate.  2. Right ventricular systolic function is normal. The right ventricular size is normal. Tricuspid regurgitation signal is inadequate for assessing PA pressure.  3. The mitral valve is normal in structure. No evidence of mitral valve regurgitation. No evidence of mitral stenosis.  4. The aortic valve is normal in structure. Aortic valve regurgitation is not visualized. No aortic  stenosis is present.  5. Aortic dilatation noted. There is moderate dilatation of the ascending aorta, measuring 45 mm.  6. The inferior vena cava is dilated in size with >50% respiratory variability, suggesting right atrial pressure of 8 mmHg.  7. RV pacemaker lead is thickend. Can not rule out vegetation. Consider a TEE  8. Challenging image quality FINDINGS  Left Ventricle: Left ventricular ejection fraction, by estimation, is 50 to 55%. The left ventricle has low normal function. Left ventricular endocardial border not optimally defined to evaluate regional wall motion. The left ventricular internal cavity  size was normal in size. There is mild left ventricular hypertrophy. Left ventricular diastolic parameters are indeterminate. Right Ventricle: The right ventricular size is normal. No increase in right ventricular wall thickness. Right ventricular systolic function is normal. Tricuspid regurgitation signal is inadequate for assessing PA pressure. Left Atrium: Left atrial size was normal in size. Right Atrium: Right atrial size was normal in size. Pericardium: There is no evidence of pericardial effusion. Mitral Valve: The mitral valve is normal in structure. No evidence of mitral valve regurgitation. No evidence of mitral valve stenosis. Tricuspid Valve: The tricuspid valve is normal in structure. Tricuspid valve regurgitation is not demonstrated. No evidence of tricuspid stenosis. Aortic Valve: The aortic valve is normal in structure. Aortic valve regurgitation is not visualized. No aortic stenosis is present. Pulmonic Valve: The pulmonic valve was normal in structure. Pulmonic valve regurgitation is not visualized. No evidence of pulmonic stenosis. Aorta: Aortic dilatation noted. There is moderate dilatation of the ascending aorta, measuring 45 mm. Venous: The inferior vena cava is dilated in size with greater  than 50% respiratory variability, suggesting right atrial pressure of 8 mmHg. IAS/Shunts: No  atrial level shunt detected by color flow Doppler. Additional Comments: A pacer wire is visualized.  LEFT VENTRICLE PLAX 2D LVIDd:         4.20 cm LVIDs:         3.00 cm LV PW:         1.60 cm LV IVS:        1.50 cm LVOT diam:     2.30 cm LVOT Area:     4.15 cm  LEFT ATRIUM         Index LA diam:    4.50 cm 2.03 cm/m                        PULMONIC VALVE AORTA                 PV Vmax:        0.58 m/s Ao Root diam: 4.57 cm PV Peak grad:   1.3 mmHg                       RVOT Peak grad: 2 mmHg   SHUNTS Systemic Diam: 2.30 cm Lorine Bears MD Electronically signed by Lorine Bears MD Signature Date/Time: 05/14/2020/4:51:35 PM    Final    ECHO TEE  Result Date: 05/08/2020    TRANSESOPHOGEAL ECHO REPORT   Patient Name:   LUCAH PETTA Date of Exam: 05/09/2020 Medical Rec #:  [id number]    Height:       68.0 in Accession #:    [id number]   Weight:       241.6 lb Date of Birth:  1945-09-22    BSA:          2.215 m Patient Age:    74 years     BP:           123/63 mmHg Patient Gender: M            HR:           92 bpm. Exam Location:  [facility] Procedure: Transesophageal Echo, Color Doppler and Cardiac Doppler Indications:     Not listed on TEE check-in  History:         Patient has prior history of Echocardiogram examinations, most                  recent 05/14/2020. CAD; Risk Factors:Diabetes. AICD.  Sonographer:     [name] RDCS (AE) Referring Phys:  [id number] Dois Davenport BERGE Diagnosing Phys: Lorine Bears MD PROCEDURE: The transesophogeal probe was passed without difficulty through the esophogus of the patient. Sedation performed by performing physician. Image quality was good. The patient's vital signs; including heart rate, blood pressure, and oxygen saturation; remained stable throughout the procedure. The patient developed no complications during the procedure. IMPRESSIONS  1. Left ventricular ejection fraction, by estimation, is 45 to 50%. The left ventricle has mildly decreased function. Left ventricular  endocardial border not optimally defined to evaluate regional wall motion.  2. Right ventricular systolic function is normal. The right ventricular size is normal. Tricuspid regurgitation signal is inadequate for assessing PA pressure.  3. No left atrial/left atrial appendage thrombus was detected.  4. The mitral valve is normal in structure. Mild mitral valve regurgitation. No evidence of mitral stenosis.  5. The aortic valve is normal in structure. Aortic valve regurgitation is not visualized. No aortic stenosis is  present. Conclusion(s)/Recommendation(s): No evidence of vegetation/infective endocarditis on this transesophageal echocardiogram. FINDINGS  Left Ventricle: Left ventricular ejection fraction, by estimation, is 45 to 50%. The left ventricle has mildly decreased function. Left ventricular endocardial border not optimally defined to evaluate regional wall motion. The left ventricular internal cavity size was normal in size. There is no left ventricular hypertrophy. Right Ventricle: The right ventricular size is normal. No increase in right ventricular wall thickness. Right ventricular systolic function is normal. Tricuspid regurgitation signal is inadequate for assessing PA pressure. Left Atrium: Left atrial size was normal in size. No left atrial/left atrial appendage thrombus was detected. Right Atrium: Right atrial size was normal in size. Pericardium: There is no evidence of pericardial effusion. Mitral Valve: The mitral valve is normal in structure. Mild mitral valve regurgitation. No evidence of mitral valve stenosis. Tricuspid Valve: The tricuspid valve is normal in structure. Tricuspid valve regurgitation is mild . No evidence of tricuspid stenosis. Aortic Valve: The aortic valve is normal in structure. Aortic valve regurgitation is not visualized. No aortic stenosis is present. Pulmonic Valve: The pulmonic valve was normal in structure. Pulmonic valve regurgitation is not visualized. No evidence  of pulmonic stenosis. Aorta: The aortic root is normal in size and structure. Venous: The inferior vena cava was not well visualized. IAS/Shunts: No atrial level shunt detected by color flow Doppler. Lorine Bears MD Electronically signed by Lorine Bears MD Signature Date/Time: 05/07/2020/10:29:26 AM    Final     Microbiology Recent Results (from the past 240 hour(s))  Culture, respiratory     Status: None   Collection Time: 05/22/20 11:26 AM   Specimen: Tracheal Aspirate; Respiratory  Result Value Ref Range Status   Specimen Description   Final    TRACHEAL ASPIRATE Performed at [facility], [address] [place], [place] [address]    Special Requests   Final    NONE Performed at [facility], [address] [place], [place] [address]    Gram Stain   Final    RARE WBC PRESENT, PREDOMINANTLY PMN NO ORGANISMS SEEN    Culture   Final    NO GROWTH 2 DAYS Performed at [facility], [address] [place], [place] [address]    Report Status 05/24/2020 FINAL  Final    Lab Basic Metabolic Panel: Recent Labs  Lab 05/27/20 0327 05/28/20 0328 05/29/20 0343 05/30/20 0508 2020-06-14 0444  NA 145 145 147* 147* 150*  K 3.7 4.6 4.4 3.6 3.9  CL 112* 112* 116* 116* 119*  CO2 20* 20* 21* 19* 19*  GLUCOSE 225* 313* 248* 200* 102*  BUN 99* 100* 111* 107* 111*  CREATININE 2.68* 2.57* 2.52* 2.58* 2.73*  CALCIUM 8.1* 7.9* 8.3* 8.6* 8.4*  MG 2.4 2.4 2.6* 2.4 2.5*  PHOS 7.1* 7.6* 5.7* 4.4 7.5*   Liver Function Tests: No results for input(s): AST, ALT, ALKPHOS, BILITOT, PROT, ALBUMIN in the last 168 hours. No results for input(s): LIPASE, AMYLASE in the last 168 hours. No results for input(s): AMMONIA in the last 168 hours. CBC: Recent Labs  Lab 05/27/20 0327 05/28/20 0328 05/29/20 0343 05/30/20 0508 2020-06-14 0444  WBC 9.2 10.3 8.7 11.9* 12.4*  NEUTROABS 7.1 9.1* 7.2 8.2* 9.6*  HGB 8.5* 8.5* 8.2* 9.1* 8.3*  HCT 26.4* 28.2* 27.0* 30.0* 28.0*  MCV  93.6 96.9 96.8 96.8 98.2  PLT 279 348 333 377 303   Cardiac Enzymes: No results for input(s): CKTOTAL, CKMB, CKMBINDEX, TROPONINI in the last 168 hours.  Sepsis Labs: Recent Labs  Lab 05/28/20 0328 05/29/20 0343 05/30/20 0508 05/30/20 1340 2020-06-07 0444  WBC 10.3 8.7 11.9*  --  12.4*  LATICACIDVEN  --   --   --  0.7  --      Wallis Bamberg Alton Bouknight 06-07-20, 5:19 PM

## 2020-06-01 NOTE — Progress Notes (Signed)
CRITICAL CARE NOTE 75yo male w/unknown past medical history who presented to the ED via EMS after the son found him unresponsive, face down in the kitchen, incontinent of urine. On arrival to the ED, the patient was arousable only to painful stimuli, hypothermic 4F. Per the ED, the patient son reported to them they call and check on him daily and last spoke with patient day prior to admission. When EMS arrived, the patient was awake but became unresponsive en route to the ED. Was found to be in DKA vs HSS with a blood sugar >600 and lactate 5.8  EVENTS  05/14/20- patient with MSSA bacteremia, critially ill on mechanical ventilator. Remains on vasopressor. Insulin gtt is off today. Active GI bleed s/p PRBC transfusion. 05/15/20-patient remains hypotensive on MV. Possible EGD when more stable - spoke with GI- Dr Lacretia Nicks today. Still with edema 2+ on vasopressors.  05/16/20- patient on MV. A line not functioning will dc today. Pt in sinus rhythm remains intubated septic awaiting tee for endocarditis workup.  06-16-20 - TEE performed at bedside.  05/18/20 - not waking up off sedation.  Nephrology consulted worsening renal function 05/22/2020 - remains unresponsive off all sedation, secretions from ETT are heavy, will repeat tracheal aspirate. FiO2 is down to 30%, patient intermittetly biting on ETT during examination. He is rhonchorous bilaterally on auscultation. Overnight patient had non bloody vomitus and OGT feeds have been held today. UOP overnight is adequate. 1/23patient with severe neurological deficit 1/24increased WOB, on vent 1/25 severe resp distress, near cardiac arrest 1/26 severe resp failure 1/27 vent asynchrony, sedation changed 1/30 no significant change, unresponsive     CC  follow up respiratory failure  SUBJECTIVE Patient remains critically ill Prognosis is guarded  Vent Mode: PRVC FiO2 (%):  [24 %-50 %] 40 % Set Rate:  [10 bmp-12 bmp] 12 bmp Vt Set:  [500 mL]  500 mL PEEP:  [5 cmH20] 5 cmH20 Plateau Pressure:  [16 cmH20-19 cmH20] 16 cmH20  CBC    Component Value Date/Time   WBC 12.4 (H) 05/18/2020 0444   RBC 2.85 (L) 05/22/2020 0444   HGB 8.3 (L) 05/20/2020 0444   HCT 28.0 (L) 05/09/2020 0444   PLT 303 05/25/2020 0444   MCV 98.2 05/03/2020 0444   MCH 29.1 05/06/2020 0444   MCHC 29.6 (L) 05/25/2020 0444   RDW 17.7 (H) 05/22/2020 0444   LYMPHSABS 1.3 05/07/2020 0444   MONOABS 0.9 05/29/2020 0444   EOSABS 0.5 05/13/2020 0444   BASOSABS 0.0 05/08/2020 0444   BMP Latest Ref Rng & Units 05/05/2020 05/30/2020 05/29/2020  Glucose 70 - 99 mg/dL 397(Q) 734(L) 937(T)  BUN 8 - 23 mg/dL 024(O) 973(Z) 329(J)  Creatinine 0.61 - 1.24 mg/dL 2.42(A) 8.34(H) 9.62(I)  Sodium 135 - 145 mmol/L 150(H) 147(H) 147(H)  Potassium 3.5 - 5.1 mmol/L 3.9 3.6 4.4  Chloride 98 - 111 mmol/L 119(H) 116(H) 116(H)  CO2 22 - 32 mmol/L 19(L) 19(L) 21(L)  Calcium 8.9 - 10.3 mg/dL 2.9(N) 9.8(X) 8.3(L)    BP (!) 108/55   Pulse 73   Temp (!) 95 F (35 C)   Resp 15   Ht 5' 7.99" (1.727 m)   Wt 106.5 kg   SpO2 100%   BMI 35.71 kg/m    I/O last 3 completed shifts: In: 6179.5 [I.V.:962; NG/GT:5217.4] Out: 2800 [Urine:2500; Stool:300] No intake/output data recorded.  SpO2: 100 % FiO2 (%): 40 %  Estimated body mass index is 35.71 kg/m as calculated from the following:   Height  as of this encounter: 5' 7.99" (1.727 m).   Weight as of this encounter: 106.5 kg.  SIGNIFICANT EVENTS   REVIEW OF SYSTEMS  PATIENT IS UNABLE TO PROVIDE COMPLETE REVIEW OF SYSTEMS DUE TO SEVERE CRITICAL ILLNESS        PHYSICAL EXAMINATION:  GENERAL:critically ill appearing, +resp distress HEAD: Normocephalic, atraumatic.  EYES: Pupils equal, round, reactive to light.  No scleral icterus.  MOUTH: Moist mucosal membrane. NECK: Supple.  PULMONARY: +rhonchi, +wheezing CARDIOVASCULAR: S1 and S2. Regular rate and rhythm. No murmurs, rubs, or gallops.  GASTROINTESTINAL: Soft,  nontender, -distended.  Positive bowel sounds.   MUSCULOSKELETAL: No swelling, clubbing, or edema.  NEUROLOGIC: obtunded, GCS<8 SKIN:intact,warm,dry  MEDICATIONS: I have reviewed all medications and confirmed regimen as documented   CULTURE RESULTS   Recent Results (from the past 240 hour(s))  Culture, respiratory     Status: None   Collection Time: 05/22/20 11:26 AM   Specimen: Tracheal Aspirate; Respiratory  Result Value Ref Range Status   Specimen Description   Final    TRACHEAL ASPIRATE Performed at [facility], [address] [place], [place] [address]    Special Requests   Final    NONE Performed at [facility], [address] [place], [place] [address]    Gram Stain   Final    RARE WBC PRESENT, PREDOMINANTLY PMN NO ORGANISMS SEEN    Culture   Final    NO GROWTH 2 DAYS Performed at [facility], [address], [place], [place] [address]    Report Status 05/24/2020 FINAL  Final          IMAGING    No results found.   Nutrition Status: Nutrition Problem: Inadequate oral intake Etiology: inability to eat Signs/Symptoms: NPO status Interventions: Prostat,Tube feeding,MVI     Indwelling Urinary Catheter continued, requirement due to   Reason to continue Indwelling Urinary Catheter strict Intake/Output monitoring for hemodynamic instability   Central Line/ continued, requirement due to  Reason to continue [employer] Monitoring of central venous pressure or other hemodynamic parameters and poor IV access   Ventilator continued, requirement due to severe respiratory failure   Ventilator Sedation RASS 0 to -2      ASSESSMENT AND PLAN SYNOPSIS   75 yo white male with acute MRSA pneumonia and MSSA bacteremia PROTEUS BACTEREMIAwith severe hyperosmolar nonketotic state and acidosis due to sepsis leading to severe and acute hypoxic resp failure with failure to wean from vent and inability to protect airway due to severe  encephalopathy.  Severe ACUTE Hypoxic and Hypercapnic Respiratory Failure -continue Full MV support -continue Bronchodilator Therapy -Wean Fio2 and PEEP as tolerated -VAP/VENT bundle implementation  ACUTE SYSTOLIC CARDIAC FAILURE- EF -oxygen as needed -Lasix as tolerated  Morbid obesity, possible OSA.   Will certainly impact respiratory mechanics, ventilator weaning Suspect will need to consider additional PEEP  ACUTE KIDNEY INJURY/Renal Failure -continue Foley Catheter-assess need -Avoid nephrotoxic agents -Follow urine output, BMP -Ensure adequate renal perfusion, optimize oxygenation -Renal dose medications     NEUROLOGY Acute toxic metabolic encephalopathy, need for sedation Goal RASS -2 to -3 Unable to obtain MRI Prolonged unresponsiveness Persisitanet vegetative state  CARDIAC ICU monitoring   GI GI PROPHYLAXIS as indicated   DIET-->TF's as tolerated Constipation protocol as indicated  ENDO - will use ICU hypoglycemic\Hyperglycemia protocol if indicated     ELECTROLYTES -follow labs as needed -replace as needed -pharmacy consultation and following   DVT/GI PRX ordered and assessed TRANSFUSIONS AS NEEDED MONITOR  FSBS I Assessed the need for Labs I Assessed the need for Foley I Assessed the need for Central Venous Line Family Discussion when available I Assessed the need for Mobilization I made an Assessment of medications to be adjusted accordingly Safety Risk assessment completed   CASE DISCUSSED IN MULTIDISCIPLINARY ROUNDS WITH ICU TEAM  Critical Care Time devoted to patient care services described in this note is 55  minutes.   Overall, patient is critically ill, prognosis is guarded.  Patient with Multiorgan failure and at high risk for cardiac arrest and death.   Needs Trach and PEG tube for survival  Lucie Leather, M.D.  [facility]  Medical Director [facility] Medical Director [facility]  Cardio-Pulmonary Department

## 2020-06-01 DEATH — deceased

## 2021-05-16 IMAGING — CT CT HEAD W/O CM
3 of 6 series · 15 of 47 positions shown, 18 images · non-contrast
Comparison: CT head 05/13/2020

CLINICAL DATA: Mental status change

EXAM:
CT HEAD WITHOUT CONTRAST
TECHNIQUE: Contiguous axial images were obtained from the base of the skull
through the vertex without intravenous contrast.

[Series 2: head wo · axial · 0.46mm/px · z∈[-134,-4]mm · 10 of 32 slices shown, 13 images]
[im 3/32  brain]
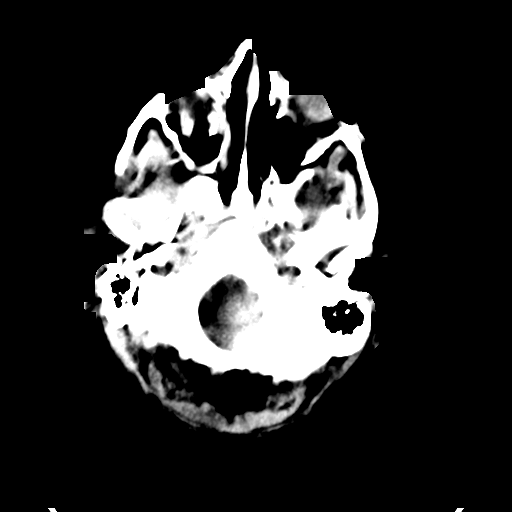
[im 3/32  bone]
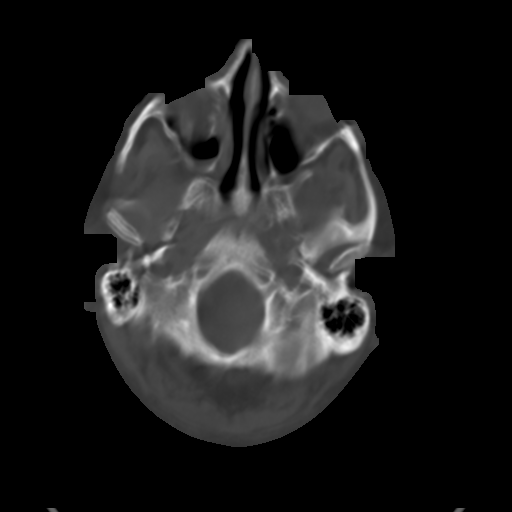
[im 5/32  brain]
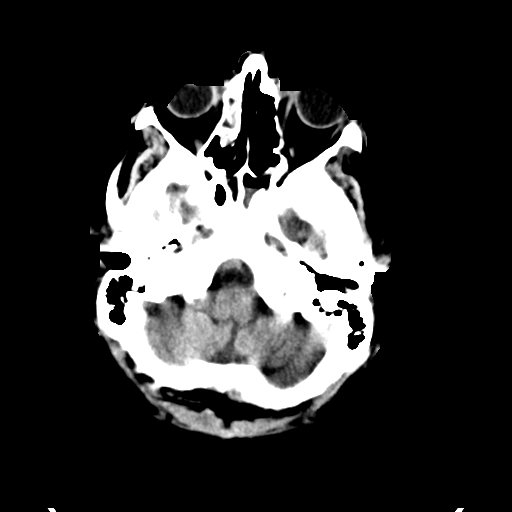
[im 9/32  brain]
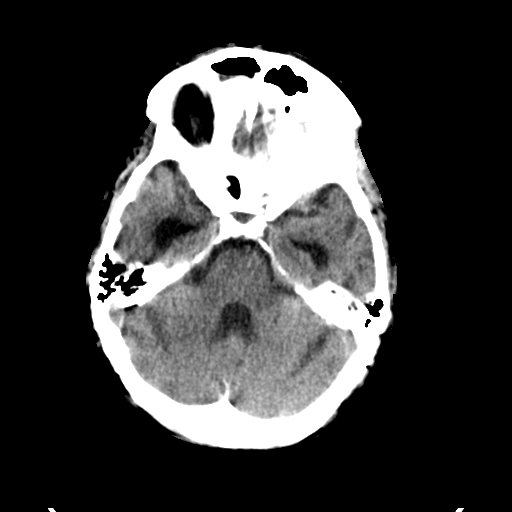
[im 12/32  brain]
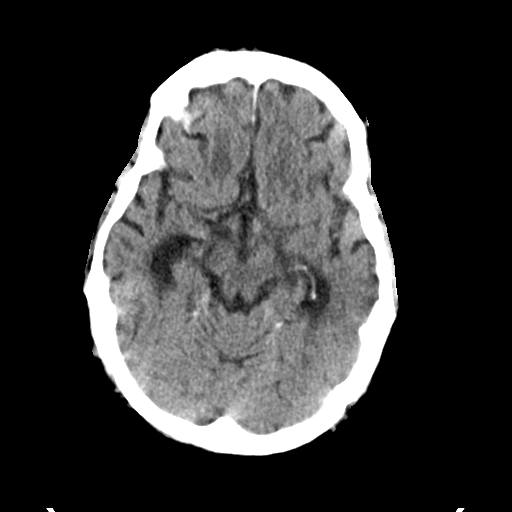
[im 14/32  brain]
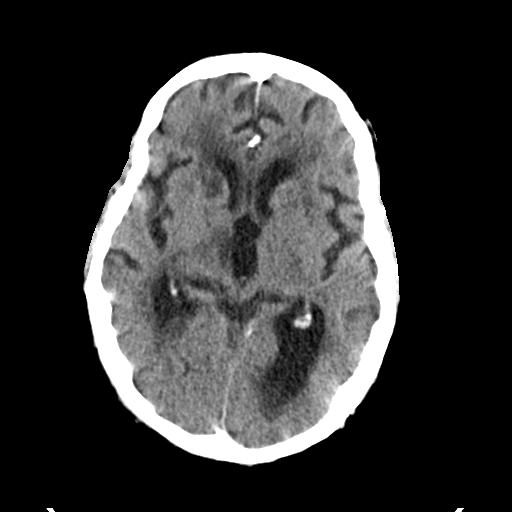
[im 14/32  bone]
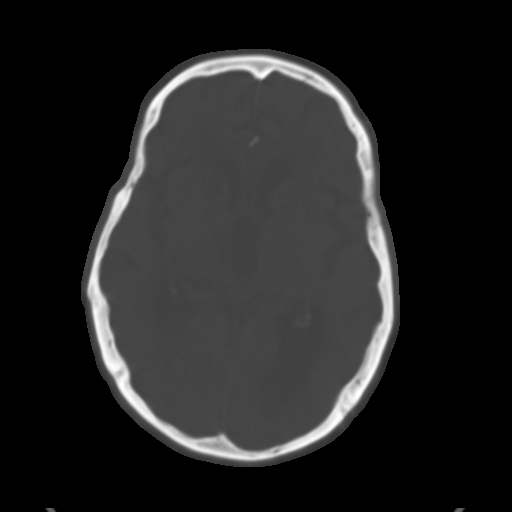
[im 18/32  brain]
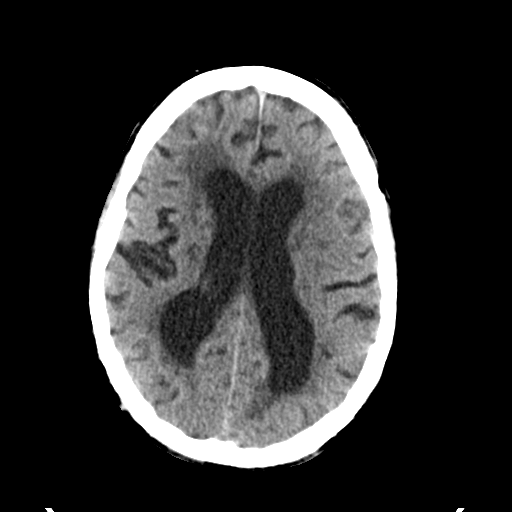
[im 20/32  brain]
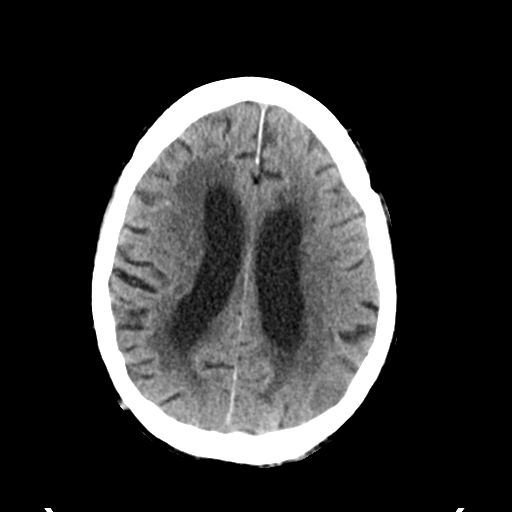
[im 23/32  brain]
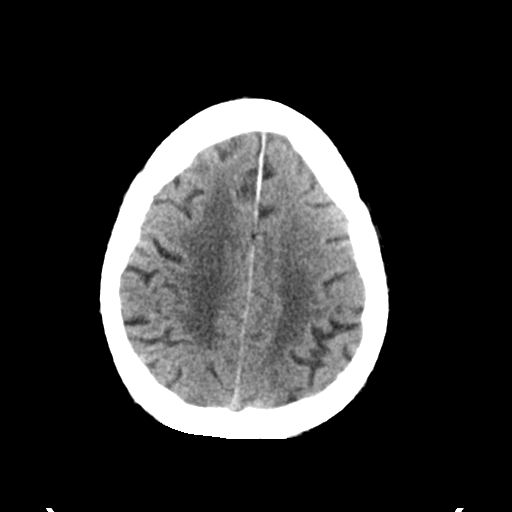
[im 27/32  brain]
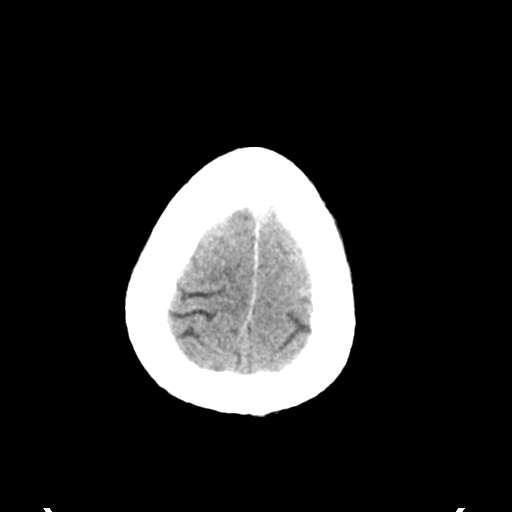
[im 27/32  bone]
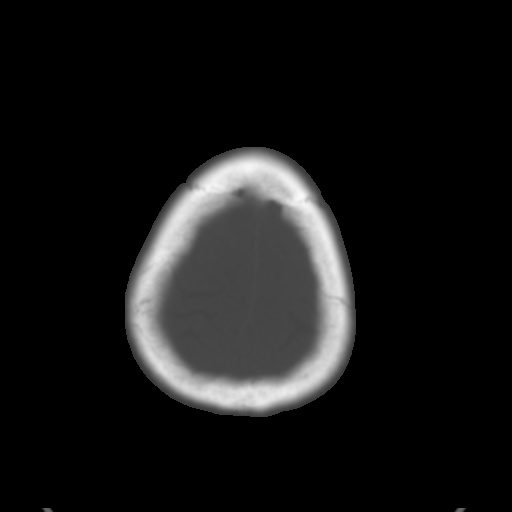
[im 29/32  brain]
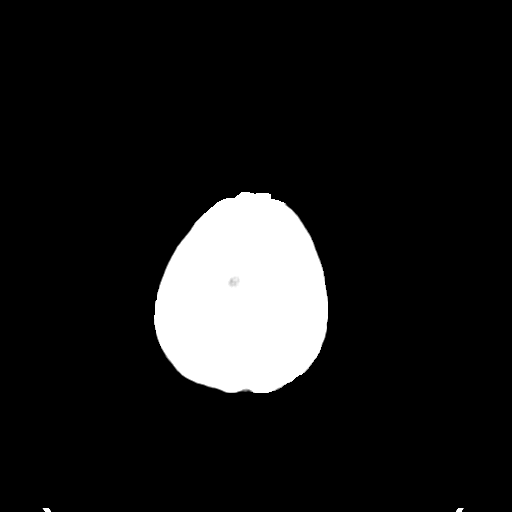

[Series 6: coronal soft tissue · coronal · 0.29mm/px · 3 of 68 slices shown]
[im 17/68  brain]
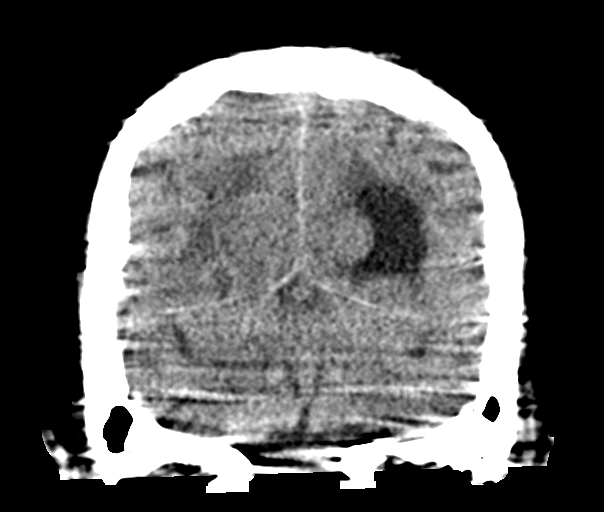
[im 34/68  brain]
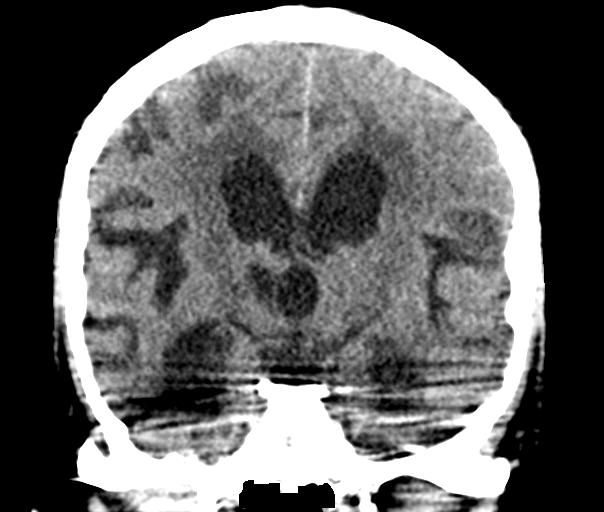
[im 51/68  brain]
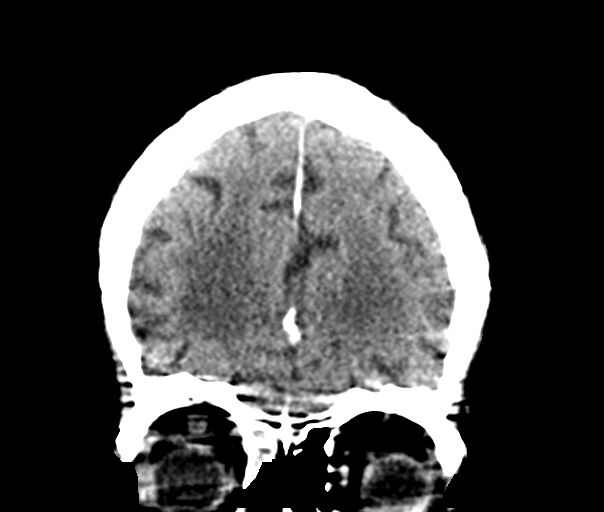

[Series 9: sagittal soft tissue · sagittal · 0.29mm/px · 2 of 57 slices shown]
[im 19/57  brain]
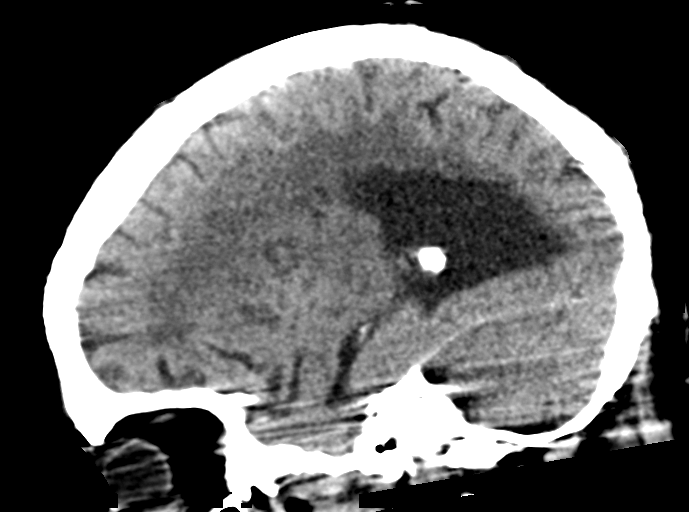
[im 38/57  brain]
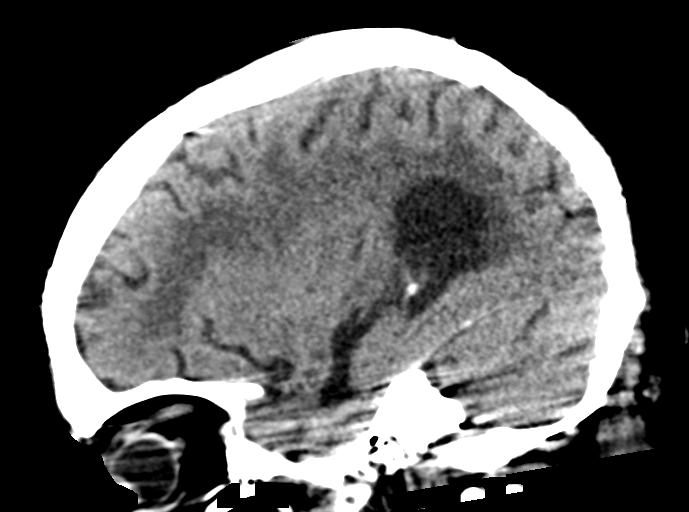

[15 of 47 positions shown; findings below may reference images not displayed]

FINDINGS: Brain: Motion degraded study.  Multiple repeat images.

Moderate atrophy. Extensive chronic microvascular ischemic change
throughout the cerebral white matter. Chronic infarct in the right
thalamus unchanged.

Vascular: Negative for hyperdense vessel

Skull: Negative

Sinuses/Orbits: Mucosal edema paranasal sinuses. Air-fluid level
right frontal sinus and right maxillary sinus

Other: None
IMPRESSION: Motion degraded study

No acute abnormality

Extensive chronic microvascular ischemic change.

## 2021-05-18 IMAGING — DX DG CHEST 1V PORT
1 series · 1 of 1 positions shown · non-contrast
Comparison: 05/19/2020

CLINICAL DATA: Vomiting

EXAM:
PORTABLE CHEST 1 VIEW

[chest ap]
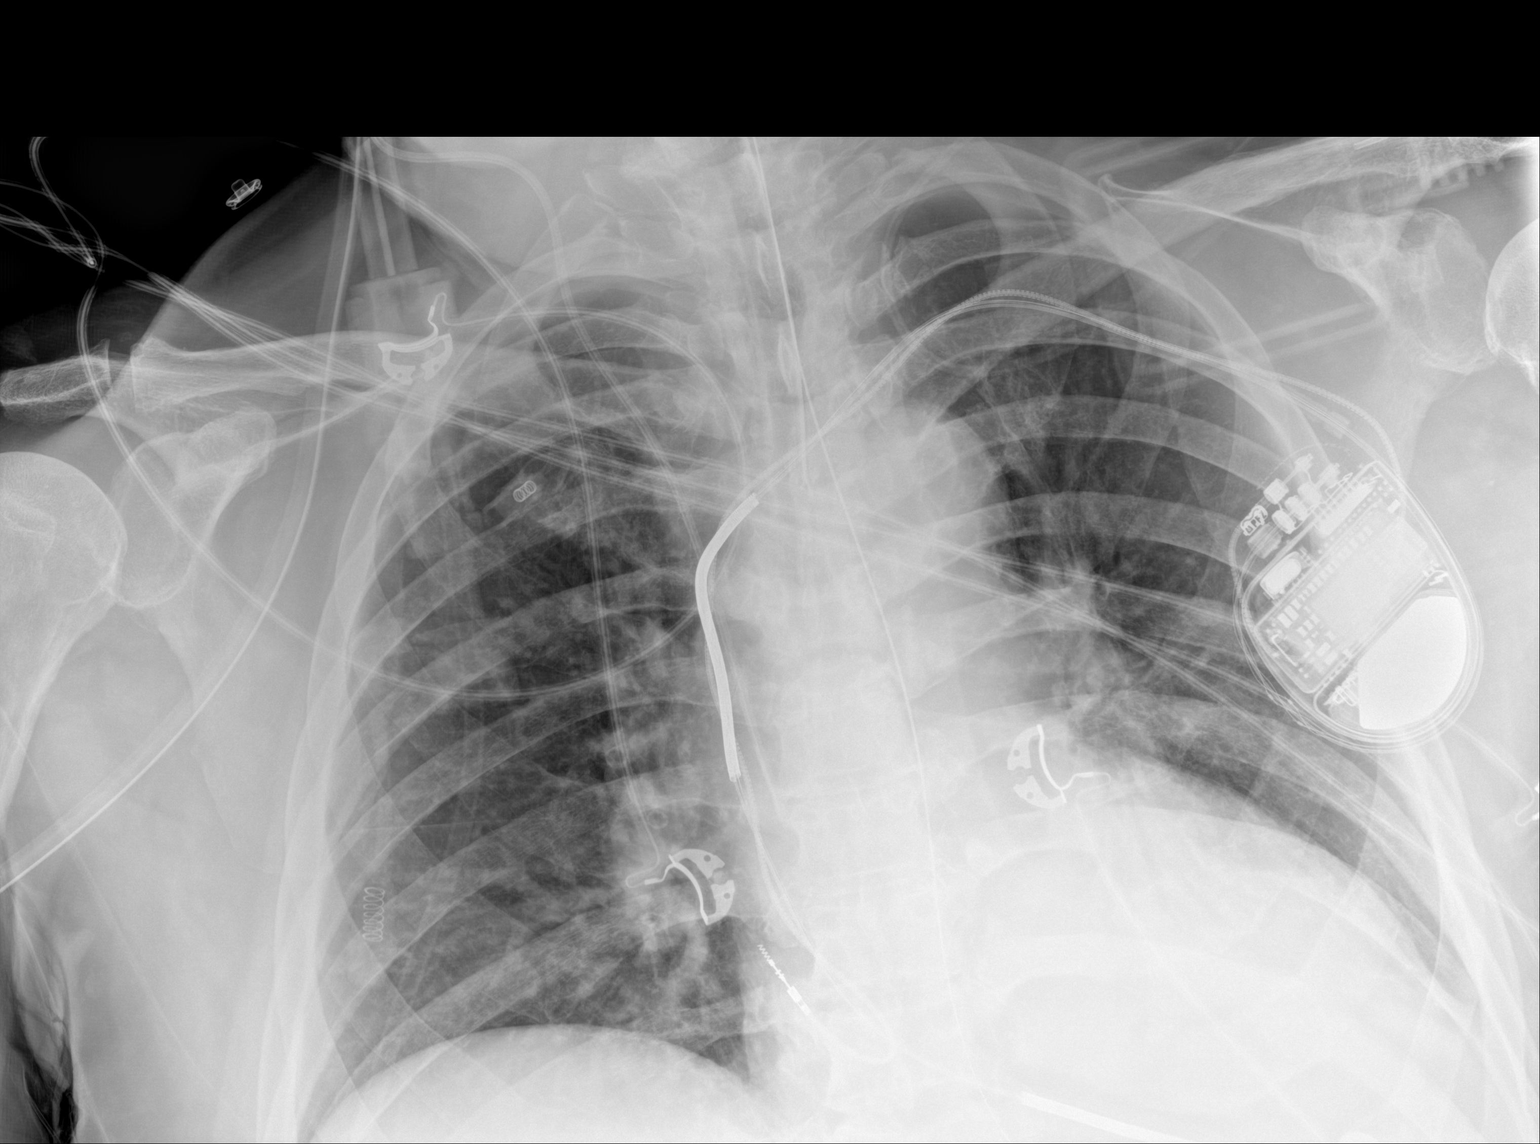

[1 of 1 positions shown; findings below may reference images not displayed]

FINDINGS: Endotracheal tube seen 4.6 cm above the carina. Nasogastric tube
extends into the upper abdomen beyond the margin of the examination.
Right internal jugular central venous catheter tip noted at the
superior cavoatrial junction.

Progressive retrocardiac opacification may relate to atelectasis or
developing infiltrate within this region. The lungs are otherwise
clear. No pneumothorax or pleural effusion. Cardiac size within
normal limits. Left subclavian pacemaker defibrillator is unchanged.
The pulmonary vascularity is normal.
IMPRESSION: Stable support lines and tubes.

Progressive retrocardiac opacification, possibly representing
developing atelectasis or infiltrate within this region.
